# Patient Record
Sex: Female | Born: 1935 | Race: White | Hispanic: No | Smoking: Former smoker
Health system: Southern US, Community
[De-identification: ages and names within clinical notes are randomized; demographics above are authoritative.]

## PROBLEM LIST (undated history)

## (undated) DIAGNOSIS — I10 Essential (primary) hypertension: Secondary | ICD-10-CM

## (undated) DIAGNOSIS — M419 Scoliosis, unspecified: Secondary | ICD-10-CM

## (undated) DIAGNOSIS — S72142A Displaced intertrochanteric fracture of left femur, initial encounter for closed fracture: Secondary | ICD-10-CM

## (undated) DIAGNOSIS — K219 Gastro-esophageal reflux disease without esophagitis: Secondary | ICD-10-CM

## (undated) DIAGNOSIS — M810 Age-related osteoporosis without current pathological fracture: Secondary | ICD-10-CM

## (undated) HISTORY — PX: CATARACT EXTRACTION: SUR2

## (undated) HISTORY — PX: COLONOSCOPY: SHX174

## (undated) HISTORY — PX: UPPER GASTROINTESTINAL ENDOSCOPY: SHX188

## (undated) HISTORY — PX: DILATION AND CURETTAGE OF UTERUS: SHX78

## (undated) HISTORY — PX: EYE SURGERY: SHX253

## (undated) HISTORY — PX: TONSILLECTOMY: SUR1361

---

## 2002-01-08 ENCOUNTER — Encounter: Admission: RE | Admit: 2002-01-08 | Discharge: 2002-01-08 | Payer: Self-pay | Admitting: Orthopedic Surgery

## 2002-01-08 ENCOUNTER — Encounter: Payer: Self-pay | Admitting: Orthopedic Surgery

## 2002-01-18 ENCOUNTER — Encounter: Payer: Self-pay | Admitting: Orthopedic Surgery

## 2002-01-18 ENCOUNTER — Encounter: Admission: RE | Admit: 2002-01-18 | Discharge: 2002-01-18 | Payer: Self-pay | Admitting: Orthopedic Surgery

## 2002-02-05 ENCOUNTER — Encounter: Admission: RE | Admit: 2002-02-05 | Discharge: 2002-02-05 | Payer: Self-pay | Admitting: Orthopedic Surgery

## 2002-02-05 ENCOUNTER — Encounter: Payer: Self-pay | Admitting: Orthopedic Surgery

## 2015-11-30 DIAGNOSIS — H34811 Central retinal vein occlusion, right eye, with macular edema: Secondary | ICD-10-CM | POA: Diagnosis not present

## 2015-12-04 DIAGNOSIS — I10 Essential (primary) hypertension: Secondary | ICD-10-CM | POA: Diagnosis not present

## 2015-12-04 DIAGNOSIS — Z6824 Body mass index (BMI) 24.0-24.9, adult: Secondary | ICD-10-CM | POA: Diagnosis not present

## 2016-01-11 DIAGNOSIS — H34811 Central retinal vein occlusion, right eye, with macular edema: Secondary | ICD-10-CM | POA: Diagnosis not present

## 2016-02-15 DIAGNOSIS — H34811 Central retinal vein occlusion, right eye, with macular edema: Secondary | ICD-10-CM | POA: Diagnosis not present

## 2016-03-05 DIAGNOSIS — K210 Gastro-esophageal reflux disease with esophagitis: Secondary | ICD-10-CM | POA: Diagnosis not present

## 2016-03-05 DIAGNOSIS — Z6823 Body mass index (BMI) 23.0-23.9, adult: Secondary | ICD-10-CM | POA: Diagnosis not present

## 2016-03-05 DIAGNOSIS — I10 Essential (primary) hypertension: Secondary | ICD-10-CM | POA: Diagnosis not present

## 2016-03-05 DIAGNOSIS — M818 Other osteoporosis without current pathological fracture: Secondary | ICD-10-CM | POA: Diagnosis not present

## 2016-03-28 DIAGNOSIS — H34811 Central retinal vein occlusion, right eye, with macular edema: Secondary | ICD-10-CM | POA: Diagnosis not present

## 2016-05-16 DIAGNOSIS — H34811 Central retinal vein occlusion, right eye, with macular edema: Secondary | ICD-10-CM | POA: Diagnosis not present

## 2016-06-02 DIAGNOSIS — W1839XA Other fall on same level, initial encounter: Secondary | ICD-10-CM | POA: Diagnosis not present

## 2016-06-02 DIAGNOSIS — K219 Gastro-esophageal reflux disease without esophagitis: Secondary | ICD-10-CM | POA: Diagnosis not present

## 2016-06-02 DIAGNOSIS — Z79899 Other long term (current) drug therapy: Secondary | ICD-10-CM | POA: Diagnosis not present

## 2016-06-02 DIAGNOSIS — S32591A Other specified fracture of right pubis, initial encounter for closed fracture: Secondary | ICD-10-CM | POA: Diagnosis not present

## 2016-06-02 DIAGNOSIS — Z7982 Long term (current) use of aspirin: Secondary | ICD-10-CM | POA: Diagnosis not present

## 2016-06-02 DIAGNOSIS — M810 Age-related osteoporosis without current pathological fracture: Secondary | ICD-10-CM | POA: Diagnosis not present

## 2016-06-02 DIAGNOSIS — M25511 Pain in right shoulder: Secondary | ICD-10-CM | POA: Diagnosis not present

## 2016-06-02 DIAGNOSIS — F17200 Nicotine dependence, unspecified, uncomplicated: Secondary | ICD-10-CM | POA: Diagnosis not present

## 2016-06-02 DIAGNOSIS — S32512A Fracture of superior rim of left pubis, initial encounter for closed fracture: Secondary | ICD-10-CM | POA: Diagnosis not present

## 2016-06-02 DIAGNOSIS — I10 Essential (primary) hypertension: Secondary | ICD-10-CM | POA: Diagnosis not present

## 2016-06-02 DIAGNOSIS — S32511A Fracture of superior rim of right pubis, initial encounter for closed fracture: Secondary | ICD-10-CM | POA: Diagnosis not present

## 2016-06-06 DIAGNOSIS — S32591A Other specified fracture of right pubis, initial encounter for closed fracture: Secondary | ICD-10-CM | POA: Diagnosis not present

## 2016-06-06 DIAGNOSIS — I10 Essential (primary) hypertension: Secondary | ICD-10-CM | POA: Diagnosis not present

## 2016-06-06 DIAGNOSIS — K210 Gastro-esophageal reflux disease with esophagitis: Secondary | ICD-10-CM | POA: Diagnosis not present

## 2016-06-06 DIAGNOSIS — Z6823 Body mass index (BMI) 23.0-23.9, adult: Secondary | ICD-10-CM | POA: Diagnosis not present

## 2016-07-04 DIAGNOSIS — H34811 Central retinal vein occlusion, right eye, with macular edema: Secondary | ICD-10-CM | POA: Diagnosis not present

## 2016-07-18 DIAGNOSIS — Z23 Encounter for immunization: Secondary | ICD-10-CM | POA: Diagnosis not present

## 2016-08-29 DIAGNOSIS — H35351 Cystoid macular degeneration, right eye: Secondary | ICD-10-CM | POA: Diagnosis not present

## 2016-08-29 DIAGNOSIS — H34811 Central retinal vein occlusion, right eye, with macular edema: Secondary | ICD-10-CM | POA: Diagnosis not present

## 2016-09-05 DIAGNOSIS — Z1389 Encounter for screening for other disorder: Secondary | ICD-10-CM | POA: Diagnosis not present

## 2016-09-05 DIAGNOSIS — K210 Gastro-esophageal reflux disease with esophagitis: Secondary | ICD-10-CM | POA: Diagnosis not present

## 2016-09-05 DIAGNOSIS — Z6823 Body mass index (BMI) 23.0-23.9, adult: Secondary | ICD-10-CM | POA: Diagnosis not present

## 2016-09-05 DIAGNOSIS — Z0000 Encounter for general adult medical examination without abnormal findings: Secondary | ICD-10-CM | POA: Diagnosis not present

## 2016-09-05 DIAGNOSIS — M25511 Pain in right shoulder: Secondary | ICD-10-CM | POA: Diagnosis not present

## 2016-09-05 DIAGNOSIS — I10 Essential (primary) hypertension: Secondary | ICD-10-CM | POA: Diagnosis not present

## 2016-09-24 DIAGNOSIS — Z23 Encounter for immunization: Secondary | ICD-10-CM | POA: Diagnosis not present

## 2016-10-31 DIAGNOSIS — H34811 Central retinal vein occlusion, right eye, with macular edema: Secondary | ICD-10-CM | POA: Diagnosis not present

## 2016-12-05 DIAGNOSIS — Z6823 Body mass index (BMI) 23.0-23.9, adult: Secondary | ICD-10-CM | POA: Diagnosis not present

## 2016-12-05 DIAGNOSIS — I10 Essential (primary) hypertension: Secondary | ICD-10-CM | POA: Diagnosis not present

## 2016-12-05 DIAGNOSIS — K210 Gastro-esophageal reflux disease with esophagitis: Secondary | ICD-10-CM | POA: Diagnosis not present

## 2016-12-05 DIAGNOSIS — M25511 Pain in right shoulder: Secondary | ICD-10-CM | POA: Diagnosis not present

## 2017-01-02 DIAGNOSIS — H34811 Central retinal vein occlusion, right eye, with macular edema: Secondary | ICD-10-CM | POA: Diagnosis not present

## 2017-02-12 DIAGNOSIS — M25511 Pain in right shoulder: Secondary | ICD-10-CM | POA: Diagnosis not present

## 2017-02-27 DIAGNOSIS — H26491 Other secondary cataract, right eye: Secondary | ICD-10-CM | POA: Diagnosis not present

## 2017-02-27 DIAGNOSIS — H353132 Nonexudative age-related macular degeneration, bilateral, intermediate dry stage: Secondary | ICD-10-CM | POA: Diagnosis not present

## 2017-02-27 DIAGNOSIS — H348112 Central retinal vein occlusion, right eye, stable: Secondary | ICD-10-CM | POA: Diagnosis not present

## 2017-02-27 DIAGNOSIS — H35351 Cystoid macular degeneration, right eye: Secondary | ICD-10-CM | POA: Diagnosis not present

## 2017-03-07 DIAGNOSIS — I10 Essential (primary) hypertension: Secondary | ICD-10-CM | POA: Diagnosis not present

## 2017-03-07 DIAGNOSIS — M25511 Pain in right shoulder: Secondary | ICD-10-CM | POA: Diagnosis not present

## 2017-03-07 DIAGNOSIS — Z6823 Body mass index (BMI) 23.0-23.9, adult: Secondary | ICD-10-CM | POA: Diagnosis not present

## 2017-03-07 DIAGNOSIS — K210 Gastro-esophageal reflux disease with esophagitis: Secondary | ICD-10-CM | POA: Diagnosis not present

## 2017-03-24 DIAGNOSIS — M19012 Primary osteoarthritis, left shoulder: Secondary | ICD-10-CM | POA: Diagnosis not present

## 2017-03-24 DIAGNOSIS — M19011 Primary osteoarthritis, right shoulder: Secondary | ICD-10-CM | POA: Diagnosis not present

## 2017-05-29 DIAGNOSIS — H353132 Nonexudative age-related macular degeneration, bilateral, intermediate dry stage: Secondary | ICD-10-CM | POA: Diagnosis not present

## 2017-05-29 DIAGNOSIS — H35351 Cystoid macular degeneration, right eye: Secondary | ICD-10-CM | POA: Diagnosis not present

## 2017-05-29 DIAGNOSIS — H348112 Central retinal vein occlusion, right eye, stable: Secondary | ICD-10-CM | POA: Diagnosis not present

## 2017-05-29 DIAGNOSIS — H26491 Other secondary cataract, right eye: Secondary | ICD-10-CM | POA: Diagnosis not present

## 2017-06-06 DIAGNOSIS — M25512 Pain in left shoulder: Secondary | ICD-10-CM | POA: Diagnosis not present

## 2017-06-06 DIAGNOSIS — K210 Gastro-esophageal reflux disease with esophagitis: Secondary | ICD-10-CM | POA: Diagnosis not present

## 2017-06-06 DIAGNOSIS — Z6824 Body mass index (BMI) 24.0-24.9, adult: Secondary | ICD-10-CM | POA: Diagnosis not present

## 2017-06-06 DIAGNOSIS — I10 Essential (primary) hypertension: Secondary | ICD-10-CM | POA: Diagnosis not present

## 2017-06-19 DIAGNOSIS — H35351 Cystoid macular degeneration, right eye: Secondary | ICD-10-CM | POA: Diagnosis not present

## 2017-06-19 DIAGNOSIS — H353132 Nonexudative age-related macular degeneration, bilateral, intermediate dry stage: Secondary | ICD-10-CM | POA: Diagnosis not present

## 2017-06-19 DIAGNOSIS — H26491 Other secondary cataract, right eye: Secondary | ICD-10-CM | POA: Diagnosis not present

## 2017-06-19 DIAGNOSIS — H34811 Central retinal vein occlusion, right eye, with macular edema: Secondary | ICD-10-CM | POA: Diagnosis not present

## 2017-07-31 DIAGNOSIS — H353132 Nonexudative age-related macular degeneration, bilateral, intermediate dry stage: Secondary | ICD-10-CM | POA: Diagnosis not present

## 2017-07-31 DIAGNOSIS — H35351 Cystoid macular degeneration, right eye: Secondary | ICD-10-CM | POA: Diagnosis not present

## 2017-07-31 DIAGNOSIS — H34811 Central retinal vein occlusion, right eye, with macular edema: Secondary | ICD-10-CM | POA: Diagnosis not present

## 2017-09-04 DIAGNOSIS — K210 Gastro-esophageal reflux disease with esophagitis: Secondary | ICD-10-CM | POA: Diagnosis not present

## 2017-09-04 DIAGNOSIS — I10 Essential (primary) hypertension: Secondary | ICD-10-CM | POA: Diagnosis not present

## 2017-09-04 DIAGNOSIS — M25511 Pain in right shoulder: Secondary | ICD-10-CM | POA: Diagnosis not present

## 2017-09-04 DIAGNOSIS — M25512 Pain in left shoulder: Secondary | ICD-10-CM | POA: Diagnosis not present

## 2017-09-25 DIAGNOSIS — H3561 Retinal hemorrhage, right eye: Secondary | ICD-10-CM | POA: Diagnosis not present

## 2017-09-25 DIAGNOSIS — H353132 Nonexudative age-related macular degeneration, bilateral, intermediate dry stage: Secondary | ICD-10-CM | POA: Diagnosis not present

## 2017-09-25 DIAGNOSIS — H35351 Cystoid macular degeneration, right eye: Secondary | ICD-10-CM | POA: Diagnosis not present

## 2017-09-25 DIAGNOSIS — H34811 Central retinal vein occlusion, right eye, with macular edema: Secondary | ICD-10-CM | POA: Diagnosis not present

## 2017-10-01 DIAGNOSIS — Z23 Encounter for immunization: Secondary | ICD-10-CM | POA: Diagnosis not present

## 2017-11-06 DIAGNOSIS — H3561 Retinal hemorrhage, right eye: Secondary | ICD-10-CM | POA: Diagnosis not present

## 2017-11-06 DIAGNOSIS — H34811 Central retinal vein occlusion, right eye, with macular edema: Secondary | ICD-10-CM | POA: Diagnosis not present

## 2017-11-06 DIAGNOSIS — H35351 Cystoid macular degeneration, right eye: Secondary | ICD-10-CM | POA: Diagnosis not present

## 2017-12-10 DIAGNOSIS — K210 Gastro-esophageal reflux disease with esophagitis: Secondary | ICD-10-CM | POA: Diagnosis not present

## 2017-12-10 DIAGNOSIS — I10 Essential (primary) hypertension: Secondary | ICD-10-CM | POA: Diagnosis not present

## 2017-12-10 DIAGNOSIS — M25512 Pain in left shoulder: Secondary | ICD-10-CM | POA: Diagnosis not present

## 2017-12-10 DIAGNOSIS — M810 Age-related osteoporosis without current pathological fracture: Secondary | ICD-10-CM | POA: Diagnosis not present

## 2017-12-18 DIAGNOSIS — H35351 Cystoid macular degeneration, right eye: Secondary | ICD-10-CM | POA: Diagnosis not present

## 2017-12-18 DIAGNOSIS — H34811 Central retinal vein occlusion, right eye, with macular edema: Secondary | ICD-10-CM | POA: Diagnosis not present

## 2017-12-18 DIAGNOSIS — H3561 Retinal hemorrhage, right eye: Secondary | ICD-10-CM | POA: Diagnosis not present

## 2017-12-18 DIAGNOSIS — H353132 Nonexudative age-related macular degeneration, bilateral, intermediate dry stage: Secondary | ICD-10-CM | POA: Diagnosis not present

## 2018-01-22 DIAGNOSIS — M85851 Other specified disorders of bone density and structure, right thigh: Secondary | ICD-10-CM | POA: Diagnosis not present

## 2018-02-05 DIAGNOSIS — H348112 Central retinal vein occlusion, right eye, stable: Secondary | ICD-10-CM | POA: Diagnosis not present

## 2018-02-05 DIAGNOSIS — H35351 Cystoid macular degeneration, right eye: Secondary | ICD-10-CM | POA: Diagnosis not present

## 2018-02-05 DIAGNOSIS — H353132 Nonexudative age-related macular degeneration, bilateral, intermediate dry stage: Secondary | ICD-10-CM | POA: Diagnosis not present

## 2018-02-05 DIAGNOSIS — Z87891 Personal history of nicotine dependence: Secondary | ICD-10-CM | POA: Diagnosis not present

## 2018-02-12 DIAGNOSIS — Z1231 Encounter for screening mammogram for malignant neoplasm of breast: Secondary | ICD-10-CM | POA: Diagnosis not present

## 2018-03-12 DIAGNOSIS — K210 Gastro-esophageal reflux disease with esophagitis: Secondary | ICD-10-CM | POA: Diagnosis not present

## 2018-03-12 DIAGNOSIS — I10 Essential (primary) hypertension: Secondary | ICD-10-CM | POA: Diagnosis not present

## 2018-03-12 DIAGNOSIS — M25512 Pain in left shoulder: Secondary | ICD-10-CM | POA: Diagnosis not present

## 2018-03-12 DIAGNOSIS — M810 Age-related osteoporosis without current pathological fracture: Secondary | ICD-10-CM | POA: Diagnosis not present

## 2018-03-19 DIAGNOSIS — H35351 Cystoid macular degeneration, right eye: Secondary | ICD-10-CM | POA: Diagnosis not present

## 2018-03-19 DIAGNOSIS — H34811 Central retinal vein occlusion, right eye, with macular edema: Secondary | ICD-10-CM | POA: Diagnosis not present

## 2018-03-19 DIAGNOSIS — H3561 Retinal hemorrhage, right eye: Secondary | ICD-10-CM | POA: Diagnosis not present

## 2018-05-07 DIAGNOSIS — H34811 Central retinal vein occlusion, right eye, with macular edema: Secondary | ICD-10-CM | POA: Diagnosis not present

## 2018-05-07 DIAGNOSIS — H3561 Retinal hemorrhage, right eye: Secondary | ICD-10-CM | POA: Diagnosis not present

## 2018-05-07 DIAGNOSIS — H35351 Cystoid macular degeneration, right eye: Secondary | ICD-10-CM | POA: Diagnosis not present

## 2018-05-07 DIAGNOSIS — H353132 Nonexudative age-related macular degeneration, bilateral, intermediate dry stage: Secondary | ICD-10-CM | POA: Diagnosis not present

## 2018-06-11 DIAGNOSIS — I10 Essential (primary) hypertension: Secondary | ICD-10-CM | POA: Diagnosis not present

## 2018-06-11 DIAGNOSIS — Z6824 Body mass index (BMI) 24.0-24.9, adult: Secondary | ICD-10-CM | POA: Diagnosis not present

## 2018-06-11 DIAGNOSIS — Z0000 Encounter for general adult medical examination without abnormal findings: Secondary | ICD-10-CM | POA: Diagnosis not present

## 2018-06-11 DIAGNOSIS — Z6823 Body mass index (BMI) 23.0-23.9, adult: Secondary | ICD-10-CM | POA: Diagnosis not present

## 2018-06-11 DIAGNOSIS — M810 Age-related osteoporosis without current pathological fracture: Secondary | ICD-10-CM | POA: Diagnosis not present

## 2018-07-02 DIAGNOSIS — H3561 Retinal hemorrhage, right eye: Secondary | ICD-10-CM | POA: Diagnosis not present

## 2018-07-02 DIAGNOSIS — H35351 Cystoid macular degeneration, right eye: Secondary | ICD-10-CM | POA: Diagnosis not present

## 2018-07-02 DIAGNOSIS — H34811 Central retinal vein occlusion, right eye, with macular edema: Secondary | ICD-10-CM | POA: Diagnosis not present

## 2018-07-10 DIAGNOSIS — Z23 Encounter for immunization: Secondary | ICD-10-CM | POA: Diagnosis not present

## 2018-09-03 DIAGNOSIS — H35351 Cystoid macular degeneration, right eye: Secondary | ICD-10-CM | POA: Diagnosis not present

## 2018-09-03 DIAGNOSIS — H34811 Central retinal vein occlusion, right eye, with macular edema: Secondary | ICD-10-CM | POA: Diagnosis not present

## 2018-09-03 DIAGNOSIS — H3561 Retinal hemorrhage, right eye: Secondary | ICD-10-CM | POA: Diagnosis not present

## 2018-09-03 DIAGNOSIS — H353132 Nonexudative age-related macular degeneration, bilateral, intermediate dry stage: Secondary | ICD-10-CM | POA: Diagnosis not present

## 2018-09-10 DIAGNOSIS — Z6823 Body mass index (BMI) 23.0-23.9, adult: Secondary | ICD-10-CM | POA: Diagnosis not present

## 2018-09-10 DIAGNOSIS — S46812A Strain of other muscles, fascia and tendons at shoulder and upper arm level, left arm, initial encounter: Secondary | ICD-10-CM | POA: Diagnosis not present

## 2018-09-10 DIAGNOSIS — Z6824 Body mass index (BMI) 24.0-24.9, adult: Secondary | ICD-10-CM | POA: Diagnosis not present

## 2018-09-10 DIAGNOSIS — I10 Essential (primary) hypertension: Secondary | ICD-10-CM | POA: Diagnosis not present

## 2018-09-10 DIAGNOSIS — R5383 Other fatigue: Secondary | ICD-10-CM | POA: Diagnosis not present

## 2018-09-10 DIAGNOSIS — Z0000 Encounter for general adult medical examination without abnormal findings: Secondary | ICD-10-CM | POA: Diagnosis not present

## 2018-10-25 DIAGNOSIS — S0990XA Unspecified injury of head, initial encounter: Secondary | ICD-10-CM | POA: Diagnosis not present

## 2018-10-25 DIAGNOSIS — M419 Scoliosis, unspecified: Secondary | ICD-10-CM | POA: Diagnosis not present

## 2018-10-25 DIAGNOSIS — W010XXA Fall on same level from slipping, tripping and stumbling without subsequent striking against object, initial encounter: Secondary | ICD-10-CM | POA: Diagnosis not present

## 2018-10-25 DIAGNOSIS — W01198A Fall on same level from slipping, tripping and stumbling with subsequent striking against other object, initial encounter: Secondary | ICD-10-CM | POA: Diagnosis not present

## 2018-10-25 DIAGNOSIS — K219 Gastro-esophageal reflux disease without esophagitis: Secondary | ICD-10-CM | POA: Diagnosis not present

## 2018-10-25 DIAGNOSIS — R0789 Other chest pain: Secondary | ICD-10-CM | POA: Diagnosis not present

## 2018-10-25 DIAGNOSIS — R531 Weakness: Secondary | ICD-10-CM | POA: Diagnosis not present

## 2018-10-25 DIAGNOSIS — R0602 Shortness of breath: Secondary | ICD-10-CM | POA: Diagnosis not present

## 2018-10-25 DIAGNOSIS — K449 Diaphragmatic hernia without obstruction or gangrene: Secondary | ICD-10-CM | POA: Diagnosis not present

## 2018-10-25 DIAGNOSIS — S199XXA Unspecified injury of neck, initial encounter: Secondary | ICD-10-CM | POA: Diagnosis not present

## 2018-10-25 DIAGNOSIS — G8929 Other chronic pain: Secondary | ICD-10-CM | POA: Diagnosis not present

## 2018-10-25 DIAGNOSIS — R51 Headache: Secondary | ICD-10-CM | POA: Diagnosis not present

## 2018-10-25 DIAGNOSIS — Y92481 Parking lot as the place of occurrence of the external cause: Secondary | ICD-10-CM | POA: Diagnosis not present

## 2018-10-25 DIAGNOSIS — M810 Age-related osteoporosis without current pathological fracture: Secondary | ICD-10-CM | POA: Diagnosis not present

## 2018-10-25 DIAGNOSIS — Z780 Asymptomatic menopausal state: Secondary | ICD-10-CM | POA: Diagnosis not present

## 2018-10-25 DIAGNOSIS — M25511 Pain in right shoulder: Secondary | ICD-10-CM | POA: Diagnosis not present

## 2018-10-25 DIAGNOSIS — S40012A Contusion of left shoulder, initial encounter: Secondary | ICD-10-CM | POA: Diagnosis not present

## 2018-10-25 DIAGNOSIS — M545 Low back pain: Secondary | ICD-10-CM | POA: Diagnosis not present

## 2018-10-25 DIAGNOSIS — R079 Chest pain, unspecified: Secondary | ICD-10-CM | POA: Diagnosis not present

## 2018-10-25 DIAGNOSIS — M25512 Pain in left shoulder: Secondary | ICD-10-CM | POA: Diagnosis not present

## 2018-10-25 DIAGNOSIS — I10 Essential (primary) hypertension: Secondary | ICD-10-CM | POA: Diagnosis not present

## 2018-10-25 DIAGNOSIS — S299XXA Unspecified injury of thorax, initial encounter: Secondary | ICD-10-CM | POA: Diagnosis not present

## 2018-10-25 DIAGNOSIS — S2242XA Multiple fractures of ribs, left side, initial encounter for closed fracture: Secondary | ICD-10-CM | POA: Diagnosis not present

## 2018-10-25 DIAGNOSIS — Z79899 Other long term (current) drug therapy: Secondary | ICD-10-CM | POA: Diagnosis not present

## 2018-10-26 DIAGNOSIS — W010XXA Fall on same level from slipping, tripping and stumbling without subsequent striking against object, initial encounter: Secondary | ICD-10-CM | POA: Diagnosis not present

## 2018-10-26 DIAGNOSIS — Y92481 Parking lot as the place of occurrence of the external cause: Secondary | ICD-10-CM | POA: Diagnosis not present

## 2018-10-26 DIAGNOSIS — S2242XA Multiple fractures of ribs, left side, initial encounter for closed fracture: Secondary | ICD-10-CM | POA: Diagnosis not present

## 2018-10-26 DIAGNOSIS — I10 Essential (primary) hypertension: Secondary | ICD-10-CM | POA: Diagnosis not present

## 2018-11-02 DIAGNOSIS — Z6824 Body mass index (BMI) 24.0-24.9, adult: Secondary | ICD-10-CM | POA: Diagnosis not present

## 2018-11-02 DIAGNOSIS — S2243XA Multiple fractures of ribs, bilateral, initial encounter for closed fracture: Secondary | ICD-10-CM | POA: Diagnosis not present

## 2018-11-10 DIAGNOSIS — H3561 Retinal hemorrhage, right eye: Secondary | ICD-10-CM | POA: Diagnosis not present

## 2018-11-10 DIAGNOSIS — H35351 Cystoid macular degeneration, right eye: Secondary | ICD-10-CM | POA: Diagnosis not present

## 2018-11-10 DIAGNOSIS — H34811 Central retinal vein occlusion, right eye, with macular edema: Secondary | ICD-10-CM | POA: Diagnosis not present

## 2018-12-10 DIAGNOSIS — I10 Essential (primary) hypertension: Secondary | ICD-10-CM | POA: Diagnosis not present

## 2018-12-10 DIAGNOSIS — M19019 Primary osteoarthritis, unspecified shoulder: Secondary | ICD-10-CM | POA: Diagnosis not present

## 2018-12-10 DIAGNOSIS — Z6824 Body mass index (BMI) 24.0-24.9, adult: Secondary | ICD-10-CM | POA: Diagnosis not present

## 2019-02-04 DIAGNOSIS — H34811 Central retinal vein occlusion, right eye, with macular edema: Secondary | ICD-10-CM | POA: Diagnosis not present

## 2019-02-04 DIAGNOSIS — H35351 Cystoid macular degeneration, right eye: Secondary | ICD-10-CM | POA: Diagnosis not present

## 2019-03-11 DIAGNOSIS — M19019 Primary osteoarthritis, unspecified shoulder: Secondary | ICD-10-CM | POA: Diagnosis not present

## 2019-03-11 DIAGNOSIS — M818 Other osteoporosis without current pathological fracture: Secondary | ICD-10-CM | POA: Diagnosis not present

## 2019-03-11 DIAGNOSIS — I10 Essential (primary) hypertension: Secondary | ICD-10-CM | POA: Diagnosis not present

## 2019-03-11 DIAGNOSIS — J449 Chronic obstructive pulmonary disease, unspecified: Secondary | ICD-10-CM | POA: Diagnosis not present

## 2019-03-25 DIAGNOSIS — H34811 Central retinal vein occlusion, right eye, with macular edema: Secondary | ICD-10-CM | POA: Diagnosis not present

## 2019-03-25 DIAGNOSIS — H353132 Nonexudative age-related macular degeneration, bilateral, intermediate dry stage: Secondary | ICD-10-CM | POA: Diagnosis not present

## 2019-03-25 DIAGNOSIS — H3561 Retinal hemorrhage, right eye: Secondary | ICD-10-CM | POA: Diagnosis not present

## 2019-03-25 DIAGNOSIS — H35351 Cystoid macular degeneration, right eye: Secondary | ICD-10-CM | POA: Diagnosis not present

## 2019-05-13 DIAGNOSIS — H35351 Cystoid macular degeneration, right eye: Secondary | ICD-10-CM | POA: Diagnosis not present

## 2019-05-13 DIAGNOSIS — H34811 Central retinal vein occlusion, right eye, with macular edema: Secondary | ICD-10-CM | POA: Diagnosis not present

## 2019-05-13 DIAGNOSIS — H3561 Retinal hemorrhage, right eye: Secondary | ICD-10-CM | POA: Diagnosis not present

## 2019-05-13 DIAGNOSIS — H35361 Drusen (degenerative) of macula, right eye: Secondary | ICD-10-CM | POA: Diagnosis not present

## 2019-06-10 DIAGNOSIS — M818 Other osteoporosis without current pathological fracture: Secondary | ICD-10-CM | POA: Diagnosis not present

## 2019-06-10 DIAGNOSIS — N182 Chronic kidney disease, stage 2 (mild): Secondary | ICD-10-CM | POA: Diagnosis not present

## 2019-06-10 DIAGNOSIS — I10 Essential (primary) hypertension: Secondary | ICD-10-CM | POA: Diagnosis not present

## 2019-06-10 DIAGNOSIS — J449 Chronic obstructive pulmonary disease, unspecified: Secondary | ICD-10-CM | POA: Diagnosis not present

## 2019-07-01 DIAGNOSIS — H34811 Central retinal vein occlusion, right eye, with macular edema: Secondary | ICD-10-CM | POA: Diagnosis not present

## 2019-07-01 DIAGNOSIS — H353132 Nonexudative age-related macular degeneration, bilateral, intermediate dry stage: Secondary | ICD-10-CM | POA: Diagnosis not present

## 2019-07-01 DIAGNOSIS — H43391 Other vitreous opacities, right eye: Secondary | ICD-10-CM | POA: Diagnosis not present

## 2019-07-01 DIAGNOSIS — H35351 Cystoid macular degeneration, right eye: Secondary | ICD-10-CM | POA: Diagnosis not present

## 2019-07-03 ENCOUNTER — Emergency Department (HOSPITAL_COMMUNITY): Payer: Medicare Other

## 2019-07-03 ENCOUNTER — Other Ambulatory Visit: Payer: Self-pay

## 2019-07-03 ENCOUNTER — Encounter (HOSPITAL_COMMUNITY): Payer: Self-pay

## 2019-07-03 ENCOUNTER — Inpatient Hospital Stay (HOSPITAL_COMMUNITY)
Admission: EM | Admit: 2019-07-03 | Discharge: 2019-07-08 | DRG: 956 | Disposition: A | Discharge status: Skilled Nursing Facility | Payer: Medicare Other | Source: Ambulatory Visit | Attending: Internal Medicine | Admitting: Internal Medicine

## 2019-07-03 DIAGNOSIS — S72142A Displaced intertrochanteric fracture of left femur, initial encounter for closed fracture: Principal | ICD-10-CM | POA: Diagnosis present

## 2019-07-03 DIAGNOSIS — S72145A Nondisplaced intertrochanteric fracture of left femur, initial encounter for closed fracture: Secondary | ICD-10-CM | POA: Diagnosis not present

## 2019-07-03 DIAGNOSIS — S32512A Fracture of superior rim of left pubis, initial encounter for closed fracture: Secondary | ICD-10-CM

## 2019-07-03 DIAGNOSIS — Z743 Need for continuous supervision: Secondary | ICD-10-CM | POA: Diagnosis not present

## 2019-07-03 DIAGNOSIS — M419 Scoliosis, unspecified: Secondary | ICD-10-CM | POA: Diagnosis not present

## 2019-07-03 DIAGNOSIS — R2689 Other abnormalities of gait and mobility: Secondary | ICD-10-CM | POA: Diagnosis not present

## 2019-07-03 DIAGNOSIS — M25562 Pain in left knee: Secondary | ICD-10-CM | POA: Diagnosis not present

## 2019-07-03 DIAGNOSIS — Z87891 Personal history of nicotine dependence: Secondary | ICD-10-CM | POA: Diagnosis not present

## 2019-07-03 DIAGNOSIS — R5381 Other malaise: Secondary | ICD-10-CM | POA: Diagnosis not present

## 2019-07-03 DIAGNOSIS — M810 Age-related osteoporosis without current pathological fracture: Secondary | ICD-10-CM | POA: Diagnosis present

## 2019-07-03 DIAGNOSIS — N289 Disorder of kidney and ureter, unspecified: Secondary | ICD-10-CM | POA: Diagnosis not present

## 2019-07-03 DIAGNOSIS — I10 Essential (primary) hypertension: Secondary | ICD-10-CM | POA: Diagnosis present

## 2019-07-03 DIAGNOSIS — Y92039 Unspecified place in apartment as the place of occurrence of the external cause: Secondary | ICD-10-CM | POA: Diagnosis not present

## 2019-07-03 DIAGNOSIS — S32592A Other specified fracture of left pubis, initial encounter for closed fracture: Secondary | ICD-10-CM | POA: Diagnosis not present

## 2019-07-03 DIAGNOSIS — E871 Hypo-osmolality and hyponatremia: Secondary | ICD-10-CM | POA: Diagnosis present

## 2019-07-03 DIAGNOSIS — W010XXA Fall on same level from slipping, tripping and stumbling without subsequent striking against object, initial encounter: Secondary | ICD-10-CM | POA: Diagnosis present

## 2019-07-03 DIAGNOSIS — Z03818 Encounter for observation for suspected exposure to other biological agents ruled out: Secondary | ICD-10-CM | POA: Diagnosis not present

## 2019-07-03 DIAGNOSIS — Z20828 Contact with and (suspected) exposure to other viral communicable diseases: Secondary | ICD-10-CM | POA: Diagnosis present

## 2019-07-03 DIAGNOSIS — R41841 Cognitive communication deficit: Secondary | ICD-10-CM | POA: Diagnosis not present

## 2019-07-03 DIAGNOSIS — E861 Hypovolemia: Secondary | ICD-10-CM | POA: Diagnosis not present

## 2019-07-03 DIAGNOSIS — K219 Gastro-esophageal reflux disease without esophagitis: Secondary | ICD-10-CM | POA: Diagnosis not present

## 2019-07-03 DIAGNOSIS — Z79899 Other long term (current) drug therapy: Secondary | ICD-10-CM

## 2019-07-03 DIAGNOSIS — Z419 Encounter for procedure for purposes other than remedying health state, unspecified: Secondary | ICD-10-CM

## 2019-07-03 DIAGNOSIS — S79929A Unspecified injury of unspecified thigh, initial encounter: Secondary | ICD-10-CM | POA: Diagnosis not present

## 2019-07-03 DIAGNOSIS — S72002A Fracture of unspecified part of neck of left femur, initial encounter for closed fracture: Secondary | ICD-10-CM

## 2019-07-03 DIAGNOSIS — D62 Acute posthemorrhagic anemia: Secondary | ICD-10-CM | POA: Diagnosis not present

## 2019-07-03 DIAGNOSIS — Y93E8 Activity, other personal hygiene: Secondary | ICD-10-CM

## 2019-07-03 DIAGNOSIS — R279 Unspecified lack of coordination: Secondary | ICD-10-CM | POA: Diagnosis not present

## 2019-07-03 DIAGNOSIS — Z4789 Encounter for other orthopedic aftercare: Secondary | ICD-10-CM | POA: Diagnosis not present

## 2019-07-03 DIAGNOSIS — Z7983 Long term (current) use of bisphosphonates: Secondary | ICD-10-CM | POA: Diagnosis not present

## 2019-07-03 DIAGNOSIS — Z886 Allergy status to analgesic agent status: Secondary | ICD-10-CM | POA: Diagnosis not present

## 2019-07-03 DIAGNOSIS — M79662 Pain in left lower leg: Secondary | ICD-10-CM | POA: Diagnosis not present

## 2019-07-03 DIAGNOSIS — S2243XD Multiple fractures of ribs, bilateral, subsequent encounter for fracture with routine healing: Secondary | ICD-10-CM | POA: Diagnosis not present

## 2019-07-03 DIAGNOSIS — Z01818 Encounter for other preprocedural examination: Secondary | ICD-10-CM | POA: Diagnosis not present

## 2019-07-03 DIAGNOSIS — W19XXXA Unspecified fall, initial encounter: Secondary | ICD-10-CM | POA: Diagnosis not present

## 2019-07-03 DIAGNOSIS — S72092D Other fracture of head and neck of left femur, subsequent encounter for closed fracture with routine healing: Secondary | ICD-10-CM | POA: Diagnosis not present

## 2019-07-03 DIAGNOSIS — R52 Pain, unspecified: Secondary | ICD-10-CM

## 2019-07-03 DIAGNOSIS — S79912A Unspecified injury of left hip, initial encounter: Secondary | ICD-10-CM | POA: Diagnosis not present

## 2019-07-03 DIAGNOSIS — Z9181 History of falling: Secondary | ICD-10-CM | POA: Diagnosis not present

## 2019-07-03 DIAGNOSIS — S72142D Displaced intertrochanteric fracture of left femur, subsequent encounter for closed fracture with routine healing: Secondary | ICD-10-CM | POA: Diagnosis not present

## 2019-07-03 HISTORY — DX: Age-related osteoporosis without current pathological fracture: M81.0

## 2019-07-03 HISTORY — DX: Essential (primary) hypertension: I10

## 2019-07-03 HISTORY — DX: Scoliosis, unspecified: M41.9

## 2019-07-03 HISTORY — DX: Displaced intertrochanteric fracture of left femur, initial encounter for closed fracture: S72.142A

## 2019-07-03 HISTORY — DX: Gastro-esophageal reflux disease without esophagitis: K21.9

## 2019-07-03 LAB — CBC WITH DIFFERENTIAL/PLATELET
Abs Immature Granulocytes: 0.04 10*3/uL (ref 0.00–0.07)
Basophils Absolute: 0 10*3/uL (ref 0.0–0.1)
Basophils Relative: 0 %
Eosinophils Absolute: 0 10*3/uL (ref 0.0–0.5)
Eosinophils Relative: 0 %
HCT: 37.8 % (ref 36.0–46.0)
Hemoglobin: 12.8 g/dL (ref 12.0–15.0)
Immature Granulocytes: 0 %
Lymphocytes Relative: 6 %
Lymphs Abs: 0.7 10*3/uL (ref 0.7–4.0)
MCH: 32.5 pg (ref 26.0–34.0)
MCHC: 33.9 g/dL (ref 30.0–36.0)
MCV: 95.9 fL (ref 80.0–100.0)
Monocytes Absolute: 0.9 10*3/uL (ref 0.1–1.0)
Monocytes Relative: 8 %
Neutro Abs: 8.7 10*3/uL — ABNORMAL HIGH (ref 1.7–7.7)
Neutrophils Relative %: 86 %
Platelets: 177 10*3/uL (ref 150–400)
RBC: 3.94 MIL/uL (ref 3.87–5.11)
RDW: 14.2 % (ref 11.5–15.5)
WBC: 10.3 10*3/uL (ref 4.0–10.5)
nRBC: 0 % (ref 0.0–0.2)

## 2019-07-03 LAB — COMPREHENSIVE METABOLIC PANEL
ALT: 12 U/L (ref 0–44)
AST: 26 U/L (ref 15–41)
Albumin: 3.6 g/dL (ref 3.5–5.0)
Alkaline Phosphatase: 45 U/L (ref 38–126)
Anion gap: 11 (ref 5–15)
BUN: 11 mg/dL (ref 8–23)
CO2: 24 mmol/L (ref 22–32)
Calcium: 9.3 mg/dL (ref 8.9–10.3)
Chloride: 95 mmol/L — ABNORMAL LOW (ref 98–111)
Creatinine, Ser: 1.09 mg/dL — ABNORMAL HIGH (ref 0.44–1.00)
GFR calc Af Amer: 53 mL/min — ABNORMAL LOW (ref >60)
GFR calc non Af Amer: 46 mL/min — ABNORMAL LOW (ref >60)
Glucose, Bld: 126 mg/dL — ABNORMAL HIGH (ref 70–99)
Potassium: 4.6 mmol/L (ref 3.5–5.1)
Sodium: 130 mmol/L — ABNORMAL LOW (ref 135–145)
Total Bilirubin: 1.9 mg/dL — ABNORMAL HIGH (ref 0.3–1.2)
Total Protein: 6 g/dL — ABNORMAL LOW (ref 6.5–8.1)

## 2019-07-03 LAB — PROTIME-INR
INR: 1 (ref 0.8–1.2)
Prothrombin Time: 12.6 seconds (ref 11.4–15.2)

## 2019-07-03 NOTE — ED Provider Notes (Signed)
[facility] EMERGENCY DEPARTMENT Provider Note   CSN: [id number] Arrival date & time: 07/03/19  2133     History   Chief Complaint Chief Complaint  Patient presents with   Fall   Hip Pain    Left Hip    HPI Brenda Reynolds is a 85 y.o. female.     Patient with history of osteoporosis presents the emergency department from [facility] with left hip fracture.  Patient states that she fell while getting ready to go out with a friend around noon today.  She fell onto her left hip and has pain.  She was treated with Tylenol and morphine prior to arrival.  Pain is controlled.  She does not think she hit her head.  Denies headache, neck pain, confusion, vomiting.  Staff at outside emergency department discussed with Dr. Linna Caprice and patient was transferred for surgical repair.  At time of ED evaluation here, patient is comfortable.  She has no other complaints other than some discomfort in her hip area.  Onset of symptoms acute.  Course is constant.  Pain is worse with movement.     Past Medical History:  Diagnosis Date   GERD (gastroesophageal reflux disease)    Hypertension    Osteoporosis    Scoliosis     There are no active problems to display for this patient.   Past Surgical History:  Procedure Laterality Date   CATARACT EXTRACTION     TONSILLECTOMY       OB History   No obstetric history on file.      Home Medications    Prior to Admission medications   Medication Sig Start Date End Date Taking? Authorizing Provider  acetaminophen (TYLENOL) 325 MG tablet Take 650 mg by mouth 3 (three) times daily.   Yes [provider]  alendronate (FOSAMAX) 70 MG tablet Take 70 mg by mouth every Tuesday. 04/30/19  Yes [provider]  calcium-vitamin D (OSCAL WITH D) 500-200 MG-UNIT tablet Take 1 tablet by mouth 3 (three) times daily.   Yes [provider]  famotidine (PEPCID) 40 MG tablet Take 20 mg by mouth daily.  02/08/19  Yes [provider]  folic acid (FOLVITE) 400 MCG tablet Take 400 mcg by mouth daily.   Yes [provider]  gabapentin (NEURONTIN) 300 MG capsule Take 300 mg by mouth 3 (three) times daily. 04/21/19  Yes [provider]  glucosamine-chondroitin 500-400 MG tablet Take 1 tablet by mouth daily.   Yes [provider]  losartan (COZAAR) 100 MG tablet Take 100 mg by mouth daily. 05/05/19  Yes [provider]  Omega-3 Fatty Acids (FISH OIL) 1000 MG CAPS Take 2,000 mg by mouth daily.   Yes [provider]  vitamin B-12 (CYANOCOBALAMIN) 500 MCG tablet Take 500 mcg by mouth daily.   Yes [provider]  VITAMIN E PO Take 1 capsule by mouth daily.   Yes [provider]  VITAMIN K PO Take 1 tablet by mouth daily.   Yes [provider]    Family History Family History  Problem Relation Age of Onset   Cancer Mother     Social History Social History   Tobacco Use   Smoking status: Former Smoker   Smokeless tobacco: Never Used  Substance Use Topics   Alcohol use: Yes    Alcohol/week: 1.0 standard drinks    Types: 1 Glasses of wine per week   Drug use: Yes    Types: Cocaine  Allergies   Lidocaine   Review of Systems Review of Systems  Constitutional: Negative for fever.  HENT: Negative for rhinorrhea and sore throat.   Eyes: Negative for redness.  Respiratory: Negative for cough.   Cardiovascular: Negative for chest pain.  Gastrointestinal: Negative for abdominal pain, diarrhea, nausea and vomiting.  Genitourinary: Negative for dysuria.  Musculoskeletal: Positive for arthralgias and gait problem. Negative for back pain, myalgias and neck pain.  Skin: Negative for rash.  Neurological: Negative for headaches.     Physical Exam Updated Vital Signs BP (!) 117/99 (BP Location: Left Arm)    Pulse 91    Temp 98 F (36.7 C) (Oral)    Resp 18    Ht 5' (1.524 m)    Wt 58.5 kg    SpO2 96%    BMI  25.19 kg/m   Physical Exam Vitals signs and nursing note reviewed.  Constitutional:      Appearance: She is well-developed.  HENT:     Head: Normocephalic and atraumatic.  Eyes:     General:        Right eye: No discharge.        Left eye: No discharge.     Conjunctiva/sclera: Conjunctivae normal.  Neck:     Musculoskeletal: Normal range of motion and neck supple.  Cardiovascular:     Rate and Rhythm: Normal rate and regular rhythm.     Heart sounds: Normal heart sounds.  Pulmonary:     Effort: Pulmonary effort is normal.     Breath sounds: Normal breath sounds.  Abdominal:     Palpations: Abdomen is soft.     Tenderness: There is no abdominal tenderness.  Musculoskeletal:     Left hip: She exhibits decreased range of motion, decreased strength and tenderness.     Left knee: Normal. No tenderness found. No medial joint line and no lateral joint line tenderness noted.     Left ankle: Normal. No tenderness.     Left lower leg: She exhibits tenderness. She exhibits no bony tenderness and no swelling. No edema.       Feet:  Skin:    General: Skin is warm and dry.  Neurological:     Mental Status: She is alert.      ED Treatments / Results  Labs (all labs ordered are listed, but only abnormal results are displayed) Labs Reviewed  CBC WITH DIFFERENTIAL/PLATELET - Abnormal; Notable for the following components:      Result Value   Neutro Abs 8.7 (*)    All other components within normal limits  COMPREHENSIVE METABOLIC PANEL - Abnormal; Notable for the following components:   Sodium 130 (*)    Chloride 95 (*)    Glucose, Bld 126 (*)    Creatinine, Ser 1.09 (*)    Total Protein 6.0 (*)    Total Bilirubin 1.9 (*)    GFR calc non Af Amer 46 (*)    GFR calc Af Amer 53 (*)    All other components within normal limits  SARS CORONAVIRUS 2 (HOSPITAL ORDER, PERFORMED IN [facility])  PROTIME-INR  URINALYSIS, ROUTINE W REFLEX MICROSCOPIC     EKG None  Radiology Dg Chest Portable 1 View  Result Date: 07/03/2019 CLINICAL DATA:  Broken left hip, preop EXAM: PORTABLE CHEST 1 VIEW COMPARISON:  October 26, 2018 FINDINGS: There is mild cardiomegaly. The lungs are clear. No pneumothorax or pleural effusion. Healing posterior left third through sixth rib fractures are seen. There is  also healing lateral right seventh and eighth rib fractures seen. Advanced bilateral shoulder arthropathy seen. Again noted is flattening and cortical irregularity of the left humeral head. S-shaped scoliotic curvature seen. IMPRESSION: No acute cardiopulmonary process. Healing bilateral rib fractures Electronically Signed   By: Prudencio Pair M.D.   On: 07/03/2019 23:17   Dg Knee Left Port  Result Date: 07/03/2019 CLINICAL DATA:  Hip fracture, pain EXAM: PORTABLE LEFT KNEE - 1-2 VIEW COMPARISON:  None. FINDINGS: There is diffuse osteopenia. No definite fracture is seen. Linear calcifications seen in mediolateral joint space. No large knee joint effusion. 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 IMPRESSION: No acute osseous abnormality. Electronically Signed   By: Prudencio Pair M.D.   On: 07/03/2019 23:21   Dg Tibia/fibula Left Port  Result Date: 07/03/2019 CLINICAL DATA:  Pain, hip fracture EXAM: PORTABLE LEFT TIBIA AND FIBULA - 2 VIEW COMPARISON:  None. FINDINGS: No definite fracture or dislocation. No significant overlying soft tissue swelling. IMPRESSION: No acute osseous injury. Electronically Signed   By: Prudencio Pair M.D.   On: 07/03/2019 23:22   Dg Hip Port Unilat With Pelvis 1v Left  Result Date: 07/03/2019 CLINICAL DATA:  Hip fracture EXAM: DG HIP (WITH OR WITHOUT PELVIS) 1V PORT LEFT COMPARISON:  None. FINDINGS: There is a comminuted impacted fracture of the left femoral head neck junction which extends through the greater and lesser trochanters. There is rotation at the femoral head on the acetabulum, however it is still well seated within the  acetabulum. There is healing right superior and inferior pubic rami fractures. There is cortical step-off seen at the left superior pubic rami, likely nondisplaced fracture. Degenerative changes in the lower lumbar spine. IMPRESSION: Comminuted impacted left femoral head neck fracture extending through the greater and lesser trochanters. Cortical step-off at the superior left pubic rami, likely nondisplaced fracture Healing right superior and inferior pubic rami fractures. Electronically Signed   By: Prudencio Pair M.D.   On: 07/03/2019 23:20    Procedures Procedures (including critical care time)  Medications Ordered in ED Medications - No data to display   Initial Impression / Assessment and Plan / ED Course  I have reviewed the triage vital signs and the nursing notes.  Pertinent labs & imaging results that were available during my care of the patient were reviewed by me and considered in my medical decision making (see chart for details).        Patient seen and examined. Work-up initiated.  Reviewed records sent from [facility].  Vital signs reviewed and are as follows: BP (!) 117/99 (BP Location: Left Arm)    Pulse 91    Temp 98 F (36.7 C) (Oral)    Resp 18    Ht 5' (1.524 m)    Wt 58.5 kg    SpO2 96%    BMI 25.19 kg/m   I spoke with Dr. Lyla Glassing.  Plans for OR first thing in the morning.  He will see patient then.  Patient updated.  Reviewed lab work.  Will admit to hospitalist service.  Spoke with Dr. Maudie Mercury who will see for admission.   Final Clinical Impressions(s) / ED Diagnoses   Final diagnoses:  Pain  Closed fracture of left hip, initial encounter ([place])  Closed fracture of superior ramus of left pubis, initial encounter ([place])   Admit for surgical repair of hip fracture.  ED Discharge Orders    None       Carlisle Cater, PA-C 07/04/19 0015    Tegeler,  Canary Brim, MD 07/04/19 [phone]

## 2019-07-03 NOTE — Progress Notes (Signed)
Patient is being transferred from [facility] with left IT femur fracture. Request hospitalist consult for admission, preop risk stratification. Needs preop labs, CXR, corona test. Plan for surgery tomorrow am. NPO after MN. Hold chemical DVT ppx. Will see patient in preop holding in the am.

## 2019-07-03 NOTE — ED Triage Notes (Signed)
Patient arrives via ems from [place] due to falling earlier at home (lives in [place]). Patient was found to have a left hip fracture and transferred here. Patient received 975mg  of tylenol as well as 2mg  of morphine prior to arrival.   Currently rates her a pain a 4-5/10. States that she is having more discomfort than pain.  Files from [facility] at bedside.

## 2019-07-04 ENCOUNTER — Inpatient Hospital Stay (HOSPITAL_COMMUNITY): Payer: Medicare Other

## 2019-07-04 ENCOUNTER — Encounter (HOSPITAL_COMMUNITY)
Admission: EM | Disposition: A | Discharge status: Skilled Nursing Facility | Payer: Self-pay | Source: Ambulatory Visit | Attending: Internal Medicine

## 2019-07-04 ENCOUNTER — Encounter (HOSPITAL_COMMUNITY): Payer: Self-pay | Admitting: Internal Medicine

## 2019-07-04 ENCOUNTER — Inpatient Hospital Stay (HOSPITAL_COMMUNITY): Payer: Medicare Other | Admitting: Anesthesiology

## 2019-07-04 DIAGNOSIS — W010XXA Fall on same level from slipping, tripping and stumbling without subsequent striking against object, initial encounter: Secondary | ICD-10-CM | POA: Diagnosis present

## 2019-07-04 DIAGNOSIS — D62 Acute posthemorrhagic anemia: Secondary | ICD-10-CM | POA: Diagnosis not present

## 2019-07-04 DIAGNOSIS — Z79899 Other long term (current) drug therapy: Secondary | ICD-10-CM | POA: Diagnosis not present

## 2019-07-04 DIAGNOSIS — E871 Hypo-osmolality and hyponatremia: Secondary | ICD-10-CM | POA: Diagnosis present

## 2019-07-04 DIAGNOSIS — Z20828 Contact with and (suspected) exposure to other viral communicable diseases: Secondary | ICD-10-CM | POA: Diagnosis present

## 2019-07-04 DIAGNOSIS — K219 Gastro-esophageal reflux disease without esophagitis: Secondary | ICD-10-CM | POA: Diagnosis present

## 2019-07-04 DIAGNOSIS — S32592A Other specified fracture of left pubis, initial encounter for closed fracture: Secondary | ICD-10-CM | POA: Diagnosis present

## 2019-07-04 DIAGNOSIS — M810 Age-related osteoporosis without current pathological fracture: Secondary | ICD-10-CM

## 2019-07-04 DIAGNOSIS — I10 Essential (primary) hypertension: Secondary | ICD-10-CM | POA: Diagnosis present

## 2019-07-04 DIAGNOSIS — Z87891 Personal history of nicotine dependence: Secondary | ICD-10-CM | POA: Diagnosis not present

## 2019-07-04 DIAGNOSIS — S72145A Nondisplaced intertrochanteric fracture of left femur, initial encounter for closed fracture: Secondary | ICD-10-CM

## 2019-07-04 DIAGNOSIS — S72142A Displaced intertrochanteric fracture of left femur, initial encounter for closed fracture: Secondary | ICD-10-CM | POA: Diagnosis present

## 2019-07-04 DIAGNOSIS — S72002A Fracture of unspecified part of neck of left femur, initial encounter for closed fracture: Secondary | ICD-10-CM | POA: Diagnosis not present

## 2019-07-04 DIAGNOSIS — Y92039 Unspecified place in apartment as the place of occurrence of the external cause: Secondary | ICD-10-CM | POA: Diagnosis not present

## 2019-07-04 DIAGNOSIS — M419 Scoliosis, unspecified: Secondary | ICD-10-CM | POA: Diagnosis present

## 2019-07-04 DIAGNOSIS — Z7983 Long term (current) use of bisphosphonates: Secondary | ICD-10-CM | POA: Diagnosis not present

## 2019-07-04 DIAGNOSIS — Y93E8 Activity, other personal hygiene: Secondary | ICD-10-CM | POA: Diagnosis not present

## 2019-07-04 DIAGNOSIS — Z886 Allergy status to analgesic agent status: Secondary | ICD-10-CM | POA: Diagnosis not present

## 2019-07-04 DIAGNOSIS — E861 Hypovolemia: Secondary | ICD-10-CM | POA: Diagnosis present

## 2019-07-04 HISTORY — PX: INTRAMEDULLARY (IM) NAIL INTERTROCHANTERIC: SHX5875

## 2019-07-04 LAB — CBC
HCT: 36.5 % (ref 36.0–46.0)
HCT: 29.7 % — ABNORMAL LOW (ref 36.0–46.0)
Hemoglobin: 12 g/dL (ref 12.0–15.0)
Hemoglobin: 10 g/dL — ABNORMAL LOW (ref 12.0–15.0)
MCH: 31.7 pg (ref 26.0–34.0)
MCH: 32.4 pg (ref 26.0–34.0)
MCHC: 32.9 g/dL (ref 30.0–36.0)
MCHC: 33.7 g/dL (ref 30.0–36.0)
MCV: 96.3 fL (ref 80.0–100.0)
MCV: 96.1 fL (ref 80.0–100.0)
Platelets: 163 10*3/uL (ref 150–400)
Platelets: 139 10*3/uL — ABNORMAL LOW (ref 150–400)
RBC: 3.79 MIL/uL — ABNORMAL LOW (ref 3.87–5.11)
RBC: 3.09 MIL/uL — ABNORMAL LOW (ref 3.87–5.11)
RDW: 14.1 % (ref 11.5–15.5)
RDW: 14.1 % (ref 11.5–15.5)
WBC: 9 10*3/uL (ref 4.0–10.5)
WBC: 10 10*3/uL (ref 4.0–10.5)
nRBC: 0 % (ref 0.0–0.2)
nRBC: 0 % (ref 0.0–0.2)

## 2019-07-04 LAB — SARS CORONAVIRUS 2 BY RT PCR (HOSPITAL ORDER, PERFORMED IN ~~LOC~~ HOSPITAL LAB): SARS Coronavirus 2: NEGATIVE

## 2019-07-04 LAB — COMPREHENSIVE METABOLIC PANEL
ALT: 14 U/L (ref 0–44)
AST: 23 U/L (ref 15–41)
Albumin: 3.4 g/dL — ABNORMAL LOW (ref 3.5–5.0)
Alkaline Phosphatase: 46 U/L (ref 38–126)
Anion gap: 11 (ref 5–15)
BUN: 13 mg/dL (ref 8–23)
CO2: 23 mmol/L (ref 22–32)
Calcium: 9.3 mg/dL (ref 8.9–10.3)
Chloride: 99 mmol/L (ref 98–111)
Creatinine, Ser: 1.14 mg/dL — ABNORMAL HIGH (ref 0.44–1.00)
GFR calc Af Amer: 50 mL/min — ABNORMAL LOW (ref >60)
GFR calc non Af Amer: 44 mL/min — ABNORMAL LOW (ref >60)
Glucose, Bld: 158 mg/dL — ABNORMAL HIGH (ref 70–99)
Potassium: 4.2 mmol/L (ref 3.5–5.1)
Sodium: 133 mmol/L — ABNORMAL LOW (ref 135–145)
Total Bilirubin: 1.2 mg/dL (ref 0.3–1.2)
Total Protein: 5.6 g/dL — ABNORMAL LOW (ref 6.5–8.1)

## 2019-07-04 LAB — SURGICAL PCR SCREEN
MRSA, PCR: NEGATIVE
Staphylococcus aureus: NEGATIVE

## 2019-07-04 LAB — VITAMIN D 25 HYDROXY (VIT D DEFICIENCY, FRACTURES): Vit D, 25-Hydroxy: 44.65 ng/mL (ref 30–100)

## 2019-07-04 LAB — CREATININE, SERUM
Creatinine, Ser: 1.22 mg/dL — ABNORMAL HIGH (ref 0.44–1.00)
GFR calc Af Amer: 46 mL/min — ABNORMAL LOW (ref >60)
GFR calc non Af Amer: 40 mL/min — ABNORMAL LOW (ref >60)

## 2019-07-04 SURGERY — FIXATION, FRACTURE, INTERTROCHANTERIC, WITH INTRAMEDULLARY ROD
Anesthesia: General | Site: Leg Upper | Laterality: Left

## 2019-07-04 MED ORDER — ONDANSETRON HCL 4 MG/2ML IJ SOLN
INTRAMUSCULAR | Status: AC
  Filled 2019-07-04: qty 2

## 2019-07-04 MED ORDER — DEXAMETHASONE SODIUM PHOSPHATE 10 MG/ML IJ SOLN
INTRAMUSCULAR | Status: AC
  Filled 2019-07-04: qty 2

## 2019-07-04 MED ORDER — ACETAMINOPHEN 325 MG PO TABS: 650.0000 mg | ORAL_TABLET | Freq: Four times a day (QID) | ORAL | Status: DC | PRN

## 2019-07-04 MED ORDER — POVIDONE-IODINE 10 % EX SWAB
2.0000 "application " | Freq: Once | CUTANEOUS | Status: AC
  Administered 2019-07-04: 2 via TOPICAL

## 2019-07-04 MED ORDER — PROPOFOL 10 MG/ML IV BOLUS
INTRAVENOUS | Status: DC | PRN
  Administered 2019-07-04: 90 mg via INTRAVENOUS

## 2019-07-04 MED ORDER — FOLIC ACID 400 MCG PO TABS: 400.0000 ug | ORAL_TABLET | Freq: Every day | ORAL | Status: DC

## 2019-07-04 MED ORDER — FAMOTIDINE 20 MG PO TABS
20.0000 mg | ORAL_TABLET | Freq: Every day | ORAL | Status: DC
  Administered 2019-07-04 – 2019-07-08 (×5): 20 mg via ORAL
  Filled 2019-07-04 (×5): qty 1

## 2019-07-04 MED ORDER — SODIUM CHLORIDE 0.9 % IV SOLN
INTRAVENOUS | Status: DC | PRN
  Administered 2019-07-04: 35 ug/min via INTRAVENOUS

## 2019-07-04 MED ORDER — ACETAMINOPHEN 325 MG PO TABS
325.0000 mg | ORAL_TABLET | Freq: Four times a day (QID) | ORAL | Status: DC | PRN
  Filled 2019-07-04: qty 1

## 2019-07-04 MED ORDER — 0.9 % SODIUM CHLORIDE (POUR BTL) OPTIME
TOPICAL | Status: DC | PRN
  Administered 2019-07-04: 08:00:00 1000 mL

## 2019-07-04 MED ORDER — OXYCODONE HCL 5 MG/5ML PO SOLN: 5.0000 mg | Freq: Once | ORAL | Status: DC | PRN

## 2019-07-04 MED ORDER — CHLORHEXIDINE GLUCONATE 4 % EX LIQD
CUTANEOUS | Status: AC
  Filled 2019-07-04: qty 15

## 2019-07-04 MED ORDER — SODIUM CHLORIDE 0.9% FLUSH
3.0000 mL | Freq: Two times a day (BID) | INTRAVENOUS | Status: DC
  Administered 2019-07-05 – 2019-07-08 (×6): 3 mL via INTRAVENOUS

## 2019-07-04 MED ORDER — ONDANSETRON HCL 4 MG/2ML IJ SOLN: 4.0000 mg | Freq: Four times a day (QID) | INTRAMUSCULAR | Status: DC | PRN

## 2019-07-04 MED ORDER — HYDROMORPHONE HCL 1 MG/ML IJ SOLN
0.5000 mg | INTRAMUSCULAR | Status: DC | PRN
  Administered 2019-07-04: 0.5 mg via INTRAVENOUS
  Filled 2019-07-04 (×2): qty 1

## 2019-07-04 MED ORDER — SODIUM CHLORIDE 0.9 % IV SOLN
INTRAVENOUS | Status: DC
  Administered 2019-07-04: 01:00:00 via INTRAVENOUS

## 2019-07-04 MED ORDER — ENSURE PRE-SURGERY PO LIQD
296.0000 mL | Freq: Once | ORAL | Status: AC
  Administered 2019-07-04: 296 mL via ORAL
  Filled 2019-07-04: qty 296

## 2019-07-04 MED ORDER — LIDOCAINE 2% (20 MG/ML) 5 ML SYRINGE
INTRAMUSCULAR | Status: AC
  Filled 2019-07-04: qty 5

## 2019-07-04 MED ORDER — GABAPENTIN 300 MG PO CAPS
300.0000 mg | ORAL_CAPSULE | Freq: Two times a day (BID) | ORAL | Status: DC
  Administered 2019-07-04 – 2019-07-08 (×8): 300 mg via ORAL
  Filled 2019-07-04 (×8): qty 1

## 2019-07-04 MED ORDER — ONDANSETRON HCL 4 MG/2ML IJ SOLN: 4.0000 mg | Freq: Once | INTRAMUSCULAR | Status: DC | PRN

## 2019-07-04 MED ORDER — FENTANYL CITRATE (PF) 100 MCG/2ML IJ SOLN: 25.0000 ug | INTRAMUSCULAR | Status: DC | PRN

## 2019-07-04 MED ORDER — HYDROCODONE-ACETAMINOPHEN 5-325 MG PO TABS
1.0000 | ORAL_TABLET | ORAL | Status: DC | PRN
  Administered 2019-07-06 – 2019-07-07 (×2): 2 via ORAL
  Administered 2019-07-08: 1 via ORAL
  Filled 2019-07-04 (×3): qty 2

## 2019-07-04 MED ORDER — ONDANSETRON HCL 4 MG PO TABS: 4.0000 mg | ORAL_TABLET | Freq: Four times a day (QID) | ORAL | Status: DC | PRN

## 2019-07-04 MED ORDER — SODIUM CHLORIDE 0.9% FLUSH: 3.0000 mL | INTRAVENOUS | Status: DC | PRN

## 2019-07-04 MED ORDER — SODIUM CHLORIDE 0.9 % IV SOLN: 250.0000 mL | INTRAVENOUS | Status: DC | PRN

## 2019-07-04 MED ORDER — DOCUSATE SODIUM 100 MG PO CAPS
100.0000 mg | ORAL_CAPSULE | Freq: Two times a day (BID) | ORAL | Status: DC
  Administered 2019-07-04 – 2019-07-07 (×7): 100 mg via ORAL
  Filled 2019-07-04 (×8): qty 1

## 2019-07-04 MED ORDER — PROPOFOL 10 MG/ML IV BOLUS
INTRAVENOUS | Status: AC
  Filled 2019-07-04: qty 20

## 2019-07-04 MED ORDER — GABAPENTIN 300 MG PO CAPS
300.0000 mg | ORAL_CAPSULE | Freq: Three times a day (TID) | ORAL | Status: DC
  Administered 2019-07-04: 300 mg via ORAL
  Filled 2019-07-04: qty 1

## 2019-07-04 MED ORDER — ONDANSETRON HCL 4 MG/2ML IJ SOLN
INTRAMUSCULAR | Status: DC | PRN
  Administered 2019-07-04: 4 mg via INTRAVENOUS

## 2019-07-04 MED ORDER — HYDROCODONE-ACETAMINOPHEN 7.5-325 MG PO TABS
1.0000 | ORAL_TABLET | ORAL | Status: DC | PRN
  Administered 2019-07-06: 2 via ORAL
  Filled 2019-07-04: qty 1
  Filled 2019-07-04: qty 2

## 2019-07-04 MED ORDER — SUCCINYLCHOLINE CHLORIDE 200 MG/10ML IV SOSY
PREFILLED_SYRINGE | INTRAVENOUS | Status: AC
  Filled 2019-07-04: qty 10

## 2019-07-04 MED ORDER — SUGAMMADEX SODIUM 200 MG/2ML IV SOLN
INTRAVENOUS | Status: DC | PRN
  Administered 2019-07-04: 125 mg via INTRAVENOUS

## 2019-07-04 MED ORDER — EPHEDRINE 5 MG/ML INJ
INTRAVENOUS | Status: AC
  Filled 2019-07-04: qty 10

## 2019-07-04 MED ORDER — FENTANYL CITRATE (PF) 250 MCG/5ML IJ SOLN
INTRAMUSCULAR | Status: DC | PRN
  Administered 2019-07-04: 50 ug via INTRAVENOUS
  Administered 2019-07-04: 25 ug via INTRAVENOUS

## 2019-07-04 MED ORDER — CEFAZOLIN SODIUM-DEXTROSE 2-4 GM/100ML-% IV SOLN
2.0000 g | Freq: Four times a day (QID) | INTRAVENOUS | Status: AC
  Administered 2019-07-04 (×2): 2 g via INTRAVENOUS
  Filled 2019-07-04 (×2): qty 100

## 2019-07-04 MED ORDER — HYDROMORPHONE HCL 1 MG/ML IJ SOLN
0.5000 mg | Freq: Once | INTRAMUSCULAR | Status: AC
  Administered 2019-07-04: 0.5 mg via INTRAVENOUS

## 2019-07-04 MED ORDER — LOSARTAN POTASSIUM 50 MG PO TABS
100.0000 mg | ORAL_TABLET | Freq: Every day | ORAL | Status: DC
  Administered 2019-07-04 – 2019-07-06 (×2): 100 mg via ORAL
  Filled 2019-07-04 (×3): qty 2

## 2019-07-04 MED ORDER — LIDOCAINE 2% (20 MG/ML) 5 ML SYRINGE
INTRAMUSCULAR | Status: DC | PRN
  Administered 2019-07-04: 60 mg via INTRAVENOUS

## 2019-07-04 MED ORDER — DEXAMETHASONE SODIUM PHOSPHATE 10 MG/ML IJ SOLN
INTRAMUSCULAR | Status: DC | PRN
  Administered 2019-07-04: 10 mg via INTRAVENOUS

## 2019-07-04 MED ORDER — FENTANYL CITRATE (PF) 250 MCG/5ML IJ SOLN
INTRAMUSCULAR | Status: AC
  Filled 2019-07-04: qty 5

## 2019-07-04 MED ORDER — SENNA 8.6 MG PO TABS
1.0000 | ORAL_TABLET | Freq: Two times a day (BID) | ORAL | Status: DC
  Administered 2019-07-04 – 2019-07-07 (×7): 8.6 mg via ORAL
  Filled 2019-07-04 (×8): qty 1

## 2019-07-04 MED ORDER — PHENYLEPHRINE 40 MCG/ML (10ML) SYRINGE FOR IV PUSH (FOR BLOOD PRESSURE SUPPORT)
PREFILLED_SYRINGE | INTRAVENOUS | Status: AC
  Filled 2019-07-04: qty 10

## 2019-07-04 MED ORDER — VASOPRESSIN 20 UNIT/ML IV SOLN
INTRAVENOUS | Status: DC | PRN
  Administered 2019-07-04 (×2): 1 [IU] via INTRAVENOUS

## 2019-07-04 MED ORDER — ACETAMINOPHEN 325 MG PO TABS: 325.0000 mg | ORAL_TABLET | Freq: Four times a day (QID) | ORAL | Status: DC | PRN

## 2019-07-04 MED ORDER — PHENOL 1.4 % MT LIQD: 1.0000 | OROMUCOSAL | Status: DC | PRN

## 2019-07-04 MED ORDER — LACTATED RINGERS IV SOLN
INTRAVENOUS | Status: DC
  Administered 2019-07-04: 07:00:00 via INTRAVENOUS

## 2019-07-04 MED ORDER — ENOXAPARIN SODIUM 40 MG/0.4ML ~~LOC~~ SOLN: 40.0000 mg | SUBCUTANEOUS | Status: DC

## 2019-07-04 MED ORDER — ENOXAPARIN SODIUM 30 MG/0.3ML ~~LOC~~ SOLN
30.0000 mg | SUBCUTANEOUS | Status: DC
  Administered 2019-07-05 – 2019-07-06 (×2): 30 mg via SUBCUTANEOUS
  Filled 2019-07-04 (×2): qty 0.3

## 2019-07-04 MED ORDER — ROCURONIUM BROMIDE 50 MG/5ML IV SOSY
PREFILLED_SYRINGE | INTRAVENOUS | Status: DC | PRN
  Administered 2019-07-04: 40 mg via INTRAVENOUS

## 2019-07-04 MED ORDER — MENTHOL 3 MG MT LOZG: 1.0000 | LOZENGE | OROMUCOSAL | Status: DC | PRN

## 2019-07-04 MED ORDER — CHLORHEXIDINE GLUCONATE 4 % EX LIQD
60.0000 mL | Freq: Once | CUTANEOUS | Status: AC
  Administered 2019-07-04: 1 via TOPICAL

## 2019-07-04 MED ORDER — ACETAMINOPHEN 650 MG RE SUPP: 650.0000 mg | Freq: Four times a day (QID) | RECTAL | Status: DC | PRN

## 2019-07-04 MED ORDER — VITAMIN B-12 1000 MCG PO TABS
500.0000 ug | ORAL_TABLET | Freq: Every day | ORAL | Status: DC
  Administered 2019-07-04 – 2019-07-08 (×4): 500 ug via ORAL
  Filled 2019-07-04 (×6): qty 1

## 2019-07-04 MED ORDER — METOCLOPRAMIDE HCL 5 MG PO TABS: 5.0000 mg | ORAL_TABLET | Freq: Three times a day (TID) | ORAL | Status: DC | PRN

## 2019-07-04 MED ORDER — ROCURONIUM BROMIDE 10 MG/ML (PF) SYRINGE
PREFILLED_SYRINGE | INTRAVENOUS | Status: AC
  Filled 2019-07-04: qty 10

## 2019-07-04 MED ORDER — VASOPRESSIN 20 UNIT/ML IV SOLN
INTRAVENOUS | Status: AC
  Filled 2019-07-04: qty 1

## 2019-07-04 MED ORDER — FOLIC ACID 1 MG PO TABS
0.5000 mg | ORAL_TABLET | Freq: Every day | ORAL | Status: DC
  Administered 2019-07-04 – 2019-07-08 (×5): 0.5 mg via ORAL
  Filled 2019-07-04 (×5): qty 1

## 2019-07-04 MED ORDER — ACETAMINOPHEN 325 MG PO TABS
325.0000 mg | ORAL_TABLET | Freq: Four times a day (QID) | ORAL | Status: DC | PRN
  Administered 2019-07-04: 325 mg via ORAL
  Administered 2019-07-05 – 2019-07-08 (×3): 650 mg via ORAL
  Filled 2019-07-04: qty 1
  Filled 2019-07-04 (×4): qty 2

## 2019-07-04 MED ORDER — PHENYLEPHRINE 40 MCG/ML (10ML) SYRINGE FOR IV PUSH (FOR BLOOD PRESSURE SUPPORT)
PREFILLED_SYRINGE | INTRAVENOUS | Status: DC | PRN
  Administered 2019-07-04: 160 ug via INTRAVENOUS
  Administered 2019-07-04 (×3): 80 ug via INTRAVENOUS

## 2019-07-04 MED ORDER — METOCLOPRAMIDE HCL 5 MG/ML IJ SOLN: 5.0000 mg | Freq: Three times a day (TID) | INTRAMUSCULAR | Status: DC | PRN

## 2019-07-04 MED ORDER — CEFAZOLIN SODIUM-DEXTROSE 2-4 GM/100ML-% IV SOLN
2.0000 g | INTRAVENOUS | Status: AC
  Administered 2019-07-04: 2 g via INTRAVENOUS
  Filled 2019-07-04: qty 100

## 2019-07-04 MED ORDER — OXYCODONE HCL 5 MG PO TABS: 5.0000 mg | ORAL_TABLET | Freq: Once | ORAL | Status: DC | PRN

## 2019-07-04 MED ORDER — TRANEXAMIC ACID-NACL 1000-0.7 MG/100ML-% IV SOLN
1000.0000 mg | INTRAVENOUS | Status: DC
  Filled 2019-07-04: qty 100

## 2019-07-04 MED ORDER — ALBUMIN HUMAN 5 % IV SOLN
INTRAVENOUS | Status: DC | PRN
  Administered 2019-07-04: 09:00:00 via INTRAVENOUS

## 2019-07-04 SURGICAL SUPPLY — 54 items
ALCOHOL 70% 16 OZ (MISCELLANEOUS) ×2 IMPLANT
BIT DRILL AO GAMMA 4.2X340 (BIT) ×2 IMPLANT
BNDG COHESIVE 4X5 TAN STRL (GAUZE/BANDAGES/DRESSINGS) IMPLANT
CHLORAPREP W/TINT 26 (MISCELLANEOUS) ×2 IMPLANT
COVER PERINEAL POST (MISCELLANEOUS) ×2 IMPLANT
COVER SURGICAL LIGHT HANDLE (MISCELLANEOUS) ×2 IMPLANT
COVER WAND RF STERILE (DRAPES) ×2 IMPLANT
DERMABOND ADVANCED (GAUZE/BANDAGES/DRESSINGS) ×2
DERMABOND ADVANCED .7 DNX12 (GAUZE/BANDAGES/DRESSINGS) ×2 IMPLANT
DRAPE C-ARM 42X72 X-RAY (DRAPES) ×2 IMPLANT
DRAPE C-ARMOR (DRAPES) ×2 IMPLANT
DRAPE HALF SHEET 40X57 (DRAPES) ×2 IMPLANT
DRAPE IMP U-DRAPE 54X76 (DRAPES) ×4 IMPLANT
DRAPE STERI IOBAN 125X83 (DRAPES) ×2 IMPLANT
DRAPE U-SHAPE 47X51 STRL (DRAPES) ×6 IMPLANT
DRSG AQUACEL AG ADV 3.5X 4 (GAUZE/BANDAGES/DRESSINGS) ×4 IMPLANT
DRSG AQUACEL AG ADV 3.5X 6 (GAUZE/BANDAGES/DRESSINGS) ×2 IMPLANT
DRSG MEPILEX BORDER 4X4 (GAUZE/BANDAGES/DRESSINGS) IMPLANT
DRSG PAD ABDOMINAL 8X10 ST (GAUZE/BANDAGES/DRESSINGS) IMPLANT
ELECT REM PT RETURN 9FT ADLT (ELECTROSURGICAL) ×2
ELECTRODE REM PT RTRN 9FT ADLT (ELECTROSURGICAL) ×1 IMPLANT
FACESHIELD WRAPAROUND (MASK) ×2 IMPLANT
GAUZE SPONGE 4X4 12PLY STRL LF (GAUZE/BANDAGES/DRESSINGS) ×2 IMPLANT
GLOVE BIO SURGEON STRL SZ8.5 (GLOVE) ×4 IMPLANT
GLOVE BIOGEL PI IND STRL 8.5 (GLOVE) ×1 IMPLANT
GLOVE BIOGEL PI INDICATOR 8.5 (GLOVE) ×1
GOWN STRL REUS W/ TWL LRG LVL3 (GOWN DISPOSABLE) ×2 IMPLANT
GOWN STRL REUS W/TWL 2XL LVL3 (GOWN DISPOSABLE) ×2 IMPLANT
GOWN STRL REUS W/TWL LRG LVL3 (GOWN DISPOSABLE) ×2
GUIDEROD T2 3X1000 (ROD) ×2 IMPLANT
K-WIRE  3.2X450M STR (WIRE) ×1
K-WIRE 3.2X450M STR (WIRE) ×1
KIT TURNOVER KIT B (KITS) ×2 IMPLANT
KWIRE 3.2X450M STR (WIRE) ×1 IMPLANT
MANIFOLD NEPTUNE II (INSTRUMENTS) ×2 IMPLANT
MARKER SKIN DUAL TIP RULER LAB (MISCELLANEOUS) ×2 IMPLANT
NAIL KIT TROCH 10X170X125 (Nail) ×2 IMPLANT
NS IRRIG 1000ML POUR BTL (IV SOLUTION) ×2 IMPLANT
PACK GENERAL/GYN (CUSTOM PROCEDURE TRAY) ×2 IMPLANT
PACK UNIVERSAL I (CUSTOM PROCEDURE TRAY) ×2 IMPLANT
PAD ARMBOARD 7.5X6 YLW CONV (MISCELLANEOUS) ×4 IMPLANT
PADDING CAST ABS 4INX4YD NS (CAST SUPPLIES)
PADDING CAST ABS COTTON 4X4 ST (CAST SUPPLIES) IMPLANT
REAMER SHAFT BIXCUT (INSTRUMENTS) ×2 IMPLANT
SCREW LAG GAMMA 3 TI 10.5X100M (Screw) ×2 IMPLANT
SCREW LOCKING T2 F/T  5MMX35MM (Screw) ×1 IMPLANT
SCREW LOCKING T2 F/T 5MMX35MM (Screw) ×1 IMPLANT
SUT MNCRL AB 3-0 PS2 27 (SUTURE) IMPLANT
SUT MON AB 2-0 CT1 27 (SUTURE) IMPLANT
SUT MON AB 2-0 CT1 36 (SUTURE) ×2 IMPLANT
SUT VIC AB 1 CT1 27 (SUTURE) ×1
SUT VIC AB 1 CT1 27XBRD ANBCTR (SUTURE) ×1 IMPLANT
TOWEL GREEN STERILE (TOWEL DISPOSABLE) ×2 IMPLANT
TOWEL GREEN STERILE FF (TOWEL DISPOSABLE) ×2 IMPLANT

## 2019-07-04 NOTE — Discharge Instructions (Signed)
 Dr. Elmarie Devlin Adult Hip & Knee Specialist [facility] [address] [place], [place] [address] [phone]   POSTOPERATIVE DIRECTIONS    Hip Rehabilitation, Guidelines Following Surgery   WEIGHT BEARING Weight bearing as tolerated with assist device (walker, cane, etc) as directed, use it as long as suggested by your surgeon or therapist, typically at least 4-6 weeks.   HOME CARE INSTRUCTIONS  Remove items at home which could result in a fall. This includes throw rugs or furniture in walking pathways.  Continue medications as instructed at time of discharge.  You may have some home medications which will be placed on hold until you complete the course of blood thinner medication.  4 days after discharge, you may start showering. No tub baths or soaking your incisions. Do not put on socks or shoes without following the instructions of your caregivers.   Sit on chairs with arms. Use the chair arms to help push yourself up when arising.  Arrange for the use of a toilet seat elevator so you are not sitting low.   Walk with walker as instructed.  You may resume a sexual relationship in one month or when given the OK by your caregiver.  Use walker as long as suggested by your caregivers.  Avoid periods of inactivity such as sitting longer than an hour when not asleep. This helps prevent blood clots.  You may return to work once you are cleared by your surgeon.  Do not drive a car for 6 weeks or until released by your surgeon.  Do not drive while taking narcotics.  Wear elastic stockings for two weeks following surgery during the day but you may remove then at night.  Make sure you keep all of your appointments after your operation with all of your doctors and caregivers. You should call the office at the above phone number and make an appointment for approximately two weeks after the date of your surgery. Please pick up a stool softener and laxative  for home use as long as you are requiring pain medications.  ICE to the affected hip every three hours for 30 minutes at a time and then as needed for pain and swelling. Continue to use ice on the hip for pain and swelling from surgery. You may notice swelling that will progress down to the foot and ankle.  This is normal after surgery.  Elevate the leg when you are not up walking on it.   It is important for you to complete the blood thinner medication as prescribed by your doctor.  Continue to use the breathing machine which will help keep your temperature down.  It is common for your temperature to cycle up and down following surgery, especially at night when you are not up moving around and exerting yourself.  The breathing machine keeps your lungs expanded and your temperature down.  RANGE OF MOTION AND STRENGTHENING EXERCISES  These exercises are designed to help you keep full movement of your hip joint. Follow your caregiver's or physical therapist's instructions. Perform all exercises about fifteen times, three times per day or as directed. Exercise both hips, even if you have had only one joint replacement. These exercises can be done on a training (exercise) mat, on the floor, on a table or on a bed. Use whatever works the best and is most comfortable for you. Use music or television while you are exercising so that the exercises are a pleasant break in your day. This   will make your life better with the exercises acting as a break in routine you can look forward to.  Lying on your back, slowly slide your foot toward your buttocks, raising your knee up off the floor. Then slowly slide your foot back down until your leg is straight again.  Lying on your back spread your legs as far apart as you can without causing discomfort.  Lying on your side, raise your upper leg and foot straight up from the floor as far as is comfortable. Slowly lower the leg and repeat.  Lying on your back, tighten up the  muscle in the front of your thigh (quadriceps muscles). You can do this by keeping your leg straight and trying to raise your heel off the floor. This helps strengthen the largest muscle supporting your knee.  Lying on your back, tighten up the muscles of your buttocks both with the legs straight and with the knee bent at a comfortable angle while keeping your heel on the floor.   SKILLED REHAB INSTRUCTIONS: If the patient is transferred to a skilled rehab facility following release from the hospital, a list of the current medications will be sent to the facility for the patient to continue.  When discharged from the skilled rehab facility, please have the facility set up the patient's Home Health Physical Therapy prior to being released. Also, the skilled facility will be responsible for providing the patient with their medications at time of release from the facility to include their pain medication and their blood thinner medication. If the patient is still at the rehab facility at time of the two week follow up appointment, the skilled rehab facility will also need to assist the patient in arranging follow up appointment in our office and any transportation needs.  MAKE SURE YOU:  Understand these instructions.  Will watch your condition.  Will get help right away if you are not doing well or get worse.  Pick up stool softner and laxative for home use following surgery while on pain medications. Daily dry dressing changes as needed. In 4 days, you may remove your dressings and begin taking showers - no tub baths or soaking the incisions. Continue to use ice for pain and swelling after surgery. Do not use any lotions or creams on the incision until instructed by your surgeon.   

## 2019-07-04 NOTE — Op Note (Signed)
OPERATIVE REPORT  SURGEON: Rod Can, MD   ASSISTANT: Staff.  PREOPERATIVE DIAGNOSIS: Left intertrochanteric femur fracture.   POSTOPERATIVE DIAGNOSIS: Left intertrochanteric femur fracture.   PROCEDURE: Intramedullary fixation, Left femur.   IMPLANTS: Stryker Gamma3 Hip Fracture Nail, 10 by 170 mm, 125 degrees. 10.5 x 100 mm Hip Fracture Nail Lag Screw. 5 x 35 mm distal interlocking screw 1.  ANESTHESIA:  General  ESTIMATED BLOOD LOSS:-100 mL    ANTIBIOTICS: 2 g Ancef.  DRAINS: None.  COMPLICATIONS: None.   CONDITION: PACU - hemodynamically stable.  BRIEF CLINICAL NOTE: Brenda Reynolds is a 85 y.o. female who presented with an intertrochanteric femur fracture. The patient was admitted to the hospitalist service and underwent perioperative risk stratification and medical optimization. The risks, benefits, and alternatives to the procedure were explained, and the patient elected to proceed.  PROCEDURE IN DETAIL: Surgical site was marked by myself. The patient was taken to the operating room and anesthesia was induced on the bed. The patient was then transferred to the [facility] table and the nonoperative lower extremity was scissored underneath the operative side. The fracture was reduced with traction, internal rotation, and adduction. The hip was prepped and draped in the normal sterile surgical fashion. Timeout was called verifying side and site of surgery. Preop antibiotics were given with 60 minutes of beginning the procedure.  Fluoroscopy was used to define the patient's anatomy. A 4 cm incision was made just proximal to the tip of the greater trochanter. The awl was used to obtain the standard starting point for a trochanteric entry nail under fluoroscopic control. The guidepin was placed. The entry reamer was used to open the proximal femur.  On the back table, the nail was assembled onto the jig. The nail was placed into the femur without any difficulty. Through a separate  stab incision, the cannula was placed down to the bone in preparation for the cephalomedullary device. A guidepin was placed into the femoral head using AP and lateral fluoroscopy views. The pin was measured, and then reaming was performed to the appropriate depth. The lag screw was inserted to the appropriate depth. The fracture was compressed through the jig. The setscrew was tightened and then loosened one quarter turn. A separate stab incision was created, and the distal interlocking screw was placed using standard AO technique. The jig was removed. Final AP and lateral fluoroscopy views were obtained to confirm fracture reduction and hardware placement. Tip apex distance was appropriate. There was no chondral penetration.  The wounds were copiously irrigated with saline. The wound was closed in layers with #1 Vicryl for the fascia, 2-0 Monocryl for the deep dermal layer, and 3-0 Monocryl subcuticular stitch. Glue was applied to the skin. Once the glue was fully hardened, sterile dressing was applied. The patient was then awakened from anesthesia and taken to the PACU in stable condition. Sponge needle and instrument counts were correct at the end of the case 2. There were no known complications.  We will readmit the patient to the hospitalist. Weightbearing status will be weightbearing as tolerated with a walker. We will begin Lovenox for DVT prophylaxis. The patient will work with physical therapy and undergo disposition planning.

## 2019-07-04 NOTE — Progress Notes (Signed)
Pulse 119.  MEWS 2.   Notified [facility] per protocol.

## 2019-07-04 NOTE — ED Notes (Signed)
Informed primary RN Tiffany, micro request recollection of COVID swab.

## 2019-07-04 NOTE — Anesthesia Preprocedure Evaluation (Addendum)
Anesthesia Evaluation  Patient identified by MRN, date of birth, ID band Patient awake    Reviewed: Allergy & Precautions, NPO status , Patient's Chart, lab work & pertinent test results  History of Anesthesia Complications Negative for: history of anesthetic complications  Airway Mallampati: II  TM Distance: >3 FB Neck ROM: Full    Dental  (+) Dental Advisory Given, Edentulous Upper, Partial Lower   Pulmonary former smoker,    breath sounds clear to auscultation       Cardiovascular hypertension, Pt. on medications  Rhythm:Regular Rate:Normal     Neuro/Psych negative neurological ROS  negative psych ROS   GI/Hepatic Neg liver ROS, GERD  Medicated and Controlled,  Endo/Other   Hyponatremia   Renal/GU Renal InsufficiencyRenal disease     Musculoskeletal  Scoliosis    Abdominal   Peds  Hematology negative hematology ROS (+)   Anesthesia Other Findings   Reproductive/Obstetrics                            Anesthesia Physical Anesthesia Plan  ASA: II  Anesthesia Plan: General   Post-op Pain Management:    Induction: Intravenous  PONV Risk Score and Plan: 4 or greater and Treatment may vary due to age or medical condition, Ondansetron and Propofol infusion  Airway Management Planned: Oral ETT  Additional Equipment: None  Intra-op Plan:   Post-operative Plan: Extubation in OR  Informed Consent: I have reviewed the patients History and Physical, chart, labs and discussed the procedure including the risks, benefits and alternatives for the proposed anesthesia with the patient or authorized representative who has indicated his/her understanding and acceptance.     Dental advisory given  Plan Discussed with: CRNA and Anesthesiologist  Anesthesia Plan Comments:        Anesthesia Quick Evaluation

## 2019-07-04 NOTE — Transfer of Care (Signed)
Immediate Anesthesia Transfer of Care Note  Patient: Brenda Reynolds  Procedure(s) Performed: INTRAMEDULLARY (IM) NAIL INTERTROCHANTRIC (Left Leg Upper)  Patient Location: PACU  Anesthesia Type:General  Level of Consciousness: awake and alert   Airway & Oxygen Therapy: Patient Spontanous Breathing and Patient connected to nasal cannula oxygen  Post-op Assessment: Report given to RN and Post -op Vital signs reviewed and stable  Post vital signs: Reviewed and stable  Last Vitals:  Vitals Value Taken Time  BP 109/62 07/04/19 1006  Temp    Pulse 98 07/04/19 1006  Resp 24 07/04/19 1006  SpO2 98 % 07/04/19 1006  Vitals shown include unvalidated device data.  Last Pain:  Vitals:   07/04/19 0708  TempSrc:   PainSc: 0-No pain      Patients Stated Pain Goal: 4 (50/53/97 6734)  Complications: No apparent anesthesia complications

## 2019-07-04 NOTE — Progress Notes (Addendum)
PROGRESS NOTE    Brenda Reynolds  NAT:[id number] DOB: [birth year] DOA: 07/03/2019 PCP: Toma Deiters, MD    Brief Narrative:  85 year old female who presented with left hip pain after mechanical fall.  She does have significant past medical history for hypertension, GERD and osteoporosis.  She suffered a mechanical fall while changing clothing, no loss of consciousness or head trauma.  On her initial physical examination pulse rate 89, respiratory rate 16, blood pressure 125/78, oxygen saturation 98% on room air.  She was awake and alert, lungs clear to auscultation bilaterally, heart S1-S2 present rhythm, abdomen soft, no lower extremity edema. Left hip films with a comminuted impacted left femoral head neck fracture extending through the greater and lesser trochanters.  Cortical step-off of the superior left pubic rami, likley nondisplaced fracture. SARS COVID 19 negative.   Patient was admitted to the hospital with working diagnosis of left femoral head neck fracture with left superior pubic rami fracture   Assessment & Plan:   Principal Problem:   Intertrochanteric fracture of left femur (HCC) Active Problems:   Hyponatremia   Hypertension   Osteoporosis   GERD (gastroesophageal reflux disease)   1. Acute left femoral neck fracture sp intramedullary fixation. Post op with no significant pain, no nausea and no dyspnea. Will continue pain control, physical therapy and DVT prophylaxis. Follow with orthopedic surgery recommendations. Transfer to medical ward, discontinue telemetry.  2. HTN. Continue blood pressure control with losartan.   3. GERD. Continue proton pump inhibition, advance diet post op as tolerated.   4. Osteoporosis. Patient on fosamax at home.   5. Hyponatremia. Na this am 133, with preserved renal function serum cr 1,14 with K at 4,2 and serum bicarbonate at 23. Will follow with renal panel in am. Hold on IV fluids for now.    DVT prophylaxis: enoxaparin   Code  Status: full Family Communication: no family at the bedside  Disposition Plan/ discharge barriers: pending physical therapy evaluation.   Body mass index is 24.96 kg/m. Malnutrition Type:      Malnutrition Characteristics:      Nutrition Interventions:     RN Pressure Injury Documentation:     Consultants:   Orthopedics  Procedures:   Intramedullary fixation left femur   Antimicrobials:       Subjective: Patient is feeling better, left hip pain is controlled, no nausea or vomiting, no dyspnea or chest pain. Lives at home alone.   Objective: Vitals:   07/04/19 0304 07/04/19 0626 07/04/19 1005 07/04/19 1020  BP: 117/77 (!) 103/59 109/62 108/67  Pulse: 86 (!) 101 (!) 112 98  Resp: 18 18 (!) 27 17  Temp: (!) 97.5 F (36.4 C) (!) 97.3 F (36.3 C) (!) 97.5 F (36.4 C)   TempSrc: Oral Oral    SpO2: 100% 100% 97% 94%  Weight: 58 kg     Height: 5' (1.524 m)       Intake/Output Summary (Last 24 hours) at 07/04/2019 1159 Last data filed at 07/04/2019 1000 Gross per 24 hour  Intake 1050 ml  Output 450 ml  Net 600 ml   Filed Weights   07/03/19 2155 07/04/19 0304  Weight: 58.5 kg 58 kg    Examination:   General: Not in pain or dyspnea, deconditioned  Neurology: Awake and alert, non focal  E ENT:  mild pallor, no icterus, oral mucosa moist Cardiovascular: No JVD. S1-S2 present, rhythmic, no gallops, rubs, or murmurs. No lower extremity edema. Pulmonary: positive breath sounds bilaterally, adequate  air movement, no wheezing, rhonchi or rales. Gastrointestinal. Abdomen with no organomegaly, non tender, no rebound or guarding Skin. No rashes Musculoskeletal: no joint deformities     Data Reviewed: I have personally reviewed following labs and imaging studies  CBC: Recent Labs  Lab 07/03/19 2304 07/04/19 0525  WBC 10.3 9.0  NEUTROABS 8.7*  --   HGB 12.8 12.0  HCT 37.8 36.5  MCV 95.9 96.3  PLT 177 622   Basic Metabolic Panel: Recent Labs   Lab 07/03/19 2304 07/04/19 0525  NA 130* 133*  K 4.6 4.2  CL 95* 99  CO2 24 23  GLUCOSE 126* 158*  BUN 11 13  CREATININE 1.09* 1.14*  CALCIUM 9.3 9.3   GFR: Estimated Creatinine Clearance: 28.2 mL/min (A) (by C-G formula based on SCr of 1.14 mg/dL (H)). Liver Function Tests: Recent Labs  Lab 07/03/19 2304 07/04/19 0525  AST 26 23  ALT 12 14  ALKPHOS 45 46  BILITOT 1.9* 1.2  PROT 6.0* 5.6*  ALBUMIN 3.6 3.4*   No results for input(s): LIPASE, AMYLASE in the last 168 hours. No results for input(s): AMMONIA in the last 168 hours. Coagulation Profile: Recent Labs  Lab 07/03/19 2304  INR 1.0   Cardiac Enzymes: No results for input(s): CKTOTAL, CKMB, CKMBINDEX, TROPONINI in the last 168 hours. BNP (last 3 results) No results for input(s): PROBNP in the last 8760 hours. HbA1C: No results for input(s): HGBA1C in the last 72 hours. CBG: No results for input(s): GLUCAP in the last 168 hours. Lipid Profile: No results for input(s): CHOL, HDL, LDLCALC, TRIG, CHOLHDL, LDLDIRECT in the last 72 hours. Thyroid Function Tests: No results for input(s): TSH, T4TOTAL, FREET4, T3FREE, THYROIDAB in the last 72 hours. Anemia Panel: No results for input(s): VITAMINB12, FOLATE, FERRITIN, TIBC, IRON, RETICCTPCT in the last 72 hours.    Radiology Studies: I have reviewed all of the imaging during this hospital visit personally     Scheduled Meds: . famotidine  20 mg Oral Daily  . folic acid  0.5 mg Oral Daily  . gabapentin  300 mg Oral TID  . losartan  100 mg Oral Daily  . vitamin B-12  500 mcg Oral Daily   Continuous Infusions: . sodium chloride       LOS: 0 days        Shlomie Romig Gerome Apley, MD

## 2019-07-04 NOTE — Consult Note (Signed)
ORTHOPAEDIC CONSULTATION  REQUESTING PHYSICIAN: Arrien, York RamMauricio Daniel,*  PCP:  Toma DeitersHasanaj, Xaje A, MD  Chief Complaint: Left hip injury  HPI: Brenda Reynolds is a 85 y.o. female with a history of hypertension, GERD, and osteoporosis who lives alone.  Ambulates with a walker occasionally, mostly independently.  She tripped and fell at home yesterday.  She had left hip pain and inability to weight-bear.  [facility] was [facility], where x-rays revealed a comminuted, displaced intertrochanteric left femur fracture.  [facility].  She was admitted by the hospitalist for perioperative or stratification and medical optimization.  Orthopedic consultation was placed for management of the left hip fracture.  She denies other injuries.  Past Medical History:  Diagnosis Date  . GERD (gastroesophageal reflux disease)   . Hypertension   . Intertrochanteric fracture of left femur (HCC)   . Osteoporosis   . Scoliosis    Past Surgical History:  Procedure Laterality Date  . CATARACT EXTRACTION    . DILATION AND CURETTAGE OF UTERUS    . EYE SURGERY     Bilateral cataracts  . TONSILLECTOMY     Social History   Socioeconomic History  . Marital status: Widowed    Spouse name: Not on file  . Number of children: Not on file  . Years of education: Not on file  . Highest education level: Not on file  Occupational History  . Not on file  Social Needs  . Financial resource strain: Not on file  . Food insecurity    Worry: Not on file    Inability: Not on file  . Transportation needs    Medical: Not on file    Non-medical: Not on file  Tobacco Use  . Smoking status: Former Games developermoker  . Smokeless tobacco: Never Used  Substance and Sexual Activity  . Alcohol use: Yes    Alcohol/week: 1.0 standard drinks    Types: 1 Glasses of wine per week  . Drug use: Yes    Types: Cocaine  . Sexual activity:  Not on file  Lifestyle  . Physical activity    Days per week: Not on file    Minutes per session: Not on file  . Stress: Not on file  Relationships  . Social Musicianconnections    Talks on phone: Not on file    Gets together: Not on file    Attends religious service: Not on file    Active member of club or organization: Not on file    Attends meetings of clubs or organizations: Not on file    Relationship status: Not on file  Other Topics Concern  . Not on file  Social History Narrative  . Not on file   Family History  Problem Relation Age of Onset  . Cancer Mother    Allergies  Allergen Reactions  . Lidocaine Rash    Patch   Prior to Admission medications   Medication Sig Start Date End Date Taking? Authorizing Provider  acetaminophen (TYLENOL) 325 MG tablet Take 650 mg by mouth 3 (three) times daily.   Yes [provider]  alendronate (FOSAMAX) 70 MG tablet Take 70 mg by mouth every Tuesday. 04/30/19  Yes [provider]  calcium-vitamin D (OSCAL WITH D) 500-200 MG-UNIT tablet Take 1 tablet by mouth 3 (three) times daily.   Yes [provider]  famotidine (PEPCID) 40 MG tablet Take 20 mg by  mouth daily. 02/08/19  Yes [provider]  folic acid (FOLVITE) 400 MCG tablet Take 400 mcg by mouth daily.   Yes [provider]  gabapentin (NEURONTIN) 300 MG capsule Take 300 mg by mouth 3 (three) times daily. 04/21/19  Yes [provider]  glucosamine-chondroitin 500-400 MG tablet Take 1 tablet by mouth daily.   Yes [provider]  losartan (COZAAR) 100 MG tablet Take 100 mg by mouth daily. 05/05/19  Yes [provider]  Omega-3 Fatty Acids (FISH OIL) 1000 MG CAPS Take 2,000 mg by mouth daily.   Yes [provider]  vitamin B-12 (CYANOCOBALAMIN) 500 MCG tablet Take 500 mcg by mouth daily.   Yes [provider]  VITAMIN E PO Take 1 capsule by mouth daily.   Yes [provider]  VITAMIN K PO Take 1  tablet by mouth daily.   Yes [provider]   Dg Chest Portable 1 View  Result Date: 07/03/2019 CLINICAL DATA:  Broken left hip, preop EXAM: PORTABLE CHEST 1 VIEW COMPARISON:  October 26, 2018 FINDINGS: There is mild cardiomegaly. The lungs are clear. No pneumothorax or pleural effusion. Healing posterior left third through sixth rib fractures are seen. There is also healing lateral right seventh and eighth rib fractures seen. Advanced bilateral shoulder arthropathy seen. Again noted is flattening and cortical irregularity of the left humeral head. S-shaped scoliotic curvature seen. IMPRESSION: No acute cardiopulmonary process. Healing bilateral rib fractures Electronically Signed   By: Jonna Clark M.D.   On: 07/03/2019 23:17   Dg Knee Left Port  Result Date: 07/03/2019 CLINICAL DATA:  Hip fracture, pain EXAM: PORTABLE LEFT KNEE - 1-2 VIEW COMPARISON:  None. FINDINGS: There is diffuse osteopenia. No definite fracture is seen. Linear calcifications seen in mediolateral joint space. No large knee joint effusion. 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 IMPRESSION: No acute osseous abnormality. Electronically Signed   By: Jonna Clark M.D.   On: 07/03/2019 23:21   Dg Tibia/fibula Left Port  Result Date: 07/03/2019 CLINICAL DATA:  Pain, hip fracture EXAM: PORTABLE LEFT TIBIA AND FIBULA - 2 VIEW COMPARISON:  None. FINDINGS: No definite fracture or dislocation. No significant overlying soft tissue swelling. IMPRESSION: No acute osseous injury. Electronically Signed   By: Jonna Clark M.D.   On: 07/03/2019 23:22   Dg Hip Port Unilat With Pelvis 1v Left  Result Date: 07/03/2019 CLINICAL DATA:  Hip fracture EXAM: DG HIP (WITH OR WITHOUT PELVIS) 1V PORT LEFT COMPARISON:  None. FINDINGS: There is a comminuted impacted fracture of the left femoral head neck junction which extends through the greater and lesser trochanters. There is rotation at the femoral head on the acetabulum, however it is still  well seated within the acetabulum. There is healing right superior and inferior pubic rami fractures. There is cortical step-off seen at the left superior pubic rami, likely nondisplaced fracture. Degenerative changes in the lower lumbar spine. IMPRESSION: Comminuted impacted left femoral head neck fracture extending through the greater and lesser trochanters. Cortical step-off at the superior left pubic rami, likely nondisplaced fracture Healing right superior and inferior pubic rami fractures. Electronically Signed   By: Jonna Clark M.D.   On: 07/03/2019 23:20    Positive ROS: All other systems have been reviewed and were otherwise negative with the exception of those mentioned in the HPI and as above.  Physical Exam: General: Alert, no acute distress Cardiovascular: No pedal edema Respiratory: No cyanosis, no use of accessory musculature GI: No organomegaly, abdomen is soft  and non-tender Skin: No lesions in the area of chief complaint Neurologic: Sensation intact distally Psychiatric: Patient is competent for consent with normal mood and affect Lymphatic: No axillary or cervical lymphadenopathy  MUSCULOSKELETAL: Examination of the left hip reveals no skin wounds or lesions.  She is shortened and rotated.  She has pain with logrolling of the hip.  She is neurovascularly intact distally.  Assessment: Closed, comminuted left intertrochanteric femur fracture  Plan: I discussed the findings with the patient.  She has a displaced left intertrochanteric femur fracture that will require surgical stabilization.  We discussed the risk, benefits, and alternatives to intramedullary fixation.  Please see statement of risk.  Plan for surgery today.  Continue n.p.o.  All questions solicited and answered.  The risks, benefits, and alternatives were discussed with the patient. There are risks associated with the surgery including, but not limited to, problems with anesthesia (death), infection,  differences in leg length/angulation/rotation, fracture of bones, loosening or failure of implants, malunion, nonunion, hematoma (blood accumulation) which may require surgical drainage, blood clots, pulmonary embolism, nerve injury (foot drop), and blood vessel injury. The patient understands these risks and elects to proceed.     Bertram Savin, MD Cell [phone]    07/04/2019 8:00 AM

## 2019-07-04 NOTE — Anesthesia Procedure Notes (Signed)
Procedure Name: Intubation Date/Time: 07/04/2019 8:46 AM Performed by: Shirlyn Goltz, CRNA Pre-anesthesia Checklist: Patient identified, Suction available, Emergency Drugs available and Patient being monitored Patient Re-evaluated:Patient Re-evaluated prior to induction Oxygen Delivery Method: Circle system utilized Preoxygenation: Pre-oxygenation with 100% oxygen Induction Type: IV induction Ventilation: Mask ventilation without difficulty Laryngoscope Size: Mac and 3 Grade View: Grade I Tube type: Oral Tube size: 7.0 mm Number of attempts: 1 Airway Equipment and Method: Stylet Placement Confirmation: ETT inserted through vocal cords under direct vision,  positive ETCO2 and breath sounds checked- equal and bilateral Secured at: 20 cm Tube secured with: Tape Dental Injury: Teeth and Oropharynx as per pre-operative assessment

## 2019-07-04 NOTE — Anesthesia Postprocedure Evaluation (Signed)
Anesthesia Post Note  Patient: Felipa B Marszalek  Procedure(s) Performed: INTRAMEDULLARY (IM) NAIL INTERTROCHANTRIC (Left Leg Upper)     Patient location during evaluation: PACU Anesthesia Type: General Level of consciousness: awake and alert Pain management: pain level controlled Vital Signs Assessment: post-procedure vital signs reviewed and stable Respiratory status: spontaneous breathing, nonlabored ventilation, respiratory function stable and patient connected to nasal cannula oxygen Cardiovascular status: blood pressure returned to baseline and stable Postop Assessment: no apparent nausea or vomiting Anesthetic complications: no    Last Vitals:  Vitals:   07/04/19 1020 07/04/19 1206  BP: 108/67 121/69  Pulse: 98 93  Resp: 17 20  Temp:  (!) 36.3 C  SpO2: 94% 97%    Last Pain:  Vitals:   07/04/19 1206  TempSrc: Oral  PainSc:                  Audry Pili

## 2019-07-04 NOTE — H&P (Addendum)
TRH H&P    Patient Demographics:    Brenda Reynolds, is a 85 y.o. female  MRN: [id number]  DOB - 1930-03-23  Admit Date - 07/03/2019  Referring MD/NP/PA: Renne Crigler  Outpatient Primary MD for the patient is Toma Deiters, MD  Patient coming from:  home  Chief complaint- fall, left hip pain   HPI:    Brenda Reynolds  is a 85 y.o. female, w hypertension, gerd, osteoporosis, presents with mechanical fall at home while going to get some clothes to change into.  Pt denies syncope, cp, palp, sob, n/v, abd pain, diarrhea, brbpr, black stool. Pt was brought to [facility] due to left hip pain.   In ED,  P 89 R 16, Bp 125/78 pox 98% on RA INR  1.1, Wbc 15.9, hgb 12.5, Plt 173 Na 134, K 4.2,  Bun 13, Creatinine 1.02  L hip xray-> comminuted left intertrochanteric fracture with varus angulation.   Apparently ED spoke with Dr. Linna Caprice and requested transfer to [facility] for evaluation of L intertrochanteric fracture.   ED to ED transfer performed.   CXR IMPRESSION: No acute cardiopulmonary process. Healing bilateral rib fractures  Xray hip IMPRESSION: Comminuted impacted left femoral head neck fracture extending through the greater and lesser trochanters.  Cortical step-off at the superior left pubic rami, likely nondisplaced fracture  Healing right superior and inferior pubic rami fractures.  L knee Impression  No acute osseous abnormality  L tib/ fib IMPRESSION: No acute osseous injury.  Na 130, K 4.6,  Bun 11, Creatinine 1.09 INR 1.0 Wbc 10.3, hgb 12.8, Plt 177   Pt will be admitted for comminuted left intertrochanteric fracture.     Review of systems:    In addition to the HPI above,  No Fever-chills, No Headache, No changes with Vision or hearing, No problems swallowing food or Liquids, No Chest pain, Cough or Shortness of Breath, No Abdominal pain, No Nausea or Vomiting,  bowel movements are regular, No Blood in stool or Urine, No dysuria, No new skin rashes or bruises,  No new weakness, tingling, numbness in any extremity, No recent weight gain or loss, No polyuria, polydypsia or polyphagia, No significant Mental Stressors.  All other systems reviewed and are negative.    Past History of the following :    Past Medical History:  Diagnosis Date   GERD (gastroesophageal reflux disease)    Hypertension    Osteoporosis    Scoliosis       Past Surgical History:  Procedure Laterality Date   CATARACT EXTRACTION     TONSILLECTOMY        Social History:      Social History   Tobacco Use   Smoking status: Former Smoker   Smokeless tobacco: Never Used  Substance Use Topics   Alcohol use: Yes    Alcohol/week: 1.0 standard drinks    Types: 1 Glasses of wine per week       Family History :     Family History  Problem Relation Age of Onset  Cancer Mother        Home Medications:   Prior to Admission medications   Medication Sig Start Date End Date Taking? Authorizing Provider  acetaminophen (TYLENOL) 325 MG tablet Take 650 mg by mouth 3 (three) times daily.   Yes [provider]  alendronate (FOSAMAX) 70 MG tablet Take 70 mg by mouth every Tuesday. 04/30/19  Yes [provider]  calcium-vitamin D (OSCAL WITH D) 500-200 MG-UNIT tablet Take 1 tablet by mouth 3 (three) times daily.   Yes [provider]  famotidine (PEPCID) 40 MG tablet Take 20 mg by mouth daily. 02/08/19  Yes [provider]  folic acid (FOLVITE) 400 MCG tablet Take 400 mcg by mouth daily.   Yes [provider]  gabapentin (NEURONTIN) 300 MG capsule Take 300 mg by mouth 3 (three) times daily. 04/21/19  Yes [provider]  glucosamine-chondroitin 500-400 MG tablet Take 1 tablet by mouth daily.   Yes [provider]  losartan (COZAAR) 100 MG tablet Take 100 mg by mouth daily. 05/05/19  Yes [provider]  Omega-3 Fatty Acids (FISH OIL) 1000 MG CAPS Take 2,000 mg by mouth daily.   Yes [provider]  vitamin B-12 (CYANOCOBALAMIN) 500 MCG tablet Take 500 mcg by mouth daily.   Yes [provider]  VITAMIN E PO Take 1 capsule by mouth daily.   Yes [provider]  VITAMIN K PO Take 1 tablet by mouth daily.   Yes [provider]     Allergies:     Allergies  Allergen Reactions   Lidocaine Rash     Physical Exam:   Vitals  Blood pressure (!) 117/99, pulse 91, temperature 98 F (36.7 C), temperature source Oral, resp. rate 18, height 5' (1.524 m), weight 58.5 kg, SpO2 96 %.  1.  General: axoxo3  2. Psychiatric: euthymic  3. Neurologic: nonfocal  4. HEENMT:  Anicteric, pupils 1.91mm symmetric, direct, consensual intact Neck: no jvd  5. Respiratory : CTAB  6. Cardiovascular : rrr s1, s2, 1/6 sem rusb  7. Gastrointestinal:  Abd: soft, nt, nd, +bs  8. Skin:  Ext: no c/c/e,  No rash  9.Musculoskeletal:  Good ROM    Data Review:    CBC Recent Labs  Lab 07/03/19 2304  WBC 10.3  HGB 12.8  HCT 37.8  PLT 177  MCV 95.9  MCH 32.5  MCHC 33.9  RDW 14.2  LYMPHSABS 0.7  MONOABS 0.9  EOSABS 0.0  BASOSABS 0.0   ------------------------------------------------------------------------------------------------------------------  Results for orders placed or performed during the hospital encounter of 07/03/19 (from the past 48 hour(s))  CBC WITH DIFFERENTIAL     Status: Abnormal   Collection Time: 07/03/19 11:04 PM  Result Value Ref Range   WBC 10.3 4.0 - 10.5 K/uL   RBC 3.94 3.87 - 5.11 MIL/uL   Hemoglobin 12.8 12.0 - 15.0 g/dL   HCT 65.7 84.6 - 96.2 %   MCV 95.9 80.0 - 100.0 fL   MCH 32.5 26.0 - 34.0 pg   MCHC 33.9 30.0 - 36.0 g/dL   RDW 95.2 84.1 - 32.4 %   Platelets 177 150 - 400 K/uL   nRBC 0.0 0.0 - 0.2 %   Neutrophils Relative % 86 %   Neutro Abs 8.7 (H) 1.7 - 7.7 K/uL   Lymphocytes Relative 6 %    Lymphs Abs 0.7 0.7 - 4.0 K/uL   Monocytes Relative 8 %   Monocytes Absolute 0.9 0.1 - 1.0 K/uL  Eosinophils Relative 0 %   Eosinophils Absolute 0.0 0.0 - 0.5 K/uL   Basophils Relative 0 %   Basophils Absolute 0.0 0.0 - 0.1 K/uL   Immature Granulocytes 0 %   Abs Immature Granulocytes 0.04 0.00 - 0.07 K/uL    Comment: Performed at [facility], [address] [place], [place] [address]  Comprehensive metabolic panel     Status: Abnormal   Collection Time: 07/03/19 11:04 PM  Result Value Ref Range   Sodium 130 (L) 135 - 145 mmol/L   Potassium 4.6 3.5 - 5.1 mmol/L   Chloride 95 (L) 98 - 111 mmol/L   CO2 24 22 - 32 mmol/L   Glucose, Bld 126 (H) 70 - 99 mg/dL   BUN 11 8 - 23 mg/dL   Creatinine, Ser 6.041.09 (H) 0.44 - 1.00 mg/dL   Calcium 9.3 8.9 - 54.010.3 mg/dL   Total Protein 6.0 (L) 6.5 - 8.1 g/dL   Albumin 3.6 3.5 - 5.0 g/dL   AST 26 15 - 41 U/L   ALT 12 0 - 44 U/L   Alkaline Phosphatase 45 38 - 126 U/L   Total Bilirubin 1.9 (H) 0.3 - 1.2 mg/dL   GFR calc non Af Amer 46 (L) >60 mL/min   GFR calc Af Amer 53 (L) >60 mL/min   Anion gap 11 5 - 15    Comment: Performed at [facility], [address] [place], [place] [address]  Protime-INR     Status: None   Collection Time: 07/03/19 11:04 PM  Result Value Ref Range   Prothrombin Time 12.6 11.4 - 15.2 seconds   INR 1.0 0.8 - 1.2    Comment: (NOTE) INR goal varies based on device and disease states. Performed at [facility], [address] [place], [place] [address]     Chemistries  Recent Labs  Lab 07/03/19 2304  NA 130*  K 4.6  CL 95*  CO2 24  GLUCOSE 126*  BUN 11  CREATININE 1.09*  CALCIUM 9.3  AST 26  ALT 12  ALKPHOS 45  BILITOT 1.9*   ------------------------------------------------------------------------------------------------------------------  ------------------------------------------------------------------------------------------------------------------ GFR: Estimated Creatinine  Clearance: 29.7 mL/min (A) (by C-G formula based on SCr of 1.09 mg/dL (H)). Liver Function Tests: Recent Labs  Lab 07/03/19 2304  AST 26  ALT 12  ALKPHOS 45  BILITOT 1.9*  PROT 6.0*  ALBUMIN 3.6   No results for input(s): LIPASE, AMYLASE in the last 168 hours. No results for input(s): AMMONIA in the last 168 hours. Coagulation Profile: Recent Labs  Lab 07/03/19 2304  INR 1.0   Cardiac Enzymes: No results for input(s): CKTOTAL, CKMB, CKMBINDEX, TROPONINI in the last 168 hours. BNP (last 3 results) No results for input(s): PROBNP in the last 8760 hours. HbA1C: No results for input(s): HGBA1C in the last 72 hours. CBG: No results for input(s): GLUCAP in the last 168 hours. Lipid Profile: No results for input(s): CHOL, HDL, LDLCALC, TRIG, CHOLHDL, LDLDIRECT in the last 72 hours. Thyroid Function Tests: No results for input(s): TSH, T4TOTAL, FREET4, T3FREE, THYROIDAB in the last 72 hours. Anemia Panel: No results for input(s): VITAMINB12, FOLATE, FERRITIN, TIBC, IRON, RETICCTPCT in the last 72 hours.  --------------------------------------------------------------------------------------------------------------- Urine analysis: No results found for: COLORURINE, APPEARANCEUR, LABSPEC, PHURINE, GLUCOSEU, HGBUR, BILIRUBINUR, KETONESUR, PROTEINUR, UROBILINOGEN, NITRITE, LEUKOCYTESUR    Imaging Results:    Dg Chest Portable 1 View  Result Date: 07/03/2019 CLINICAL DATA:  Broken left hip, preop EXAM: PORTABLE CHEST 1 VIEW  COMPARISON:  October 26, 2018 FINDINGS: There is mild cardiomegaly. The lungs are clear. No pneumothorax or pleural effusion. Healing posterior left third through sixth rib fractures are seen. There is also healing lateral right seventh and eighth rib fractures seen. Advanced bilateral shoulder arthropathy seen. Again noted is flattening and cortical irregularity of the left humeral head. S-shaped scoliotic curvature seen. IMPRESSION: No acute cardiopulmonary  process. Healing bilateral rib fractures Electronically Signed   By: Prudencio Pair M.D.   On: 07/03/2019 23:17   Dg Knee Left Port  Result Date: 07/03/2019 CLINICAL DATA:  Hip fracture, pain EXAM: PORTABLE LEFT KNEE - 1-2 VIEW COMPARISON:  None. FINDINGS: There is diffuse osteopenia. No definite fracture is seen. Linear calcifications seen in mediolateral joint space. No large knee joint effusion. 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 IMPRESSION: No acute osseous abnormality. Electronically Signed   By: Prudencio Pair M.D.   On: 07/03/2019 23:21   Dg Tibia/fibula Left Port  Result Date: 07/03/2019 CLINICAL DATA:  Pain, hip fracture EXAM: PORTABLE LEFT TIBIA AND FIBULA - 2 VIEW COMPARISON:  None. FINDINGS: No definite fracture or dislocation. No significant overlying soft tissue swelling. IMPRESSION: No acute osseous injury. Electronically Signed   By: Prudencio Pair M.D.   On: 07/03/2019 23:22   Dg Hip Port Unilat With Pelvis 1v Left  Result Date: 07/03/2019 CLINICAL DATA:  Hip fracture EXAM: DG HIP (WITH OR WITHOUT PELVIS) 1V PORT LEFT COMPARISON:  None. FINDINGS: There is a comminuted impacted fracture of the left femoral head neck junction which extends through the greater and lesser trochanters. There is rotation at the femoral head on the acetabulum, however it is still well seated within the acetabulum. There is healing right superior and inferior pubic rami fractures. There is cortical step-off seen at the left superior pubic rami, likely nondisplaced fracture. Degenerative changes in the lower lumbar spine. IMPRESSION: Comminuted impacted left femoral head neck fracture extending through the greater and lesser trochanters. Cortical step-off at the superior left pubic rami, likely nondisplaced fracture Healing right superior and inferior pubic rami fractures. Electronically Signed   By: Prudencio Pair M.D.   On: 07/03/2019 23:20   ekg pending   Assessment & Plan:    Principal Problem:    Intertrochanteric fracture of left femur (HCC) Active Problems:   Hyponatremia   Hypertension   Osteoporosis   GERD (gastroesophageal reflux disease)  L intertrochanteric, fracture NPO after MN Dilaudid 0.5mg  iv q4h prn  Zofran 4mg  iv q6h prn  Orthopedics ([place]) consulted by ED, (per [facility]), will be by in AM , Appreciate input   Hypertension Cont Losartan 100mg  po qday  Gerd Cont Pepcid 20mg  po qday  Osteoporosis Hold Fosamax,  Pt will need different antiresorptive therapy Check vitamin D  DVT Prophylaxis-    - SCDs for now since to OR in am,  Defer to orthopedics regarding choice of DVT prophylaxis  AM Labs Ordered, also please review Full Orders  Family Communication: Admission, patients condition and plan of care including tests being ordered have been discussed with the patient  who indicate understanding and agree with the plan and Code Status.  Code Status:  FULL CODE per patient, pt states that she doesn't have any direct family, attempted to notify  Rudi Rummage regarding admission to [facility] , no one picked up  Admission status: /Inpatient: Based on patients clinical presentation and evaluation of above clinical data, I have made determination that patient meets Inpatient criteria at this time. Pt has L intertrochanteric hip  fracture,  Pt will require surgery, pt needs >2 nite stay.   Time spent in minutes : 55 minutes   Pearson Grippe M.D on 07/04/2019 at 12:29 AM

## 2019-07-05 ENCOUNTER — Encounter (HOSPITAL_COMMUNITY): Payer: Self-pay | Admitting: Orthopedic Surgery

## 2019-07-05 DIAGNOSIS — E871 Hypo-osmolality and hyponatremia: Secondary | ICD-10-CM

## 2019-07-05 LAB — BASIC METABOLIC PANEL
Anion gap: 9 (ref 5–15)
BUN: 12 mg/dL (ref 8–23)
CO2: 23 mmol/L (ref 22–32)
Calcium: 8.8 mg/dL — ABNORMAL LOW (ref 8.9–10.3)
Chloride: 102 mmol/L (ref 98–111)
Creatinine, Ser: 1.06 mg/dL — ABNORMAL HIGH (ref 0.44–1.00)
GFR calc Af Amer: 55 mL/min — ABNORMAL LOW (ref >60)
GFR calc non Af Amer: 48 mL/min — ABNORMAL LOW (ref >60)
Glucose, Bld: 132 mg/dL — ABNORMAL HIGH (ref 70–99)
Potassium: 4.2 mmol/L (ref 3.5–5.1)
Sodium: 134 mmol/L — ABNORMAL LOW (ref 135–145)

## 2019-07-05 LAB — CBC WITH DIFFERENTIAL/PLATELET
Abs Immature Granulocytes: 0.06 10*3/uL (ref 0.00–0.07)
Basophils Absolute: 0 10*3/uL (ref 0.0–0.1)
Basophils Relative: 0 %
Eosinophils Absolute: 0 10*3/uL (ref 0.0–0.5)
Eosinophils Relative: 0 %
HCT: 24.3 % — ABNORMAL LOW (ref 36.0–46.0)
Hemoglobin: 8.3 g/dL — ABNORMAL LOW (ref 12.0–15.0)
Immature Granulocytes: 1 %
Lymphocytes Relative: 4 %
Lymphs Abs: 0.4 10*3/uL — ABNORMAL LOW (ref 0.7–4.0)
MCH: 32.4 pg (ref 26.0–34.0)
MCHC: 34.2 g/dL (ref 30.0–36.0)
MCV: 94.9 fL (ref 80.0–100.0)
Monocytes Absolute: 0.9 10*3/uL (ref 0.1–1.0)
Monocytes Relative: 8 %
Neutro Abs: 9.3 10*3/uL — ABNORMAL HIGH (ref 1.7–7.7)
Neutrophils Relative %: 87 %
Platelets: 119 10*3/uL — ABNORMAL LOW (ref 150–400)
RBC: 2.56 MIL/uL — ABNORMAL LOW (ref 3.87–5.11)
RDW: 14.2 % (ref 11.5–15.5)
WBC: 10.7 10*3/uL — ABNORMAL HIGH (ref 4.0–10.5)
nRBC: 0 % (ref 0.0–0.2)

## 2019-07-05 MED ORDER — SODIUM CHLORIDE 0.9 % IV BOLUS
1000.0000 mL | Freq: Once | INTRAVENOUS | Status: AC
  Administered 2019-07-05: 1000 mL via INTRAVENOUS

## 2019-07-05 MED ORDER — SODIUM CHLORIDE 0.9 % IV BOLUS
500.0000 mL | Freq: Once | INTRAVENOUS | Status: AC
  Administered 2019-07-05: 500 mL via INTRAVENOUS

## 2019-07-05 NOTE — Progress Notes (Signed)
PROGRESS NOTE    Brenda Reynolds  BJY:[id number] DOB: November 03, 1929 DOA: 07/03/2019 PCP: Toma Deiters, MD   Brief Narrative:  85 year old female who presented with left hip pain after mechanical fall.  She does have significant past medical history for hypertension, GERD and osteoporosis.  She suffered a mechanical fall while changing clothing, no loss of consciousness or head trauma.  On her initial physical examination pulse rate 89, respiratory rate 16, blood pressure 125/78, oxygen saturation 98% on room air.  She was awake and alert, lungs clear to auscultation bilaterally, heart S1-S2 present rhythm, abdomen soft, no lower extremity edema. Left hip films with a comminuted impacted left femoral head neck fracture extending through the greater and lesser trochanters.  Cortical step-off of the superior left pubic rami, likley nondisplaced fracture. SARS COVID 19 negative.   Patient was admitted to the hospital with working diagnosis of left femoral head neck fracture with left superior pubic rami fracture    Assessment & Plan:   Principal Problem:   Intertrochanteric fracture of left femur (HCC) Active Problems:   Hyponatremia   Hypertension   Osteoporosis   GERD (gastroesophageal reflux disease)   1. Acute left femoral neck fracture sp intramedullary fixation. Post op day 1  Was seen with PT this morning pain with ambulation as expected postoperatively Denies any nausea or shortness of breath Continue with postoperative hip protocol with physical therapy and DVT prophylaxis Further postoperative recommendations per orthopedics  2. HTN. Continue blood pressure control with losartan.  Mildly low this morning although variable  3. GERD. Continue proton pump inhibition,   4. Osteoporosis. Patient on fosamax at home.   5. Hyponatremia. Na this am 134, with preserved renal function serum cr 1,06 with K at 4,2 and serum bicarbonate at 23.  Monitor BMPs .   DVT prophylaxis:  Lovenox SQ  Code Status: full    Code Status Orders  (From admission, onward)         Start     Ordered   07/04/19 0022  Full code  Continuous     07/04/19 0027        Code Status History    This patient has a current code status but no historical code status.   Advance Care Planning Activity     Family Communication: Discussed in detail with patient Disposition Plan:   Remain inpatient for continued postoperative care, anticipated transition to skilled nursing facility for postoperative intensive physical therapy for gait mobility balance and transfer.  It is noted patient lives on the third floor of an apartment with no elevator on the stairs Consults called: None Admission status: Inpatient   Consultants:   Orthopedics  Procedures:  Pelvis Portable  Result Date: 07/04/2019 CLINICAL DATA:  ORIF proximal femur fracture EXAM: PORTABLE PELVIS 1-2 VIEWS COMPARISON:  Left hip radiographs from 1 day prior FINDINGS: Near-anatomic alignment of comminuted intertrochanteric left proximal femur fracture status post transfixation by intramedullary rod with interlocking left femoral neck pin and distal interlocking screw. Expected soft tissue gas surrounding the fracture site. No additional acute osseous fracture. No evidence of hip dislocation on this single frontal view. Healed deformities in the right superior and inferior pubic rami. No suspicious focal osseous lesions. IMPRESSION: Near-anatomic alignment of comminuted intertrochanteric left proximal femur fracture status post ORIF. Electronically Signed   By: Delbert Phenix M.D.   On: 07/04/2019 14:46   Dg Chest Portable 1 View  Result Date: 07/03/2019 CLINICAL DATA:  Broken left hip, preop EXAM:  PORTABLE CHEST 1 VIEW COMPARISON:  October 26, 2018 FINDINGS: There is mild cardiomegaly. The lungs are clear. No pneumothorax or pleural effusion. Healing posterior left third through sixth rib fractures are seen. There is also healing lateral  right seventh and eighth rib fractures seen. Advanced bilateral shoulder arthropathy seen. Again noted is flattening and cortical irregularity of the left humeral head. S-shaped scoliotic curvature seen. IMPRESSION: No acute cardiopulmonary process. Healing bilateral rib fractures Electronically Signed   By: Prudencio Pair M.D.   On: 07/03/2019 23:17   Dg Knee Left Port  Result Date: 07/03/2019 CLINICAL DATA:  Hip fracture, pain EXAM: PORTABLE LEFT KNEE - 1-2 VIEW COMPARISON:  None. FINDINGS: There is diffuse osteopenia. No definite fracture is seen. Linear calcifications seen in mediolateral joint space. No large knee joint effusion. 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 IMPRESSION: No acute osseous abnormality. Electronically Signed   By: Prudencio Pair M.D.   On: 07/03/2019 23:21   Dg Tibia/fibula Left Port  Result Date: 07/03/2019 CLINICAL DATA:  Pain, hip fracture EXAM: PORTABLE LEFT TIBIA AND FIBULA - 2 VIEW COMPARISON:  None. FINDINGS: No definite fracture or dislocation. No significant overlying soft tissue swelling. IMPRESSION: No acute osseous injury. Electronically Signed   By: Prudencio Pair M.D.   On: 07/03/2019 23:22   Dg C-arm 1-60 Min  Result Date: 07/04/2019 CLINICAL DATA:  Left trochanteric intramedullary nail. EXAM: OPERATIVE LEFT HIP (WITH PELVIS IF PERFORMED) 2 VIEWS TECHNIQUE: Fluoroscopic spot image(s) were submitted for interpretation post-operatively. COMPARISON:  07/03/2019 FINDINGS: Examination demonstrates internal fixation with intramedullary nail and associated screw bridging patient's intertrochanteric fracture into the femoral head. Hardware is intact as there is near anatomic alignment about the fracture site. Remainder the exam is unchanged. IMPRESSION: Fixation of patient's known left intertrochanteric fracture with hardware intact and near anatomic alignment over the fracture site. Electronically Signed   By: Marin Olp M.D.   On: 07/04/2019 11:10   Dg Hip Port  Unilat With Pelvis 1v Left  Result Date: 07/03/2019 CLINICAL DATA:  Hip fracture EXAM: DG HIP (WITH OR WITHOUT PELVIS) 1V PORT LEFT COMPARISON:  None. FINDINGS: There is a comminuted impacted fracture of the left femoral head neck junction which extends through the greater and lesser trochanters. There is rotation at the femoral head on the acetabulum, however it is still well seated within the acetabulum. There is healing right superior and inferior pubic rami fractures. There is cortical step-off seen at the left superior pubic rami, likely nondisplaced fracture. Degenerative changes in the lower lumbar spine. IMPRESSION: Comminuted impacted left femoral head neck fracture extending through the greater and lesser trochanters. Cortical step-off at the superior left pubic rami, likely nondisplaced fracture Healing right superior and inferior pubic rami fractures. Electronically Signed   By: Prudencio Pair M.D.   On: 07/03/2019 23:20   Dg Hip Operative Unilat W Or W/o Pelvis Left  Result Date: 07/04/2019 CLINICAL DATA:  Left trochanteric intramedullary nail. EXAM: OPERATIVE LEFT HIP (WITH PELVIS IF PERFORMED) 2 VIEWS TECHNIQUE: Fluoroscopic spot image(s) were submitted for interpretation post-operatively. COMPARISON:  07/03/2019 FINDINGS: Examination demonstrates internal fixation with intramedullary nail and associated screw bridging patient's intertrochanteric fracture into the femoral head. Hardware is intact as there is near anatomic alignment about the fracture site. Remainder the exam is unchanged. IMPRESSION: Fixation of patient's known left intertrochanteric fracture with hardware intact and near anatomic alignment over the fracture site. Electronically Signed   By: Marin Olp M.D.   On: 07/04/2019 11:10  Antimicrobials:   Perioperative per orthopedics   Subjective: Patient seen this morning with physical therapy as she got out of bed Ambulated as expected postoperatively Controlled  pain  Objective: Vitals:   07/05/19 0224 07/05/19 0342 07/05/19 0353 07/05/19 0830  BP: 121/70 113/64  (!) 96/57  Pulse: 96 96 91 99  Resp:  16  18  Temp:  98.6 F (37 C)  98.2 F (36.8 C)  TempSrc:      SpO2:  (!) 89% 95% 93%  Weight:      Height:        Intake/Output Summary (Last 24 hours) at 07/05/2019 0857 Last data filed at 07/04/2019 2343 Gross per 24 hour  Intake 1530 ml  Output 1100 ml  Net 430 ml   Filed Weights   07/03/19 2155 07/04/19 0304  Weight: 58.5 kg 58 kg    Examination:  General exam: Appears calm and comfortable  Respiratory system: Clear to auscultation. Respiratory effort normal. Cardiovascular system: S1 & S2 heard, RRR. No JVD, murmurs, rubs, gallops or clicks. No pedal edema. Gastrointestinal system: Abdomen is nondistended, soft and nontender. No organomegaly or masses felt. Normal bowel sounds heard. Central nervous system: Alert and oriented. No focal neurological deficits. Extremities: Midly limited range of motion secondary to postoperative pain, warm well perfused, neurovascularly intact Skin: No rashes, lesions or ulcers Psychiatry: Judgement and insight appear normal. Mood & affect appropriate.     Data Reviewed: I have personally reviewed following labs and imaging studies  CBC: Recent Labs  Lab 07/03/19 2304 07/04/19 0525 07/04/19 1642 07/05/19 0456  WBC 10.3 9.0 10.0 10.7*  NEUTROABS 8.7*  --   --  9.3*  HGB 12.8 12.0 10.0* 8.3*  HCT 37.8 36.5 29.7* 24.3*  MCV 95.9 96.3 96.1 94.9  PLT 177 163 139* 119*   Basic Metabolic Panel: Recent Labs  Lab 07/03/19 2304 07/04/19 0525 07/04/19 1642 07/05/19 0456  NA 130* 133*  --  134*  K 4.6 4.2  --  4.2  CL 95* 99  --  102  CO2 24 23  --  23  GLUCOSE 126* 158*  --  132*  BUN 11 13  --  12  CREATININE 1.09* 1.14* 1.22* 1.06*  CALCIUM 9.3 9.3  --  8.8*   GFR: Estimated Creatinine Clearance: 30.4 mL/min (A) (by C-G formula based on SCr of 1.06 mg/dL (H)). Liver Function  Tests: Recent Labs  Lab 07/03/19 2304 07/04/19 0525  AST 26 23  ALT 12 14  ALKPHOS 45 46  BILITOT 1.9* 1.2  PROT 6.0* 5.6*  ALBUMIN 3.6 3.4*   No results for input(s): LIPASE, AMYLASE in the last 168 hours. No results for input(s): AMMONIA in the last 168 hours. Coagulation Profile: Recent Labs  Lab 07/03/19 2304  INR 1.0   Cardiac Enzymes: No results for input(s): CKTOTAL, CKMB, CKMBINDEX, TROPONINI in the last 168 hours. BNP (last 3 results) No results for input(s): PROBNP in the last 8760 hours. HbA1C: No results for input(s): HGBA1C in the last 72 hours. CBG: No results for input(s): GLUCAP in the last 168 hours. Lipid Profile: No results for input(s): CHOL, HDL, LDLCALC, TRIG, CHOLHDL, LDLDIRECT in the last 72 hours. Thyroid Function Tests: No results for input(s): TSH, T4TOTAL, FREET4, T3FREE, THYROIDAB in the last 72 hours. Anemia Panel: No results for input(s): VITAMINB12, FOLATE, FERRITIN, TIBC, IRON, RETICCTPCT in the last 72 hours. Sepsis Labs: No results for input(s): PROCALCITON, LATICACIDVEN in the last 168 hours.  Recent Results (from  the past 240 hour(s))  SARS Coronavirus 2 (Hospital order, Performed in [facility] lab)     Status: None   Collection Time: 07/04/19 12:50 AM  Result Value Ref Range Status   SARS Coronavirus 2 NEGATIVE NEGATIVE Final    [facility]: (NOTE) If result is NEGATIVE SARS-CoV-2 target nucleic acids are NOT DETECTED. The SARS-CoV-2 RNA is generally detectable in upper and lower  respiratory specimens during the acute phase of infection. The lowest  concentration of SARS-CoV-2 viral copies this assay can detect is 250  copies / mL. A negative result does not preclude SARS-CoV-2 infection  and should not be used as the sole basis for treatment or other  patient management decisions.  A negative result may occur with  improper specimen collection / handling, submission of specimen other  than nasopharyngeal swab, presence  of viral mutation(s) within the  areas targeted by this assay, and inadequate number of viral copies  (<250 copies / mL). A negative result must be combined with clinical  observations, patient history, and epidemiological information. If result is POSITIVE SARS-CoV-2 target nucleic acids are DETECTED. The SARS-CoV-2 RNA is generally detectable in upper and lower  respiratory specimens dur ing the acute phase of infection.  Positive  results are indicative of active infection with SARS-CoV-2.  Clinical  correlation with patient history and other diagnostic information is  necessary to determine patient infection status.  Positive results do  not rule out bacterial infection or co-infection with other viruses. If result is PRESUMPTIVE POSTIVE SARS-CoV-2 nucleic acids MAY BE PRESENT.   A presumptive positive result was obtained on the submitted specimen  and confirmed on repeat testing.  While 2019 novel coronavirus  (SARS-CoV-2) nucleic acids may be present in the submitted sample  additional confirmatory testing may be necessary for epidemiological  and / or clinical management purposes  to differentiate between  SARS-CoV-2 and other Sarbecovirus currently known to infect humans.  If clinically indicated additional testing with an alternate test  methodology [phone] is advised. The SARS-CoV-2 RNA is generally  detectable in upper and lower respiratory sp ecimens during the acute  phase of infection. The expected result is Negative. Fact Sheet for Patients:  BoilerBrush.com.cyhttps://www.fda.gov/media/136312/download Fact Sheet for Healthcare Providers: https://pope.com/https://www.fda.gov/media/136313/download This test is not yet approved or cleared by the [place] FDA and has been authorized for detection and/or diagnosis of SARS-CoV-2 by FDA under an Emergency Use Authorization (EUA).  This EUA will remain in effect (meaning this test can be used) for the duration of the COVID-19 declaration under Section  564(b)(1) of the Act, 21 U.S.C. section 360bbb-3(b)(1), unless the authorization is terminated or revoked sooner. Performed at [facility], [address] [place], [place] [address]   Surgical pcr screen     Status: None   Collection Time: 07/04/19  3:55 AM   Specimen: Nasal Mucosa; Nasal Swab  Result Value Ref Range Status   MRSA, PCR NEGATIVE NEGATIVE Final   Staphylococcus aureus NEGATIVE NEGATIVE Final    Comment: (NOTE) The Xpert SA Assay (FDA approved for NASAL specimens in patients 83 years of age and older), is one component of a comprehensive surveillance program. It is not intended to diagnose infection nor to guide or monitor treatment. Performed at [facility], [address] [place], [place] [address]          Radiology Studies: Pelvis Portable  Result Date: 07/04/2019 CLINICAL DATA:  ORIF proximal femur fracture EXAM: PORTABLE PELVIS 1-2 VIEWS COMPARISON:  Left hip radiographs from 1 day prior FINDINGS: Near-anatomic alignment of comminuted intertrochanteric left proximal femur fracture status post transfixation by intramedullary rod with interlocking left femoral neck pin and distal interlocking screw. Expected soft tissue gas surrounding the fracture site. No additional acute osseous fracture. No evidence of hip dislocation on this single frontal view. Healed deformities in the right superior and inferior pubic rami. No suspicious focal osseous lesions. IMPRESSION: Near-anatomic alignment of comminuted intertrochanteric left proximal femur fracture status post ORIF. Electronically Signed   By: Delbert Phenix M.D.   On: 07/04/2019 14:46   Dg Chest Portable 1 View  Result Date: 07/03/2019 CLINICAL DATA:  Broken left hip, preop EXAM: PORTABLE CHEST 1 VIEW COMPARISON:  October 26, 2018 FINDINGS: There is mild cardiomegaly. The lungs are clear. No pneumothorax or pleural effusion. Healing posterior left third through sixth rib fractures are seen. There is  also healing lateral right seventh and eighth rib fractures seen. Advanced bilateral shoulder arthropathy seen. Again noted is flattening and cortical irregularity of the left humeral head. S-shaped scoliotic curvature seen. IMPRESSION: No acute cardiopulmonary process. Healing bilateral rib fractures Electronically Signed   By: Jonna Clark M.D.   On: 07/03/2019 23:17   Dg Knee Left Port  Result Date: 07/03/2019 CLINICAL DATA:  Hip fracture, pain EXAM: PORTABLE LEFT KNEE - 1-2 VIEW COMPARISON:  None. FINDINGS: There is diffuse osteopenia. No definite fracture is seen. Linear calcifications seen in mediolateral joint space. No large knee joint effusion. 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 IMPRESSION: No acute osseous abnormality. Electronically Signed   By: Jonna Clark M.D.   On: 07/03/2019 23:21   Dg Tibia/fibula Left Port  Result Date: 07/03/2019 CLINICAL DATA:  Pain, hip fracture EXAM: PORTABLE LEFT TIBIA AND FIBULA - 2 VIEW COMPARISON:  None. FINDINGS: No definite fracture or dislocation. No significant overlying soft tissue swelling. IMPRESSION: No acute osseous injury. Electronically Signed   By: Jonna Clark M.D.   On: 07/03/2019 23:22   Dg C-arm 1-60 Min  Result Date: 07/04/2019 CLINICAL DATA:  Left trochanteric intramedullary nail. EXAM: OPERATIVE LEFT HIP (WITH PELVIS IF PERFORMED) 2 VIEWS TECHNIQUE: Fluoroscopic spot image(s) were submitted for interpretation post-operatively. COMPARISON:  07/03/2019 FINDINGS: Examination demonstrates internal fixation with intramedullary nail and associated screw bridging patient's intertrochanteric fracture into the femoral head. Hardware is intact as there is near anatomic alignment about the fracture site. Remainder the exam is unchanged. IMPRESSION: Fixation of patient's known left intertrochanteric fracture with hardware intact and near anatomic alignment over the fracture site. Electronically Signed   By: Elberta Fortis M.D.   On: 07/04/2019 11:10     Dg Hip Port Unilat With Pelvis 1v Left  Result Date: 07/03/2019 CLINICAL DATA:  Hip fracture EXAM: DG HIP (WITH OR WITHOUT PELVIS) 1V PORT LEFT COMPARISON:  None. FINDINGS: There is a comminuted impacted fracture of the left femoral head neck junction which extends through the greater and lesser trochanters. There is rotation at the femoral head on the acetabulum, however it is still well seated within the acetabulum. There is healing right superior and inferior pubic rami fractures. There is cortical step-off seen at the left superior pubic rami, likely nondisplaced fracture. Degenerative changes in the lower lumbar spine. IMPRESSION: Comminuted impacted left femoral head neck fracture extending through the greater and lesser trochanters. Cortical step-off at the superior left pubic rami, likely nondisplaced fracture Healing right superior and inferior pubic rami fractures. Electronically Signed   By: Jonna Clark M.D.   On: 07/03/2019 23:20  Dg Hip Operative Unilat W Or W/o Pelvis Left  Result Date: 07/04/2019 CLINICAL DATA:  Left trochanteric intramedullary nail. EXAM: OPERATIVE LEFT HIP (WITH PELVIS IF PERFORMED) 2 VIEWS TECHNIQUE: Fluoroscopic spot image(s) were submitted for interpretation post-operatively. COMPARISON:  07/03/2019 FINDINGS: Examination demonstrates internal fixation with intramedullary nail and associated screw bridging patient's intertrochanteric fracture into the femoral head. Hardware is intact as there is near anatomic alignment about the fracture site. Remainder the exam is unchanged. IMPRESSION: Fixation of patient's known left intertrochanteric fracture with hardware intact and near anatomic alignment over the fracture site. Electronically Signed   By: Elberta Fortis M.D.   On: 07/04/2019 11:10        Scheduled Meds:  docusate sodium  100 mg Oral BID   enoxaparin (LOVENOX) injection  30 mg Subcutaneous Q24H   famotidine  20 mg Oral Daily   folic acid  0.5 mg  Oral Daily   gabapentin  300 mg Oral BID   losartan  100 mg Oral Daily   senna  1 tablet Oral BID   sodium chloride flush  3 mL Intravenous Q12H   vitamin B-12  500 mcg Oral Daily   Continuous Infusions:   LOS: 1 day    Time spent: 35 min     Burke Keels, MD [facility]  If 7PM-7AM, please contact night-coverage  07/05/2019, 8:57 AM

## 2019-07-05 NOTE — Progress Notes (Signed)
Patient BP 87/58 pulse 101. Dr. Silas [place] notified, ordered NS bolus (1,057mL)

## 2019-07-05 NOTE — Progress Notes (Signed)
    Subjective:  Patient reports pain as mild to moderate.  Denies N/V/CP/SOB.   Objective:   VITALS:   Vitals:   07/05/19 0342 07/05/19 0353 07/05/19 0830 07/05/19 1034  BP: 113/64  (!) 96/57 108/62  Pulse: 96 91 99 87  Resp: 16  18   Temp: 98.6 F (37 C)  98.2 F (36.8 C)   TempSrc:      SpO2: (!) 89% 95% 93% 98%  Weight:      Height:        NAD ABD soft Sensation intact distally Intact pulses distally Dorsiflexion/Plantar flexion intact Incision: dressing C/D/I Compartment soft   Lab Results  Component Value Date   WBC 10.7 (H) 07/05/2019   HGB 8.3 (L) 07/05/2019   HCT 24.3 (L) 07/05/2019   MCV 94.9 07/05/2019   PLT 119 (L) 07/05/2019   BMET    Component Value Date/Time   NA 134 (L) 07/05/2019 0456   K 4.2 07/05/2019 0456   CL 102 07/05/2019 0456   CO2 23 07/05/2019 0456   GLUCOSE 132 (H) 07/05/2019 0456   BUN 12 07/05/2019 0456   CREATININE 1.06 (H) 07/05/2019 0456   CALCIUM 8.8 (L) 07/05/2019 0456   GFRNONAA 48 (L) 07/05/2019 0456   GFRAA 55 (L) 07/05/2019 0456     Assessment/Plan: 1 Day Post-Op   Principal Problem:   Intertrochanteric fracture of left femur (HCC) Active Problems:   Hyponatremia   Hypertension   Osteoporosis   GERD (gastroesophageal reflux disease)   WBAT with walker DVT ppx: Lovenox, SCDs, TEDS PO pain control PT/OT Dispo: D/C planning   Brenda Reynolds 07/05/2019, 12:10 PM   Rod Can, MD Cell: [phone] [place] [facility]  [facility] [address], [place], [place] [address] Phone: [phone] [email or web address] Facebook  [employer]

## 2019-07-05 NOTE — Progress Notes (Signed)
NS bolus given at 0116, VS rechecked BP 121/70 pulse 96. Will continue to monitor.

## 2019-07-05 NOTE — Evaluation (Signed)
Physical Therapy Evaluation Patient Details Name: Brenda Reynolds MRN: [id number] DOB: 1929-10-06 Today's Date: 07/05/2019   History of Present Illness  85 yo admitted after fall at home with left femur fx s/p IM nail. PMhx: HTN, GERD, osteoporosis  Clinical Impression  Pt pleasant and reports only mild soreness. Pt reports living alone in 3rd story apartment without elevator who does not drive. Pt currently requires min assist for all activity and only limited to 15' of gait this session. Pt with decreased strength, balance, gait and functional mobility who will benefit from acute therapy to maximize mobility, function and safety to decrease burden of care and fall risk.     Follow Up Recommendations SNF;Supervision/Assistance - 24 hour    Equipment Recommendations  Rolling walker with 5" wheels;3in1 (PT)    Recommendations for Other Services OT consult     Precautions / Restrictions Precautions Precautions: Fall Restrictions Weight Bearing Restrictions: Yes LLE Weight Bearing: Weight bearing as tolerated      Mobility  Bed Mobility Overal bed mobility: Needs Assistance Bed Mobility: Supine to Sit     Supine to sit: Min assist     General bed mobility comments: assist to pivot to EOB and elevate trunk with increased time and cues for sequence  Transfers Overall transfer level: Needs assistance   Transfers: Sit to/from Stand Sit to Stand: Min assist;Mod assist         General transfer comment: mod assist to rise initially from bed with repeated trial from [facility] with min assist. Cues for hand placement, sequence and safety  Ambulation/Gait Ambulation/Gait assistance: Min assist Gait Distance (Feet): 15 Feet Assistive device: Rolling walker (2 wheeled) Gait Pattern/deviations: Step-through pattern;Decreased stride length;Trunk flexed   Gait velocity interpretation: 1.31 - 2.62 ft/sec, indicative of limited community ambulator General Gait Details: cues for posture,  sequence and safety. reliance on RW with pt walking 15' x 2 trials  Stairs            Wheelchair Mobility    Modified Rankin (Stroke Patients Only)       Balance Overall balance assessment: Needs assistance Sitting-balance support: No upper extremity supported;Feet supported Sitting balance-Leahy Scale: Fair Sitting balance - Comments: EOB with supervision     Standing balance-Leahy Scale: Poor Standing balance comment: min assist at sink for static standing with tendency for posterior lean, RW for gt                             Pertinent Vitals/Pain Pain Assessment: 0-10 Pain Score: 2  Pain Location: left hip Pain Descriptors / Indicators: Sore Pain Intervention(s): Limited activity within patient's tolerance;Monitored during session;Repositioned    Home Living Family/patient expects to be discharged to:: Private residence Living Arrangements: Alone Available Help at Discharge: Friend(s);Available PRN/intermittently Type of Home: Apartment Home Access: Stairs to enter   Entrance Stairs-Number of Steps: 2 flights Home Layout: One level Home Equipment: Walker - 2 wheels;Cane - single point      Prior Function Level of Independence: Independent with assistive device(s)         Comments: uses DME on occasion, has assist for grocery and doctors, doesn't drive     Hand Dominance        Extremity/Trunk Assessment        Lower Extremity Assessment LLE Deficits / Details: decreased ROM and strength post op    Cervical / Trunk Assessment Cervical / Trunk Assessment: Kyphotic  Communication   Communication:  HOH  Cognition Arousal/Alertness: Awake/alert Behavior During Therapy: WFL for tasks assessed/performed Overall Cognitive Status: Impaired/Different from baseline Area of Impairment: Safety/judgement;Memory                     Memory: Decreased short-term memory   Safety/Judgement: Decreased awareness of deficits;Decreased  awareness of safety     General Comments: pt slightly tangential and unaware of why she cannot return home right away alone      General Comments      Exercises     Assessment/Plan    PT Assessment Patient needs continued PT services  PT Problem List Decreased strength;Decreased mobility;Decreased safety awareness;Decreased range of motion;Decreased activity tolerance;Decreased balance;Decreased knowledge of use of DME;Decreased cognition       PT Treatment Interventions DME instruction;Therapeutic activities;Gait training;Therapeutic exercise;Patient/family education;Stair training;Balance training;Functional mobility training    PT Goals (Current goals can be found in the Care Plan section)  Acute Rehab PT Goals Patient Stated Goal: return home PT Goal Formulation: With patient Time For Goal Achievement: 07/19/19 Potential to Achieve Goals: Fair    Frequency Min 4X/week   Barriers to discharge Decreased caregiver support      Co-evaluation               AM-PAC PT "6 Clicks" Mobility  Outcome Measure Help needed turning from your back to your side while in a flat bed without using bedrails?: A Little Help needed moving from lying on your back to sitting on the side of a flat bed without using bedrails?: A Little Help needed moving to and from a bed to a chair (including a wheelchair)?: A Little Help needed standing up from a chair using your arms (e.g., wheelchair or bedside chair)?: A Little Help needed to walk in hospital room?: A Little Help needed climbing 3-5 steps with a railing? : A Lot 6 Click Score: 17    End of Session Equipment Utilized During Treatment: Gait belt Activity Tolerance: Patient tolerated treatment well Patient left: in chair;with call bell/phone within reach;with chair alarm set Nurse Communication: Mobility status;Weight bearing status PT Visit Diagnosis: Other abnormalities of gait and mobility (R26.89);Muscle weakness (generalized)  (M62.81);History of falling (Z91.81);Unsteadiness on feet (R26.81)    Time: 3220-2542 PT Time Calculation (min) (ACUTE ONLY): 30 min   Charges:   PT Evaluation $PT Eval Moderate Complexity: 1 Mod PT Treatments $Gait Training: 8-22 mins        Rocco Kerkhoff Pam Drown, PT Acute Rehabilitation Services Pager: [phone] Office: [place] 07/05/2019, 12:39 PM

## 2019-07-05 NOTE — Plan of Care (Signed)

## 2019-07-06 LAB — CBC
HCT: 24.9 % — ABNORMAL LOW (ref 36.0–46.0)
Hemoglobin: 8.1 g/dL — ABNORMAL LOW (ref 12.0–15.0)
MCH: 31.6 pg (ref 26.0–34.0)
MCHC: 32.5 g/dL (ref 30.0–36.0)
MCV: 97.3 fL (ref 80.0–100.0)
Platelets: 123 10*3/uL — ABNORMAL LOW (ref 150–400)
RBC: 2.56 MIL/uL — ABNORMAL LOW (ref 3.87–5.11)
RDW: 14.6 % (ref 11.5–15.5)
WBC: 11 10*3/uL — ABNORMAL HIGH (ref 4.0–10.5)
nRBC: 0 % (ref 0.0–0.2)

## 2019-07-06 LAB — SARS CORONAVIRUS 2 (TAT 6-24 HRS): SARS Coronavirus 2: NEGATIVE

## 2019-07-06 LAB — BASIC METABOLIC PANEL
Anion gap: 9 (ref 5–15)
BUN: 12 mg/dL (ref 8–23)
CO2: 23 mmol/L (ref 22–32)
Calcium: 8.6 mg/dL — ABNORMAL LOW (ref 8.9–10.3)
Chloride: 104 mmol/L (ref 98–111)
Creatinine, Ser: 0.88 mg/dL (ref 0.44–1.00)
GFR calc Af Amer: >60 mL/min (ref >60)
GFR calc non Af Amer: 59 mL/min — ABNORMAL LOW (ref >60)
Glucose, Bld: 93 mg/dL (ref 70–99)
Potassium: 4.2 mmol/L (ref 3.5–5.1)
Sodium: 136 mmol/L (ref 135–145)

## 2019-07-06 MED ORDER — ENOXAPARIN SODIUM 40 MG/0.4ML ~~LOC~~ SOLN
40.0000 mg | SUBCUTANEOUS | Status: DC
  Administered 2019-07-07 – 2019-07-08 (×2): 40 mg via SUBCUTANEOUS
  Filled 2019-07-06 (×2): qty 0.4

## 2019-07-06 MED ORDER — LOSARTAN POTASSIUM 50 MG PO TABS
25.0000 mg | ORAL_TABLET | Freq: Every day | ORAL | Status: DC
  Administered 2019-07-07 – 2019-07-08 (×2): 25 mg via ORAL
  Filled 2019-07-06 (×2): qty 1

## 2019-07-06 NOTE — Clinical Social Work Note (Signed)
CSW talked with Deliah Boston., admissions director at [facility] (12:53 pm) regarding patient's need for ST rehab and preference for her facility. Mardene Celeste also advised Mardene Celeste regarding incorrect information in H&P regarding cocaine use.  1:22 pm - CSW received call from [place] at [facility] and she will have business office check her insurance and initiate insurance authorization.  CSW will continue to follow, provided SW intervention services as needed and facilitate discharge to SNF once insurance auth rec'd and COVID results in.  Kaeden Mester Givens, MSW, LCSW Licensed Clinical Social Worker [place] [phone]

## 2019-07-06 NOTE — TOC Initial Note (Signed)
Transition of Care [facility]) - Initial/Assessment Note    Patient Details  Name: Brenda Reynolds MRN: [id number] Date of Birth: June 12, 1930  Transition of Care [facility]) CM/SW Contact:    Cristobal Goldmann, LCSW Phone Number: 07/06/2019, 1:40 PM  Clinical Narrative:  CSW completed assessment with patient on 10/5 at the bedside. Patient was sitting up in bed and was awake, alert, pleasant and engaged easily in conversation with CSW regarding her discharge plan. Patient was oriented X4, however at times during the conversation, patient's comments indicated a bit of confusion.  Ms. Kellen reported that she lives alone in an apartment on the 3rd floor and her manager wants to move her to an apartment on the first floor. When asked, patient responded that she has no children and is the only person left in her family. She did report that her step-son brought her to the hospital. She indicated that Ms. Teodoro Kil and [place] in [place], [place] [phone]) are her friends. When asked, CSW was given permission to contact either of her friends.     CSW talked with patient regarding the recommendation for ST rehab and she is in agreement. Mrs. Komatsu was provided with the SNF list for [facility] and informed CSW that she wants [facility] as she has been to that hospital before.   07/06/19: Visited with patient to talk with her about the indication in the medical chart (H&P) that she uses cocaine. Patient denied cocaine or any drug use and again indicated that she drinks a glass of wine occasionally and stopped smoking years ago. CSW explained that this information will be shared with the skilled facility to alleviate any concerns they may have.          Expected Discharge Plan: Skilled Nursing Facility Barriers to Discharge: Insurance Authorization   Patient Goals and CMS Choice Patient states their goals for this hospitalization and ongoing recovery are:: Patient reported that  she lives alone and acknowledged that she needs some rehab before going home, expecially due to her history of falls CMS Medicare.gov Compare Post Acute Care list provided to:: Patient Choice offered to / list presented to : Patient  Expected Discharge Plan and Services Expected Discharge Plan: Skilled Nursing Facility       Living arrangements for the past 2 months: Apartment                                     Prior Living Arrangements/Services Living arrangements for the past 2 months: Apartment Lives with:: Self Patient language and need for interpreter reviewed:: No Do you feel safe going back to the place where you live?: No   Patient reported that she lives alone  Need for Family Participation in Patient Care: No (Comment) Care giver support system in place?: No (comment)   Criminal Activity/Legal Involvement Pertinent to Current Situation/Hospitalization: No - Comment as needed  Activities of Daily Living Home Assistive Devices/Equipment: Cane (specify quad or straight) ADL Screening (condition at time of admission) Patient's cognitive ability adequate to safely complete daily activities?: Yes Is the patient deaf or have difficulty hearing?: No Does the patient have difficulty seeing, even when wearing glasses/contacts?: No Does the patient have difficulty concentrating, remembering, or making decisions?: No Patient able to express need for assistance with ADLs?: Yes Does the patient have difficulty dressing or bathing?: No Independently performs ADLs?: Yes (appropriate for developmental age) Does the patient  have difficulty walking or climbing stairs?: Yes Weakness of Legs: Left Weakness of Arms/Hands: None  Permission Sought/Granted Permission sought to share information with : Other (comment) Permission granted to share information with : Yes, Verbal Permission Granted  Share Information with NAME: Eugenie Filler     Permission granted to share info w  Relationship: Friend  Permission granted to share info w Contact Information: [phone]  Emotional Assessment Appearance:: Appears stated age Attitude/Demeanor/Rapport: Engaged Affect (typically observed): Appropriate, Pleasant Orientation: : Oriented to Self, Oriented to Place, Oriented to  Time, Oriented to Situation Alcohol / Substance Use: Tobacco Use, Alcohol Use, Illicit Drugs(Patient reported that she stopped smoking many years ago (did not remember date or year). She reported an occassional glass of wine. CSW talked with her regarding the report of cocaine use on the H&P and patient denies any previous or current drug use) Psych Involvement: No (comment)  Admission diagnosis:  Pain [R52] Closed fracture of left hip, initial encounter ([place]) [S72.002A] Closed comminuted intertrochanteric fracture of left femur ([facility]) [S72.142A] Closed fracture of superior ramus of left pubis, initial encounter [facility]) [S32.512A] Patient Active Problem List   Diagnosis Date Noted  . Intertrochanteric fracture of left femur ([place]) 07/04/2019  . Hyponatremia 07/04/2019  . Hypertension 07/04/2019  . Osteoporosis 07/04/2019  . GERD (gastroesophageal reflux disease) 07/04/2019   PCP:  Neale Burly, MD Pharmacy:   [facility], [place] [address] Phone: [phone] Fax: [phone]     Social Determinants of Health (SDOH) Interventions  No SDOH interventions needed at this time.  Readmission Risk Interventions No flowsheet data found.

## 2019-07-06 NOTE — Progress Notes (Signed)
PROGRESS NOTE        PATIENT DETAILS Name: Brenda Reynolds Age: 85 y.o. Sex: female Date of Birth: 1930/03/13 Admit Date: 07/03/2019 Admitting Physician Pearson Grippe, MD ZOX:[id number], Myra Gianotti, MD  Brief Narrative: Patient is a 85 y.o. female with history of HTN, GERD, osteoporosis-who presented with left hip pain following a mechanical fall-found to have left hip fracture.  Subsequently admitted for further eval and treatment.  Subjective: Pain at the operative site-but no other issues.  Assessment/Plan: Intertrochanteric fracture of the left femur: Following a mechanical fall-s/p intramedullary fixation on 10/4.  Recommendations from orthopedics are to continue with Lovenox for VT prophylaxis, weightbearing as tolerated with walker.  Social worker following for SNF placement.  Ortho following.  Anemia: Secondary to perioperative blood loss-follow hemoglobin-transfuse if less than 7.  HTN: Blood pressure soft-decrease losartan to 25 mg.  Follow  GERD: Continue Pepcid  History of osteoporosis: Resume Fosamax on discharge.  Diet: Diet Order            Diet regular Room service appropriate? Yes; Fluid consistency: Thin  Diet effective now               DVT Prophylaxis: Prophylactic Lovenox   Code Status: Full code   Family Communication: None at bedside  Disposition Plan: Remain inpatient-SNF on discharge-over the next few days.  Antimicrobial agents: Anti-infectives (From admission, onward)   Start     Dose/Rate Route Frequency Ordered Stop   07/04/19 1630  ceFAZolin (ANCEF) IVPB 2g/100 mL premix     2 g 200 mL/hr over 30 Minutes Intravenous Every 6 hours 07/04/19 1626 07/04/19 2249   07/04/19 0600  ceFAZolin (ANCEF) IVPB 2g/100 mL premix     2 g 200 mL/hr over 30 Minutes Intravenous On call to O.R. 07/04/19 0316 07/04/19 0850      Procedures: See above  CONSULTS:  orthopedic surgery  Time spent: 25- minutes-Greater than 50% of  this time was spent in counseling, explanation of diagnosis, planning of further management, and coordination of care.  MEDICATIONS: Scheduled Meds:  docusate sodium  100 mg Oral BID   enoxaparin (LOVENOX) injection  30 mg Subcutaneous Q24H   famotidine  20 mg Oral Daily   folic acid  0.5 mg Oral Daily   gabapentin  300 mg Oral BID   losartan  100 mg Oral Daily   senna  1 tablet Oral BID   sodium chloride flush  3 mL Intravenous Q12H   vitamin B-12  500 mcg Oral Daily   Continuous Infusions: PRN Meds:.acetaminophen, HYDROcodone-acetaminophen, HYDROcodone-acetaminophen, HYDROmorphone (DILAUDID) injection, menthol-cetylpyridinium **OR** phenol, metoCLOPramide **OR** metoCLOPramide (REGLAN) injection, ondansetron **OR** ondansetron (ZOFRAN) IV   PHYSICAL EXAM: Vital signs: Vitals:   07/05/19 1949 07/06/19 0056 07/06/19 0343 07/06/19 0754  BP: (!) 85/47 135/73 112/70 124/74  Pulse: (!) 103 (!) 103 92 86  Resp: Temp: 98.3 F (36.8 C)  98.2 F (36.8 C) 97.7 F (36.5 C)  TempSrc: Oral  Oral Oral  SpO2: 95%  (!) 89% 95%  Weight:      Height:       Filed Weights   07/03/19 2155 07/04/19 0304  Weight: 58.5 kg 58 kg   Body mass index is 24.96 kg/m.   Gen Exam:Alert awake-not in any distress HEENT:atraumatic, normocephalic Chest: B/L clear to auscultation anteriorly CVS:S1S2 regular Abdomen:soft  non tender, non distended Extremities:no edema Neurology: Non focal Skin: no rash  I have personally reviewed following labs and imaging studies  LABORATORY DATA: CBC: Recent Labs  Lab 07/03/19 2304 07/04/19 0525 07/04/19 1642 07/05/19 0456 07/06/19 0429  WBC 10.3 9.0 10.0 10.7* 11.0*  NEUTROABS 8.7*  --   --  9.3*  --   HGB 12.8 12.0 10.0* 8.3* 8.1*  HCT 37.8 36.5 29.7* 24.3* 24.9*  MCV 95.9 96.3 96.1 94.9 97.3  PLT 177 163 139* 119* 123*    Basic Metabolic Panel: Recent Labs  Lab 07/03/19 2304 07/04/19 0525 07/04/19 1642 07/05/19 0456  07/06/19 0429  NA 130* 133*  --  134* 136  K 4.6 4.2  --  4.2 4.2  CL 95* 99  --  102 104  CO2 24 23  --  23 23  GLUCOSE 126* 158*  --  132* 93  BUN 11 13  --  12 12  CREATININE 1.09* 1.14* 1.22* 1.06* 0.88  CALCIUM 9.3 9.3  --  8.8* 8.6*    GFR: Estimated Creatinine Clearance: 36.6 mL/min (by C-G formula based on SCr of 0.88 mg/dL).  Liver Function Tests: Recent Labs  Lab 07/03/19 2304 07/04/19 0525  AST 26 23  ALT 12 14  ALKPHOS 45 46  BILITOT 1.9* 1.2  PROT 6.0* 5.6*  ALBUMIN 3.6 3.4*   No results for input(s): LIPASE, AMYLASE in the last 168 hours. No results for input(s): AMMONIA in the last 168 hours.  Coagulation Profile: Recent Labs  Lab 07/03/19 2304  INR 1.0    Cardiac Enzymes: No results for input(s): CKTOTAL, CKMB, CKMBINDEX, TROPONINI in the last 168 hours.  BNP (last 3 results) No results for input(s): PROBNP in the last 8760 hours.  HbA1C: No results for input(s): HGBA1C in the last 72 hours.  CBG: No results for input(s): GLUCAP in the last 168 hours.  Lipid Profile: No results for input(s): CHOL, HDL, LDLCALC, TRIG, CHOLHDL, LDLDIRECT in the last 72 hours.  Thyroid Function Tests: No results for input(s): TSH, T4TOTAL, FREET4, T3FREE, THYROIDAB in the last 72 hours.  Anemia Panel: No results for input(s): VITAMINB12, FOLATE, FERRITIN, TIBC, IRON, RETICCTPCT in the last 72 hours.  Urine analysis: No results found for: COLORURINE, APPEARANCEUR, LABSPEC, PHURINE, GLUCOSEU, HGBUR, BILIRUBINUR, KETONESUR, PROTEINUR, UROBILINOGEN, NITRITE, LEUKOCYTESUR  Sepsis Labs: Lactic Acid, Venous No results found for: LATICACIDVEN  MICROBIOLOGY: Recent Results (from the past 240 hour(s))  SARS Coronavirus 2 [facility] order, Performed in [place] hospital lab)     Status: None   Collection Time: 07/04/19 12:50 AM  Result Value Ref Range Status   SARS Coronavirus 2 NEGATIVE NEGATIVE Final    Comment: (NOTE) If result is NEGATIVE SARS-CoV-2  target nucleic acids are NOT DETECTED. The SARS-CoV-2 RNA is generally detectable in upper and lower  respiratory specimens during the acute phase of infection. The lowest  concentration of SARS-CoV-2 viral copies this assay can detect is 250  copies / mL. A negative result does not preclude SARS-CoV-2 infection  and should not be used as the sole basis for treatment or other  patient management decisions.  A negative result may occur with  improper specimen collection / handling, submission of specimen other  than nasopharyngeal swab, presence of viral mutation(s) within the  areas targeted by this assay, and inadequate number of viral copies  (<250 copies / mL). A negative result must be combined with clinical  observations, patient history, and epidemiological information. If result is POSITIVE  SARS-CoV-2 target nucleic acids are DETECTED. The SARS-CoV-2 RNA is generally detectable in upper and lower  respiratory specimens dur ing the acute phase of infection.  Positive  results are indicative of active infection with SARS-CoV-2.  Clinical  correlation with patient history and other diagnostic information is  necessary to determine patient infection status.  Positive results do  not rule out bacterial infection or co-infection with other viruses. If result is PRESUMPTIVE POSTIVE SARS-CoV-2 nucleic acids MAY BE PRESENT.   A presumptive positive result was obtained on the submitted specimen  and confirmed on repeat testing.  While 2019 novel coronavirus  (SARS-CoV-2) nucleic acids may be present in the submitted sample  additional confirmatory testing may be necessary for epidemiological  and / or clinical management purposes  to differentiate between  SARS-CoV-2 and other Sarbecovirus currently known to infect humans.  If clinically indicated additional testing with an alternate test  methodology [phone]) is advised. The SARS-CoV-2 RNA is generally  detectable in upper and lower  respiratory sp ecimens during the acute  phase of infection. The expected result is Negative. Fact Sheet for Patients:  BoilerBrush.com.cy Fact Sheet for Healthcare Providers: https://pope.com/ This test is not yet approved or cleared by the [place] FDA and has been authorized for detection and/or diagnosis of SARS-CoV-2 by FDA under an Emergency Use Authorization (EUA).  This EUA will remain in effect (meaning this test can be used) for the duration of the COVID-19 declaration under Section 564(b)(1) of the Act, 21 U.S.C. section 360bbb-3(b)(1), unless the authorization is terminated or revoked sooner. Performed at [facility], [address], [place], [place] [address]   Surgical pcr screen     Status: None   Collection Time: 07/04/19  3:55 AM   Specimen: Nasal Mucosa; Nasal Swab  Result Value Ref Range Status   MRSA, PCR NEGATIVE NEGATIVE Final   Staphylococcus aureus NEGATIVE NEGATIVE Final    Comment: (NOTE) The Xpert SA Assay (FDA approved for NASAL specimens in patients 59 years of age and older), is one component of a comprehensive surveillance program. It is not intended to diagnose infection nor to guide or monitor treatment. Performed at [facility], [address], [place], [place] [address]     RADIOLOGY STUDIES/RESULTS: Pelvis Portable  Result Date: 07/04/2019 CLINICAL DATA:  ORIF proximal femur fracture EXAM: PORTABLE PELVIS 1-2 VIEWS COMPARISON:  Left hip radiographs from 1 day prior FINDINGS: Near-anatomic alignment of comminuted intertrochanteric left proximal femur fracture status post transfixation by intramedullary rod with interlocking left femoral neck pin and distal interlocking screw. Expected soft tissue gas surrounding the fracture site. No additional acute osseous fracture. No evidence of hip dislocation on this single frontal view. Healed deformities in the right superior and inferior  pubic rami. No suspicious focal osseous lesions. IMPRESSION: Near-anatomic alignment of comminuted intertrochanteric left proximal femur fracture status post ORIF. Electronically Signed   By: Delbert Phenix M.D.   On: 07/04/2019 14:46   Dg Chest Portable 1 View  Result Date: 07/03/2019 CLINICAL DATA:  Broken left hip, preop EXAM: PORTABLE CHEST 1 VIEW COMPARISON:  October 26, 2018 FINDINGS: There is mild cardiomegaly. The lungs are clear. No pneumothorax or pleural effusion. Healing posterior left third through sixth rib fractures are seen. There is also healing lateral right seventh and eighth rib fractures seen. Advanced bilateral shoulder arthropathy seen. Again noted is flattening and cortical irregularity of the left humeral head. S-shaped scoliotic curvature seen. IMPRESSION: No acute cardiopulmonary process. Healing  bilateral rib fractures Electronically Signed   By: Jonna ClarkBindu  Avutu M.D.   On: 07/03/2019 23:17   Dg Knee Left Port  Result Date: 07/03/2019 CLINICAL DATA:  Hip fracture, pain EXAM: PORTABLE LEFT KNEE - 1-2 VIEW COMPARISON:  None. FINDINGS: There is diffuse osteopenia. No definite fracture is seen. Linear calcifications seen in mediolateral joint space. No large knee joint effusion. 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 IMPRESSION: No acute osseous abnormality. Electronically Signed   By: Jonna ClarkBindu  Avutu M.D.   On: 07/03/2019 23:21   Dg Tibia/fibula Left Port  Result Date: 07/03/2019 CLINICAL DATA:  Pain, hip fracture EXAM: PORTABLE LEFT TIBIA AND FIBULA - 2 VIEW COMPARISON:  None. FINDINGS: No definite fracture or dislocation. No significant overlying soft tissue swelling. IMPRESSION: No acute osseous injury. Electronically Signed   By: Jonna ClarkBindu  Avutu M.D.   On: 07/03/2019 23:22   Dg C-arm 1-60 Min  Result Date: 07/04/2019 CLINICAL DATA:  Left trochanteric intramedullary nail. EXAM: OPERATIVE LEFT HIP (WITH PELVIS IF PERFORMED) 2 VIEWS TECHNIQUE: Fluoroscopic spot image(s) were submitted  for interpretation post-operatively. COMPARISON:  07/03/2019 FINDINGS: Examination demonstrates internal fixation with intramedullary nail and associated screw bridging patient's intertrochanteric fracture into the femoral head. Hardware is intact as there is near anatomic alignment about the fracture site. Remainder the exam is unchanged. IMPRESSION: Fixation of patient's known left intertrochanteric fracture with hardware intact and near anatomic alignment over the fracture site. Electronically Signed   By: Elberta Fortisaniel  Boyle M.D.   On: 07/04/2019 11:10   Dg Hip Port Unilat With Pelvis 1v Left  Result Date: 07/03/2019 CLINICAL DATA:  Hip fracture EXAM: DG HIP (WITH OR WITHOUT PELVIS) 1V PORT LEFT COMPARISON:  None. FINDINGS: There is a comminuted impacted fracture of the left femoral head neck junction which extends through the greater and lesser trochanters. There is rotation at the femoral head on the acetabulum, however it is still well seated within the acetabulum. There is healing right superior and inferior pubic rami fractures. There is cortical step-off seen at the left superior pubic rami, likely nondisplaced fracture. Degenerative changes in the lower lumbar spine. IMPRESSION: Comminuted impacted left femoral head neck fracture extending through the greater and lesser trochanters. Cortical step-off at the superior left pubic rami, likely nondisplaced fracture Healing right superior and inferior pubic rami fractures. Electronically Signed   By: Jonna ClarkBindu  Avutu M.D.   On: 07/03/2019 23:20   Dg Hip Operative Unilat W Or W/o Pelvis Left  Result Date: 07/04/2019 CLINICAL DATA:  Left trochanteric intramedullary nail. EXAM: OPERATIVE LEFT HIP (WITH PELVIS IF PERFORMED) 2 VIEWS TECHNIQUE: Fluoroscopic spot image(s) were submitted for interpretation post-operatively. COMPARISON:  07/03/2019 FINDINGS: Examination demonstrates internal fixation with intramedullary nail and associated screw bridging patient's  intertrochanteric fracture into the femoral head. Hardware is intact as there is near anatomic alignment about the fracture site. Remainder the exam is unchanged. IMPRESSION: Fixation of patient's known left intertrochanteric fracture with hardware intact and near anatomic alignment over the fracture site. Electronically Signed   By: Elberta Fortisaniel  Boyle M.D.   On: 07/04/2019 11:10     LOS: 2 days   Jeoffrey MassedShanker Gaylen Pereira, MD  [facility]  If 7PM-7AM, please contact night-coverage  Please page via [email or web address]  Go to [email or web address] and use [place]'s universal password to access. If you do not have the password, please contact the hospital operator.  Locate the [facility] provider you are looking for under [facility] and page to a number that you can be directly reached. If you still  have difficulty reaching the provider, please page the [facility] (Director on Call) for the Hospitalists listed on amion for assistance.  07/06/2019, 1:07 PM

## 2019-07-06 NOTE — NC FL2 (Signed)
[place] LEVEL OF CARE SCREENING TOOL     IDENTIFICATION  Patient Name: Brenda Reynolds Birthdate: 1930/09/20 Sex: female Admission Date (Current Location): 07/03/2019  [facility] and [place] Number:  [employer] and Address:  [facility], [place] [address], [place], [place] [address]      Provider Number: [id number]  Attending Physician Name and Address:  Jonetta Osgood, MD  Relative Name and Phone Number:  Eugenie Filler - Friend - [phone]    Current Level of Care: Hospital Recommended Level of Care: [place] Prior Approval Number:    Date Approved/Denied:   PASRR Number: [id number](Eff. 07/06/19)  Discharge Plan: SNF    Current Diagnoses: Patient Active Problem List   Diagnosis Date Noted  . Intertrochanteric fracture of left femur ([place]) 07/04/2019  . Hyponatremia 07/04/2019  . Hypertension 07/04/2019  . Osteoporosis 07/04/2019  . GERD (gastroesophageal reflux disease) 07/04/2019    Orientation RESPIRATION BLADDER Height & Weight     Self, Time, Situation, Place  Normal Continent Weight: 127 lb 12.8 oz (58 kg) Height:  5' (152.4 cm)  BEHAVIORAL SYMPTOMS/MOOD NEUROLOGICAL BOWEL NUTRITION STATUS      Continent Diet(Regular diet)  AMBULATORY STATUS COMMUNICATION OF NEEDS Skin   Total Care(Patient has a left femur fracture) Verbally Surgical wounds(Surgery 10/4 and closed incision left leg with dressing and aquacel due to left femur fracture.)                       Personal Care Assistance Level of Assistance  Bathing, Feeding, Dressing Bathing Assistance: Limited assistance Feeding assistance: Independent(Assistance with set-up) Dressing Assistance: Limited assistance     Functional Limitations Info  Sight, Hearing, Speech Sight Info: Adequate Hearing Info: Adequate Speech Info: Adequate    SPECIAL CARE FACTORS FREQUENCY  PT (By licensed PT), OT (By licensed OT)     PT  Frequency: PT evaluated at hospital 10/5. PT at [facility] Eval and Treat a minimum of 5 days per week OT Frequency: OT eval pending as of 10/6. OT at SNF Eval and Treat a minimum of 5 days per week            Contractures Contractures Info: Not present    Additional Factors Info  Code Status, Allergies Code Status Info: Full Allergies Info: Lidocaine           Current Medications (07/06/2019):  This is the current hospital active medication list Current Facility-Administered Medications  Medication Dose Route Frequency Provider Last Rate Last Dose  . acetaminophen (TYLENOL) tablet 325-650 mg  325-650 mg Oral Q6H PRN Arby Barrette A, NP   650 mg at 07/06/19 1143  . docusate sodium (COLACE) capsule 100 mg  100 mg Oral BID Rod Can, MD   100 mg at 07/06/19 0931  . enoxaparin (LOVENOX) injection 30 mg  30 mg Subcutaneous Q24H Arrien, Jimmy Picket, MD   30 mg at 07/06/19 0931  . famotidine (PEPCID) tablet 20 mg  20 mg Oral Daily Rod Can, MD   20 mg at 07/06/19 0931  . folic acid (FOLVITE) tablet 0.5 mg  0.5 mg Oral Daily Swinteck, Aaron Edelman, MD   0.5 mg at 07/06/19 0931  . gabapentin (NEURONTIN) capsule 300 mg  300 mg Oral BID Tawni Millers, MD   300 mg at 07/06/19 0931  . HYDROcodone-acetaminophen (NORCO) 7.5-325 MG per tablet 1-2 tablet  1-2 tablet Oral Q4H PRN Rod Can, MD   2 tablet at  07/06/19 0124  . HYDROcodone-acetaminophen (NORCO/VICODIN) 5-325 MG per tablet 1-2 tablet  1-2 tablet Oral Q4H PRN Swinteck, Arlys John, MD      . HYDROmorphone (DILAUDID) injection 0.5 mg  0.5 mg Intravenous Q4H PRN Samson Frederic, MD   0.5 mg at 07/04/19 0040  . losartan (COZAAR) tablet 100 mg  100 mg Oral Daily Samson Frederic, MD   100 mg at 07/06/19 0932  . menthol-cetylpyridinium (CEPACOL) lozenge 3 mg  1 lozenge Oral PRN Swinteck, Arlys John, MD       Or  . phenol (CHLORASEPTIC) mouth spray 1 spray  1 spray Mouth/Throat PRN Swinteck, Arlys John, MD      . metoCLOPramide  (REGLAN) tablet 5-10 mg  5-10 mg Oral Q8H PRN Swinteck, Arlys John, MD       Or  . metoCLOPramide (REGLAN) injection 5-10 mg  5-10 mg Intravenous Q8H PRN Swinteck, Arlys John, MD      . ondansetron (ZOFRAN) tablet 4 mg  4 mg Oral Q6H PRN Swinteck, Arlys John, MD       Or  . ondansetron (ZOFRAN) injection 4 mg  4 mg Intravenous Q6H PRN Swinteck, Arlys John, MD      . senna (SENOKOT) tablet 8.6 mg  1 tablet Oral BID Samson Frederic, MD   8.6 mg at 07/06/19 0931  . sodium chloride flush (NS) 0.9 % injection 3 mL  3 mL Intravenous Q12H Pearson Grippe, MD   3 mL at 07/06/19 0932  . vitamin B-12 (CYANOCOBALAMIN) tablet 500 mcg  500 mcg Oral Daily Samson Frederic, MD   500 mcg at 07/05/19 1351     Discharge Medications: Please see discharge summary for a list of discharge medications.  Relevant Imaging Results:  Relevant Lab Results:   Additional Information ss#[id number].  Cristobal Goldmann, LCSW

## 2019-07-06 NOTE — Progress Notes (Signed)
Physical Therapy Treatment Patient Details Name: Brenda Reynolds MRN: [id number] DOB: June 06, 1930 Today's Date: 07/06/2019    History of Present Illness 85 yo admitted after fall at home with left femur fx s/p IM nail. PMhx: HTN, GERD, osteoporosis    PT Comments    Pt pleasant, talkative and reports groin pain overnight but only mild soreness this am. Pt with improved gait tolerance but limited by back pain and fatigue as well as decreased balance and safety awareness. Pt educated for HEP and progression and will continue to follow acutely.     Follow Up Recommendations  SNF;Supervision/Assistance - 24 hour     Equipment Recommendations  Rolling walker with 5" wheels;3in1 (PT)    Recommendations for Other Services       Precautions / Restrictions Precautions Precautions: Fall Restrictions Weight Bearing Restrictions: Yes LLE Weight Bearing: Weight bearing as tolerated    Mobility  Bed Mobility Overal bed mobility: Needs Assistance Bed Mobility: Supine to Sit     Supine to sit: Mod assist     General bed mobility comments: mod assist to elevate trunk and scoot to EOB today with increased time and cues  Transfers Overall transfer level: Needs assistance   Transfers: Sit to/from Stand Sit to Stand: Min assist         General transfer comment: min assist to stand from bed and BSC with use of RW to pivot to [facility], cues for safety and hand placement  Ambulation/Gait Ambulation/Gait assistance: Min assist Gait Distance (Feet): 80 Feet Assistive device: Rolling walker (2 wheeled) Gait Pattern/deviations: Step-through pattern;Decreased stride length;Trunk flexed   Gait velocity interpretation: 1.31 - 2.62 ft/sec, indicative of limited community ambulator General Gait Details: pt with antalgic gt but reporting back pain with gait and pt propping on right forearm on RW several times during gait. Pt requires cues to attend to fatigue level   Stairs              Wheelchair Mobility    Modified Rankin (Stroke Patients Only)       Balance Overall balance assessment: Needs assistance;History of Falls Sitting-balance support: No upper extremity supported;Feet supported Sitting balance-Leahy Scale: Fair Sitting balance - Comments: EOB with supervision     Standing balance-Leahy Scale: Poor Standing balance comment: min assist at sink for static standing with tendency for posterior lean, RW for gt                            Cognition Arousal/Alertness: Awake/alert Behavior During Therapy: WFL for tasks assessed/performed Overall Cognitive Status: Impaired/Different from baseline Area of Impairment: Safety/judgement;Memory;Attention                   Current Attention Level: Sustained Memory: Decreased short-term memory   Safety/Judgement: Decreased awareness of deficits;Decreased awareness of safety     General Comments: pt very talkative and easily distracted from task then discussing some random images popping up on her phone      Exercises General Exercises - Lower Extremity Long Arc Quad: AROM;AAROM;Right;Left;Seated;15 reps(AArOM on LEft) Hip Flexion/Marching: AROM;AAROM;Right;Left;Seated;15 reps(AAROM on LLE)    General Comments        Pertinent Vitals/Pain Pain Score: 2  Pain Location: groin Pain Descriptors / Indicators: Aching;Sore Pain Intervention(s): Limited activity within patient's tolerance;Repositioned;Monitored during session    Home Living                      Prior Function  PT Goals (current goals can now be found in the care plan section) Progress towards PT goals: Progressing toward goals    Frequency           PT Plan Current plan remains appropriate    Co-evaluation              AM-PAC PT "6 Clicks" Mobility   Outcome Measure  Help needed turning from your back to your side while in a flat bed without using bedrails?: A Little Help  needed moving from lying on your back to sitting on the side of a flat bed without using bedrails?: A Lot Help needed moving to and from a bed to a chair (including a wheelchair)?: A Little Help needed standing up from a chair using your arms (e.g., wheelchair or bedside chair)?: A Little Help needed to walk in hospital room?: A Little Help needed climbing 3-5 steps with a railing? : A Lot 6 Click Score: 16    End of Session Equipment Utilized During Treatment: Gait belt Activity Tolerance: Patient tolerated treatment well Patient left: in chair;with call bell/phone within reach;with chair alarm set Nurse Communication: Mobility status;Weight bearing status PT Visit Diagnosis: Other abnormalities of gait and mobility (R26.89);Muscle weakness (generalized) (M62.81);History of falling (Z91.81);Unsteadiness on feet (R26.81)     Time: 8921-1941 PT Time Calculation (min) (ACUTE ONLY): 28 min  Charges:  $Gait Training: 8-22 mins $Therapeutic Exercise: 8-22 mins                     Quanika Solem Abner Greenspan, PT Acute Rehabilitation Services Pager: [phone] Office: [phone]    Enedina Finner Rhonin Trott 07/06/2019, 11:21 AM

## 2019-07-06 NOTE — Evaluation (Signed)
Occupational Therapy Evaluation Patient Details Name: Brenda Reynolds MRN: [id number] DOB: 1930/05/01 Today's Date: 07/06/2019    History of Present Illness 85 yo admitted after fall at home with left femur fx s/p IM nail. PMhx: HTN, GERD, osteoporosis   Clinical Impression   Pt was functioning modified independently prior to admission. Presents with decreased standing balance, impaired safety awareness and arthritic limitations in her hands and L shoulder. Pt requires min to mod assist for mobility and set up to max assist for ADL. She lives alone and will need post acute rehab in SNF prior to going back home.    Follow Up Recommendations  SNF;Supervision/Assistance - 24 hour    Equipment Recommendations  Other (comment)(defer to next venue)    Recommendations for Other Services       Precautions / Restrictions Precautions Precautions: Fall Restrictions Weight Bearing Restrictions: Yes LLE Weight Bearing: Weight bearing as tolerated      Mobility Bed Mobility Overal bed mobility: Needs Assistance Bed Mobility: Sit to Supine       Sit to supine: Mod assist   General bed mobility comments: assist for LEs back into bed  Transfers Overall transfer level: Needs assistance Equipment used: Rolling walker (2 wheeled) Transfers: Sit to/from [employer] Sit to Stand: Mod assist Stand pivot transfers: Min assist       General transfer comment: mod assist to rise and steady, cues for hand placement , decreased control of descent    Balance Overall balance assessment: Needs assistance;History of Falls Sitting-balance support: No upper extremity supported;Feet supported Sitting balance-Leahy Scale: Fair       Standing balance-Leahy Scale: Poor Standing balance comment: requires at least one hand support while performing pericare                           ADL either performed or assessed with clinical judgement   ADL Overall ADL's : Needs  assistance/impaired Eating/Feeding: Independent;Sitting   Grooming: Set up;Wash/dry hands;Sitting   Upper Body Bathing: Set up;Sitting   Lower Body Bathing: Maximal assistance;Sit to/from stand   Upper Body Dressing : Set up;Sitting   Lower Body Dressing: Maximal assistance;Sit to/from stand   Toilet Transfer: Minimal assistance;Stand-pivot;RW;BSC   Toileting- Architect and Hygiene: Min guard;Sit to/from stand       Functional mobility during ADLs: Minimal assistance;Rolling walker       Vision Baseline Vision/History: Wears glasses Wears Glasses: At all times Patient Visual Report: No change from baseline       Perception     Praxis      Pertinent Vitals/Pain Pain Assessment: Faces Faces Pain Scale: Hurts little more Pain Location: L LE Pain Descriptors / Indicators: Sore Pain Intervention(s): Monitored during session;Repositioned     Hand Dominance Right   Extremity/Trunk Assessment Upper Extremity Assessment Upper Extremity Assessment: RUE deficits/detail;LUE deficits/detail RUE Deficits / Details: arthritic changes in hand RUE Coordination: decreased fine motor LUE Deficits / Details: arthritic changes in hand, longstanding shoulder ROM limitations from prior fx LUE Coordination: decreased fine motor;decreased gross motor   Lower Extremity Assessment Lower Extremity Assessment: Defer to PT evaluation   Cervical / Trunk Assessment Cervical / Trunk Assessment: Kyphotic   Communication Communication Communication: HOH   Cognition Arousal/Alertness: Awake/alert Behavior During Therapy: WFL for tasks assessed/performed Overall Cognitive Status: Impaired/Different from baseline Area of Impairment: Safety/judgement;Memory;Attention                   Current Attention  Level: Sustained Memory: Decreased short-term memory   Safety/Judgement: Decreased awareness of deficits;Decreased awareness of safety     General Comments: pt  talkative and distractable   General Comments       Exercises     Shoulder Instructions      Home Living Family/patient expects to be discharged to:: Private residence Living Arrangements: Alone Available Help at Discharge: Friend(s);Available PRN/intermittently Type of Home: Apartment Home Access: Stairs to enter Entrance Stairs-Number of Steps: 2 flights   Home Layout: One level     Bathroom Shower/Tub: Teacher, early years/pre: Standard     Home Equipment: Environmental consultant - 2 wheels;Cane - single point          Prior Functioning/Environment Level of Independence: Independent with assistive device(s)        Comments: uses DME on occasion, has assist for grocery and doctors, doesn't drive        OT Problem List: Decreased activity tolerance;Impaired balance (sitting and/or standing);Decreased knowledge of use of DME or AE;Pain;Decreased cognition;Decreased safety awareness;Decreased strength      OT Treatment/Interventions: Self-care/ADL training;DME and/or AE instruction;Patient/family education;Balance training;Therapeutic activities    OT Goals(Current goals can be found in the care plan section) Acute Rehab OT Goals Patient Stated Goal: return home OT Goal Formulation: With patient Time For Goal Achievement: 07/20/19 Potential to Achieve Goals: Good ADL Goals Pt Will Perform Grooming: with supervision;standing Pt Will Perform Lower Body Bathing: with supervision;with adaptive equipment;sit to/from stand Pt Will Perform Lower Body Dressing: with supervision;with adaptive equipment;sit to/from stand Pt Will Transfer to Toilet: with supervision;ambulating;bedside commode Pt Will Perform Toileting - Clothing Manipulation and hygiene: with supervision;sit to/from stand  OT Frequency: Min 2X/week   Barriers to D/C: Decreased caregiver support          Co-evaluation              AM-PAC OT "6 Clicks" Daily Activity     Outcome Measure Help from  another person eating meals?: None Help from another person taking care of personal grooming?: A Little Help from another person toileting, which includes using toliet, bedpan, or urinal?: A Lot Help from another person bathing (including washing, rinsing, drying)?: A Lot Help from another person to put on and taking off regular upper body clothing?: None Help from another person to put on and taking off regular lower body clothing?: A Lot 6 Click Score: 17   End of Session Equipment Utilized During Treatment: Gait belt;Rolling walker  Activity Tolerance: Patient tolerated treatment well Patient left: in bed;with call bell/phone within reach;with bed alarm set  OT Visit Diagnosis: Unsteadiness on feet (R26.81);Other abnormalities of gait and mobility (R26.89);Pain;Muscle weakness (generalized) (M62.81);Other symptoms and signs involving cognitive function                Time: 0623-7628 OT Time Calculation (min): 34 min Charges:  OT General Charges $OT Visit: 1 Visit OT Evaluation $OT Eval Moderate Complexity: 1 Mod OT Treatments $Self Care/Home Management : 8-22 mins  Nestor Lewandowsky, OTR/L Acute Rehabilitation Services Pager: [phone] Office: [phone]  Malka So 07/06/2019, 3:58 PM

## 2019-07-07 DIAGNOSIS — S72002A Fracture of unspecified part of neck of left femur, initial encounter for closed fracture: Secondary | ICD-10-CM

## 2019-07-07 LAB — CBC
HCT: 29.8 % — ABNORMAL LOW (ref 36.0–46.0)
Hemoglobin: 9.6 g/dL — ABNORMAL LOW (ref 12.0–15.0)
MCH: 31.9 pg (ref 26.0–34.0)
MCHC: 32.2 g/dL (ref 30.0–36.0)
MCV: 99 fL (ref 80.0–100.0)
Platelets: 168 10*3/uL (ref 150–400)
RBC: 3.01 MIL/uL — ABNORMAL LOW (ref 3.87–5.11)
RDW: 14.6 % (ref 11.5–15.5)
WBC: 10.2 10*3/uL (ref 4.0–10.5)
nRBC: 0.2 % (ref 0.0–0.2)

## 2019-07-07 NOTE — Progress Notes (Signed)
[facility] PROGRESS NOTE    Progress Note  Brenda Reynolds  ZHY:[id number] DOB: 1930-04-20 DOA: 07/03/2019 PCP: Toma DeitersHasanaj, Xaje A, MD     Brief Narrative:   Brenda Reynolds is an 85 y.o. female past medical history of essential hypertension GERD osteoporosis presents with left hip pain following a mechanical fall.  Assessment/Plan:   Intertrochanteric fracture of left femur (HCC) Status post intramedullary fixation on 07/04/2019. Anticoagulation per orthopedic surgery.  Weightbearing as tolerated. Physical therapy evaluated the patient the recommended skilled nursing facility awaiting social worker placement.  Normocytic anemia: Perooperative blood loss continue check hemoglobin intermittently.    Essential hypertension: Continue losartan blood pressure fairly stable.  Hypovolemic hyponatremia Resolved with IV fluid hydration.   DVT prophylaxis: lovenox Family Communication:none Disposition Plan/Barrier to D/C: SNF in am Code Status:     Code Status Orders  (From admission, onward)         Start     Ordered   07/04/19 0022  Full code  Continuous     07/04/19 0027        Code Status History    This patient has a current code status but no historical code status.   Advance Care Planning Activity        IV Access:    Peripheral IV   Procedures and diagnostic studies:   No results found.   Medical Consultants:    None.  Anti-Infectives:   None  Subjective:    Hanna B Sabino relates she feels fairly.  Objective:    Vitals:   07/06/19 1422 07/06/19 1931 07/07/19 0314 07/07/19 0813  BP: 110/61 (!) 106/58 (!) 148/83 (!) 155/90  Pulse: 96 (!) 106 (!) 105 (!) 104  Resp: 16 16 15 16   Temp: 98 F (36.7 C) 98.9 F (37.2 C) 98.4 F (36.9 C) 98.4 F (36.9 C)  TempSrc: Oral Oral Oral Oral  SpO2: 92%  91% (!) 75%  Weight:      Height:       SpO2: (!) 75 %   Intake/Output Summary (Last 24 hours) at 07/07/2019 1244 Last data filed  at 07/07/2019 0900 Gross per 24 hour  Intake 480 ml  Output -  Net 480 ml   Filed Weights   07/03/19 2155 07/04/19 0304  Weight: 58.5 kg 58 kg    Exam: General exam: In no acute distress. Respiratory system: Good air movement and clear to auscultation. Cardiovascular system: S1 & S2 heard, RRR. No JVD. Gastrointestinal system: Abdomen is nondistended, soft and nontender.  Central nervous system: Alert and oriented. No focal neurological deficits. Extremities: No pedal edema. Skin: No rashes, lesions or ulcers Psychiatry: Judgement and insight appear normal. Mood & affect appropriate.    Data Reviewed:    Labs: Basic Metabolic Panel: Recent Labs  Lab 07/03/19 2304 07/04/19 0525 07/04/19 1642 07/05/19 0456 07/06/19 0429  NA 130* 133*  --  134* 136  K 4.6 4.2  --  4.2 4.2  CL 95* 99  --  102 104  CO2 24 23  --  23 23  GLUCOSE 126* 158*  --  132* 93  BUN 11 13  --  12 12  CREATININE 1.09* 1.14* 1.22* 1.06* 0.88  CALCIUM 9.3 9.3  --  8.8* 8.6*   GFR Estimated Creatinine Clearance: 36.6 mL/min (by C-G formula based on SCr of 0.88 mg/dL). Liver Function Tests: Recent Labs  Lab 07/03/19 2304 07/04/19 0525  AST 26 23  ALT 12 14  ALKPHOS 45  46  BILITOT 1.9* 1.2  PROT 6.0* 5.6*  ALBUMIN 3.6 3.4*   No results for input(s): LIPASE, AMYLASE in the last 168 hours. No results for input(s): AMMONIA in the last 168 hours. Coagulation profile Recent Labs  Lab 07/03/19 2304  INR 1.0   COVID-19 Labs  No results for input(s): DDIMER, FERRITIN, LDH, CRP in the last 72 hours.  Lab Results  Component Value Date   [place] NEGATIVE 07/06/2019   [place] NEGATIVE 07/04/2019    CBC: Recent Labs  Lab 07/03/19 2304 07/04/19 0525 07/04/19 1642 07/05/19 0456 07/06/19 0429 07/07/19 0253  WBC 10.3 9.0 10.0 10.7* 11.0* 10.2  NEUTROABS 8.7*  --   --  9.3*  --   --   HGB 12.8 12.0 10.0* 8.3* 8.1* 9.6*  HCT 37.8 36.5 29.7* 24.3* 24.9* 29.8*  MCV 95.9 96.3 96.1  94.9 97.3 99.0  PLT 177 163 139* 119* 123* 168   Cardiac Enzymes: No results for input(s): CKTOTAL, CKMB, CKMBINDEX, TROPONINI in the last 168 hours. BNP (last 3 results) No results for input(s): PROBNP in the last 8760 hours. CBG: No results for input(s): GLUCAP in the last 168 hours. D-Dimer: No results for input(s): DDIMER in the last 72 hours. Hgb A1c: No results for input(s): HGBA1C in the last 72 hours. Lipid Profile: No results for input(s): CHOL, HDL, LDLCALC, TRIG, CHOLHDL, LDLDIRECT in the last 72 hours. Thyroid function studies: No results for input(s): TSH, T4TOTAL, T3FREE, THYROIDAB in the last 72 hours.  Invalid input(s): FREET3 Anemia work up: No results for input(s): VITAMINB12, FOLATE, FERRITIN, TIBC, IRON, RETICCTPCT in the last 72 hours. Sepsis Labs: Recent Labs  Lab 07/04/19 1642 07/05/19 0456 07/06/19 0429 07/07/19 0253  WBC 10.0 10.7* 11.0* 10.2   Microbiology Recent Results (from the past 240 hour(s))  SARS Coronavirus 2 [facility] order, Performed in [place] hospital lab)     Status: None   Collection Time: 07/04/19 12:50 AM  Result Value Ref Range Status   SARS Coronavirus 2 NEGATIVE NEGATIVE Final    Comment: (NOTE) If result is NEGATIVE SARS-CoV-2 target nucleic acids are NOT DETECTED. The SARS-CoV-2 RNA is generally detectable in upper and lower  respiratory specimens during the acute phase of infection. The lowest  concentration of SARS-CoV-2 viral copies this assay can detect is 250  copies / mL. A negative result does not preclude SARS-CoV-2 infection  and should not be used as the sole basis for treatment or other  patient management decisions.  A negative result may occur with  improper specimen collection / handling, submission of specimen other  than nasopharyngeal swab, presence of viral mutation(s) within the  areas targeted by this assay, and inadequate number of viral copies  (<250 copies / mL). A negative result must be  combined with clinical  observations, patient history, and epidemiological information. If result is POSITIVE SARS-CoV-2 target nucleic acids are DETECTED. The SARS-CoV-2 RNA is generally detectable in upper and lower  respiratory specimens dur ing the acute phase of infection.  Positive  results are indicative of active infection with SARS-CoV-2.  Clinical  correlation with patient history and other diagnostic information is  necessary to determine patient infection status.  Positive results do  not rule out bacterial infection or co-infection with other viruses. If result is PRESUMPTIVE POSTIVE SARS-CoV-2 nucleic acids MAY BE PRESENT.   A presumptive positive result was obtained on the submitted specimen  and confirmed on repeat testing.  While 2019 novel coronavirus  (SARS-CoV-2) nucleic acids  may be present in the submitted sample  additional confirmatory testing may be necessary for epidemiological  and / or clinical management purposes  to differentiate between  SARS-CoV-2 and other Sarbecovirus currently known to infect humans.  If clinically indicated additional testing with an alternate test  methodology [phone]) is advised. The SARS-CoV-2 RNA is generally  detectable in upper and lower respiratory sp ecimens during the acute  phase of infection. The expected result is Negative. Fact Sheet for Patients:  BoilerBrush.com.cy Fact Sheet for Healthcare Providers: https://pope.com/ This test is not yet approved or cleared by the [place] FDA and has been authorized for detection and/or diagnosis of SARS-CoV-2 by FDA under an Emergency Use Authorization (EUA).  This EUA will remain in effect (meaning this test can be used) for the duration of the COVID-19 declaration under Section 564(b)(1) of the Act, 21 U.S.C. section 360bbb-3(b)(1), unless the authorization is terminated or revoked sooner. Performed at [facility]  Lab, [address], [place], [place] [address]   Surgical pcr screen     Status: None   Collection Time: 07/04/19  3:55 AM   Specimen: Nasal Mucosa; Nasal Swab  Result Value Ref Range Status   MRSA, PCR NEGATIVE NEGATIVE Final   Staphylococcus aureus NEGATIVE NEGATIVE Final    Comment: (NOTE) The Xpert SA Assay (FDA approved for NASAL specimens in patients 109 years of age and older), is one component of a comprehensive surveillance program. It is not intended to diagnose infection nor to guide or monitor treatment. Performed at [facility] Lab, [address], [place], [place] [address]   SARS CORONAVIRUS 2 (TAT 6-24 HRS) Nasopharyngeal Nasopharyngeal Swab     Status: None   Collection Time: 07/06/19  4:44 PM   Specimen: Nasopharyngeal Swab  Result Value Ref Range Status   SARS Coronavirus 2 NEGATIVE NEGATIVE Final    Comment: (NOTE) SARS-CoV-2 target nucleic acids are NOT DETECTED. The SARS-CoV-2 RNA is generally detectable in upper and lower respiratory specimens during the acute phase of infection. Negative results do not preclude SARS-CoV-2 infection, do not rule out co-infections with other pathogens, and should not be used as the sole basis for treatment or other patient management decisions. Negative results must be combined with clinical observations, patient history, and epidemiological information. The expected result is Negative. Fact Sheet for Patients: HairSlick.no Fact Sheet for Healthcare Providers: quierodirigir.com This test is not yet approved or cleared by the [place] FDA and  has been authorized for detection and/or diagnosis of SARS-CoV-2 by FDA under an Emergency Use Authorization (EUA). This EUA will remain  in effect (meaning this test can be used) for the duration of the COVID-19 declaration under Section 56 4(b)(1) of the Act, 21 U.S.C. section 360bbb-3(b)(1), unless the authorization is  terminated or revoked sooner. Performed at [facility], [address], [place], [place] [address]      Medications:   . docusate sodium  100 mg Oral BID  . enoxaparin (LOVENOX) injection  40 mg Subcutaneous Q24H  . famotidine  20 mg Oral Daily  . folic acid  0.5 mg Oral Daily  . gabapentin  300 mg Oral BID  . losartan  25 mg Oral Daily  . senna  1 tablet Oral BID  . sodium chloride flush  3 mL Intravenous Q12H  . vitamin B-12  500 mcg Oral Daily   Continuous Infusions:    LOS: 3 days   Marinda Elk  [facility]  07/07/2019,  12:44 PM

## 2019-07-07 NOTE — Care Management Important Message (Signed)
Important Message  Patient Details  Name: Brenda Reynolds MRN: [id number] Date of Birth: [birth year]   Medicare Important Message Given:  Yes     Memory Argue 07/07/2019, 1:10 PM    IM HAS BEEN SIGNED BY PATIENT

## 2019-07-07 NOTE — Plan of Care (Signed)
  Problem: Safety: Goal: Ability to remain free from injury will improve Outcome: Progressing   

## 2019-07-08 DIAGNOSIS — Z743 Need for continuous supervision: Secondary | ICD-10-CM | POA: Diagnosis not present

## 2019-07-08 DIAGNOSIS — S72142D Displaced intertrochanteric fracture of left femur, subsequent encounter for closed fracture with routine healing: Secondary | ICD-10-CM | POA: Diagnosis not present

## 2019-07-08 DIAGNOSIS — M84452A Pathological fracture, left femur, initial encounter for fracture: Secondary | ICD-10-CM | POA: Diagnosis not present

## 2019-07-08 DIAGNOSIS — I10 Essential (primary) hypertension: Secondary | ICD-10-CM | POA: Diagnosis not present

## 2019-07-08 DIAGNOSIS — S72145A Nondisplaced intertrochanteric fracture of left femur, initial encounter for closed fracture: Secondary | ICD-10-CM | POA: Diagnosis not present

## 2019-07-08 DIAGNOSIS — R2689 Other abnormalities of gait and mobility: Secondary | ICD-10-CM | POA: Diagnosis not present

## 2019-07-08 DIAGNOSIS — S79929A Unspecified injury of unspecified thigh, initial encounter: Secondary | ICD-10-CM | POA: Diagnosis not present

## 2019-07-08 DIAGNOSIS — M8000XA Age-related osteoporosis with current pathological fracture, unspecified site, initial encounter for fracture: Secondary | ICD-10-CM | POA: Diagnosis not present

## 2019-07-08 DIAGNOSIS — R41841 Cognitive communication deficit: Secondary | ICD-10-CM | POA: Diagnosis not present

## 2019-07-08 DIAGNOSIS — R279 Unspecified lack of coordination: Secondary | ICD-10-CM | POA: Diagnosis not present

## 2019-07-08 DIAGNOSIS — S72002A Fracture of unspecified part of neck of left femur, initial encounter for closed fracture: Secondary | ICD-10-CM | POA: Diagnosis not present

## 2019-07-08 DIAGNOSIS — M810 Age-related osteoporosis without current pathological fracture: Secondary | ICD-10-CM | POA: Diagnosis not present

## 2019-07-08 DIAGNOSIS — Z9181 History of falling: Secondary | ICD-10-CM | POA: Diagnosis not present

## 2019-07-08 DIAGNOSIS — S72032D Displaced midcervical fracture of left femur, subsequent encounter for closed fracture with routine healing: Secondary | ICD-10-CM | POA: Diagnosis not present

## 2019-07-08 DIAGNOSIS — E871 Hypo-osmolality and hyponatremia: Secondary | ICD-10-CM | POA: Diagnosis not present

## 2019-07-08 MED ORDER — HYDROCODONE-ACETAMINOPHEN 5-325 MG PO TABS: 1.0000 | ORAL_TABLET | ORAL | 0 refills | Status: DC | PRN

## 2019-07-08 MED ORDER — ENOXAPARIN SODIUM 40 MG/0.4ML ~~LOC~~ SOLN: 40.0000 mg | SUBCUTANEOUS | 0 refills | Status: DC

## 2019-07-08 NOTE — TOC Transition Note (Addendum)
Transition of Care (TOC) - CM/SW Discharge Note *Discharged to [facility] - 2 PM transport scheduled  *Tia Alert - Number for Report [phone].   Patient Details  Name: Brenda Reynolds MRN: [id number] Date of Birth: Jul 01, 1930  Transition of Care [facility]) CM/SW Contact:  Sable Feil, LCSW Phone Number: 07/08/2019, 11:39 AM   Clinical Narrative: CSW advised by admissions director at SNF that [facility] rec'd. Patient was advised during room visit that she would be discharging today to SNF, transported by [facility]. Patient was on the phone with her friend Beatrice's step-son and informed him of her discharge. Nurse provided with information to call report and [facility] contacted and transport scheduled for 2 pm.    Final next level of care: Skilled Nursing Facility([facility]) Barriers to Discharge: Barriers Resolved(Insurance auth rec'd 10/7 and patient ready for d/c 10/8)   Patient Goals and CMS Choice Patient states their goals for this hospitalization and ongoing recovery are:: Patient wants rehab to get stronger and then return home CMS Medicare.gov Compare Post Acute Care list provided to:: Patient Choice offered to / list presented to : Patient  Discharge Placement - [place] number recieved: 07/06/19([phone])              Patient to be transferred to facility by: Ambulance(2 pm pick-up scheduled) Name of family member notified: Attempted to reach friend-[name] by phone-no answer and no VM. Patient was on the phone during CSW's visit with Ms. [name] step-son. Patient informed him of d/c and he will contact Ms. Duard Larsen. Patient and family notified of of transfer: 07/08/19  Discharge Plan and [place] facility placement for ST rehab and nursing services                                 Social Determinants of Health (SDOH) Interventions  No  SDOH interventions needed at this time   Readmission Risk Interventions No flowsheet data found.

## 2019-07-08 NOTE — Progress Notes (Signed)
Report called to SNF, no questions at this time

## 2019-07-08 NOTE — Progress Notes (Signed)
Physical Therapy Treatment Patient Details Name: Brenda Reynolds MRN: [id number] DOB: 09-21-1930 Today's Date: 07/08/2019    History of Present Illness 85 yo admitted after fall at home with left femur fx s/p IM nail. PMhx: HTN, GERD, osteoporosis    PT Comments    Pt  Pleasant reporting having slept well. Pt with continued assist required for all transfers and gait with pt with flexed trunk and decreased tolerance limited by back and leg pain. Pt with continued decreased safety awareness and needs supervision and assist for all mobility. Educated for HEP and need for [facility] use during the day. Will continue to follow.    Follow Up Recommendations  SNF;Supervision/Assistance - 24 hour     Equipment Recommendations  Rolling walker with 5" wheels;3in1 (PT)    Recommendations for Other Services       Precautions / Restrictions Precautions Precautions: Fall Restrictions Weight Bearing Restrictions: Yes LLE Weight Bearing: Weight bearing as tolerated    Mobility  Bed Mobility Overal bed mobility: Needs Assistance Bed Mobility: Supine to Sit     Supine to sit: Mod assist     General bed mobility comments: assist to bring legs off of bed and to scoot to EOB with increased time and effort  Transfers Overall transfer level: Needs assistance   Transfers: Sit to/from Stand;Stand Pivot Transfers Sit to Stand: Min assist;Mod assist Stand pivot transfers: Min assist       General transfer comment: mod assist to rise and steady from bed with use of RW to pivot to [facility]. Min assist to stand from [facility], cues for hand placement , decreased control of descent  Ambulation/Gait Ambulation/Gait assistance: Min assist Gait Distance (Feet): 75 Feet Assistive device: Rolling walker (2 wheeled) Gait Pattern/deviations: Step-through pattern;Decreased stride length;Trunk flexed   Gait velocity interpretation: 1.31 - 2.62 ft/sec, indicative of limited community ambulator General Gait Details:  pt with antalgic gt but reporting back pain with gait and pt propping on right forearm on RW several times during gait. Pt requires cues to attend to fatigue level   Stairs             Wheelchair Mobility    Modified Rankin (Stroke Patients Only)       Balance Overall balance assessment: Needs assistance;History of Falls Sitting-balance support: No upper extremity supported;Feet supported Sitting balance-Leahy Scale: Fair       Standing balance-Leahy Scale: Poor Standing balance comment: requires at least one hand support while performing pericare                            Cognition Arousal/Alertness: Awake/alert Behavior During Therapy: WFL for tasks assessed/performed Overall Cognitive Status: Impaired/Different from baseline Area of Impairment: Safety/judgement;Memory;Attention                   Current Attention Level: Sustained Memory: Decreased short-term memory   Safety/Judgement: Decreased awareness of deficits;Decreased awareness of safety     General Comments: pt talkative and distractable      Exercises General Exercises - Lower Extremity Long Arc Quad: AROM;AAROM;Right;Left;Seated;15 reps(AAROM on LLE) Hip Flexion/Marching: AROM;AAROM;Right;Left;Seated;15 reps    General Comments        Pertinent Vitals/Pain Pain Score: 3  Pain Location: L LE Pain Descriptors / Indicators: Sore Pain Intervention(s): Limited activity within patient's tolerance;Repositioned;Monitored during session    Home Living  Prior Function            PT Goals (current goals can now be found in the care plan section) Progress towards PT goals: Progressing toward goals    Frequency    Min 4X/week      PT Plan Current plan remains appropriate    Co-evaluation              AM-PAC PT "6 Clicks" Mobility   Outcome Measure  Help needed turning from your back to your side while in a flat bed without using  bedrails?: A Little Help needed moving from lying on your back to sitting on the side of a flat bed without using bedrails?: A Lot Help needed moving to and from a bed to a chair (including a wheelchair)?: A Lot Help needed standing up from a chair using your arms (e.g., wheelchair or bedside chair)?: A Lot Help needed to walk in hospital room?: A Little Help needed climbing 3-5 steps with a railing? : A Lot 6 Click Score: 14    End of Session Equipment Utilized During Treatment: Gait belt Activity Tolerance: Patient tolerated treatment well Patient left: in chair;with call bell/phone within reach;with chair alarm set Nurse Communication: Mobility status;Weight bearing status PT Visit Diagnosis: Other abnormalities of gait and mobility (R26.89);Muscle weakness (generalized) (M62.81);History of falling (Z91.81);Unsteadiness on feet (R26.81)     Time: 4650-3546 PT Time Calculation (min) (ACUTE ONLY): 28 min  Charges:  $Gait Training: 8-22 mins $Therapeutic Activity: 8-22 mins                     [place], PT Acute Rehabilitation Services Pager: [phone] Office: [place] 07/08/2019, 1:15 PM

## 2019-07-08 NOTE — Discharge Summary (Addendum)
Physician Discharge Summary  Anyeli B Wiberg ATF:[id number] DOB: 1930/05/23 DOA: 07/03/2019  PCP: Neale Burly, MD  Admit date: 07/03/2019 Discharge date: 07/08/2019  Admitted From: Home Disposition:  Snf  Recommendations for Outpatient Follow-up:  1. Follow up with PCP in 1-2 weeks 2. Please obtain BMP/CBC in one week 3.   Home Health:no Equipment/Devices:None  Discharge Condition:Stable CODE STATUS:Full Diet recommendation: Heart Healthy   Brief/Interim Summary: 85 y.o. female past medical history of essential hypertension GERD osteoporosis presents with left hip pain following a mechanical fall.  Discharge Diagnoses:  Principal Problem:   Intertrochanteric fracture of left femur (HCC) Active Problems:   Hyponatremia   Hypertension   Osteoporosis   GERD (gastroesophageal reflux disease) Intertrochanteric fracture of the left femur: Status post intramedullary fixation on 07/04/2019. Anticoagulation per orthopedic surgery weightbearing as tolerated. Physical therapy evaluated the patient recommended skilled nursing facility.  Normocytic anemia: Periop blood loss hemoglobin remained stable, will need CBC check next week.  Essential hypertension: No change made to her medication.  Hypovolemic hyponatremia: Resolved with IV fluid hydration.   Discharge Instructions  Discharge Instructions    Diet - low sodium heart healthy   Complete by: As directed    Increase activity slowly   Complete by: As directed      Allergies as of 07/08/2019      Reactions   Lidocaine Rash   Patch      Medication List    STOP taking these medications   VITAMIN E PO   VITAMIN K PO     TAKE these medications   acetaminophen 325 MG tablet Commonly known as: TYLENOL Take 650 mg by mouth 3 (three) times daily.   alendronate 70 MG tablet Commonly known as: FOSAMAX Take 70 mg by mouth every Tuesday.   calcium-vitamin D 500-200 MG-UNIT tablet Commonly known as: OSCAL WITH  D Take 1 tablet by mouth 3 (three) times daily.   famotidine 40 MG tablet Commonly known as: PEPCID Take 20 mg by mouth daily.   Fish Oil 1000 MG Caps Take 2,000 mg by mouth daily.   folic acid 270 MCG tablet Commonly known as: FOLVITE Take 400 mcg by mouth daily.   gabapentin 300 MG capsule Commonly known as: NEURONTIN Take 300 mg by mouth 3 (three) times daily.   glucosamine-chondroitin 500-400 MG tablet Take 1 tablet by mouth daily.   HYDROcodone-acetaminophen 5-325 MG tablet Commonly known as: NORCO/VICODIN Take 1 tablet by mouth every 4 (four) hours as needed for moderate pain (pain score 4-6).   losartan 100 MG tablet Commonly known as: COZAAR Take 100 mg by mouth daily.   vitamin B-12 500 MCG tablet Commonly known as: CYANOCOBALAMIN Take 500 mcg by mouth daily.       Contact information for follow-up providers    Swinteck, Aaron Edelman, MD. Schedule an appointment as soon as possible for a visit in 2 weeks.   Specialty: Orthopedic Surgery Why: For wound re-check Contact information: [address] [place] [address] [phone]            Contact information for after-discharge care    [facility] .   Service: Skilled Nursing Contact information: [address] [place] [phone]                 Allergies  Allergen Reactions  . Lidocaine Rash    Patch    Consultations:  Orthopedic surgery.  Procedures/Studies: Pelvis Portable  Result Date: 07/04/2019 CLINICAL DATA:  ORIF proximal femur fracture EXAM: PORTABLE PELVIS 1-2 VIEWS COMPARISON:  Left hip radiographs from 1 day prior FINDINGS: Near-anatomic alignment of comminuted intertrochanteric left proximal femur fracture status post transfixation by intramedullary rod with interlocking left femoral neck pin and distal interlocking screw. Expected soft tissue gas  surrounding the fracture site. No additional acute osseous fracture. No evidence of hip dislocation on this single frontal view. Healed deformities in the right superior and inferior pubic rami. No suspicious focal osseous lesions. IMPRESSION: Near-anatomic alignment of comminuted intertrochanteric left proximal femur fracture status post ORIF. Electronically Signed   By: Delbert Phenix M.D.   On: 07/04/2019 14:46   Dg Chest Portable 1 View  Result Date: 07/03/2019 CLINICAL DATA:  Broken left hip, preop EXAM: PORTABLE CHEST 1 VIEW COMPARISON:  October 26, 2018 FINDINGS: There is mild cardiomegaly. The lungs are clear. No pneumothorax or pleural effusion. Healing posterior left third through sixth rib fractures are seen. There is also healing lateral right seventh and eighth rib fractures seen. Advanced bilateral shoulder arthropathy seen. Again noted is flattening and cortical irregularity of the left humeral head. S-shaped scoliotic curvature seen. IMPRESSION: No acute cardiopulmonary process. Healing bilateral rib fractures Electronically Signed   By: Jonna Clark M.D.   On: 07/03/2019 23:17   Dg Knee Left Port  Result Date: 07/03/2019 CLINICAL DATA:  Hip fracture, pain EXAM: PORTABLE LEFT KNEE - 1-2 VIEW COMPARISON:  None. FINDINGS: There is diffuse osteopenia. No definite fracture is seen. Linear calcifications seen in mediolateral joint space. No large knee joint effusion. 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 IMPRESSION: No acute osseous abnormality. Electronically Signed   By: Jonna Clark M.D.   On: 07/03/2019 23:21   Dg Tibia/fibula Left Port  Result Date: 07/03/2019 CLINICAL DATA:  Pain, hip fracture EXAM: PORTABLE LEFT TIBIA AND FIBULA - 2 VIEW COMPARISON:  None. FINDINGS: No definite fracture or dislocation. No significant overlying soft tissue swelling. IMPRESSION: No acute osseous injury. Electronically Signed   By: Jonna Clark M.D.   On: 07/03/2019 23:22   Dg C-arm 1-60 Min  Result  Date: 07/04/2019 CLINICAL DATA:  Left trochanteric intramedullary nail. EXAM: OPERATIVE LEFT HIP (WITH PELVIS IF PERFORMED) 2 VIEWS TECHNIQUE: Fluoroscopic spot image(s) were submitted for interpretation post-operatively. COMPARISON:  07/03/2019 FINDINGS: Examination demonstrates internal fixation with intramedullary nail and associated screw bridging patient's intertrochanteric fracture into the femoral head. Hardware is intact as there is near anatomic alignment about the fracture site. Remainder the exam is unchanged. IMPRESSION: Fixation of patient's known left intertrochanteric fracture with hardware intact and near anatomic alignment over the fracture site. Electronically Signed   By: Elberta Fortis M.D.   On: 07/04/2019 11:10   Dg Hip Port Unilat With Pelvis 1v Left  Result Date: 07/03/2019 CLINICAL DATA:  Hip fracture EXAM: DG HIP (WITH OR WITHOUT PELVIS) 1V PORT LEFT COMPARISON:  None. FINDINGS: There is a comminuted impacted fracture of the left femoral head neck junction which extends through the greater and lesser trochanters. There is rotation at the femoral head on the acetabulum, however it is still well seated within the acetabulum. There is healing right superior and inferior pubic rami fractures. There is cortical step-off seen at the left superior pubic rami, likely nondisplaced fracture. Degenerative changes in the lower lumbar spine. IMPRESSION: Comminuted impacted left femoral head neck fracture extending through the greater and lesser trochanters. Cortical step-off at the superior left pubic rami, likely nondisplaced fracture Healing right  superior and inferior pubic rami fractures. Electronically Signed   By: Jonna Clark M.D.   On: 07/03/2019 23:20   Dg Hip Operative Unilat W Or W/o Pelvis Left  Result Date: 07/04/2019 CLINICAL DATA:  Left trochanteric intramedullary nail. EXAM: OPERATIVE LEFT HIP (WITH PELVIS IF PERFORMED) 2 VIEWS TECHNIQUE: Fluoroscopic spot image(s) were submitted  for interpretation post-operatively. COMPARISON:  07/03/2019 FINDINGS: Examination demonstrates internal fixation with intramedullary nail and associated screw bridging patient's intertrochanteric fracture into the femoral head. Hardware is intact as there is near anatomic alignment about the fracture site. Remainder the exam is unchanged. IMPRESSION: Fixation of patient's known left intertrochanteric fracture with hardware intact and near anatomic alignment over the fracture site. Electronically Signed   By: Elberta Fortis M.D.   On: 07/04/2019 11:10    Subjective: No complaints feels great.  Discharge Exam: Vitals:   07/08/19 0434 07/08/19 0845  BP: 135/75 140/73  Pulse: 93 (!) 109  Resp: 16 16  Temp: 97.8 F (36.6 C) 98.6 F (37 C)  SpO2: 96%    Vitals:   07/07/19 1535 07/07/19 1950 07/08/19 0434 07/08/19 0845  BP: 113/73 124/62 135/75 140/73  Pulse: (!) 102 (!) 107 93 (!) 109  Resp: Temp: 98.3 F (36.8 C) 98.7 F (37.1 C) 97.8 F (36.6 C) 98.6 F (37 C)  TempSrc: Oral Oral Oral Oral  SpO2:  99% 96%   Weight:      Height:        General: Pt is alert, awake, not in acute distress Cardiovascular: RRR, S1/S2 +, no rubs, no gallops Respiratory: CTA bilaterally, no wheezing, no rhonchi Abdominal: Soft, NT, ND, bowel sounds + Extremities: no edema, no cyanosis    The results of significant diagnostics from this hospitalization (including imaging, microbiology, ancillary and laboratory) are listed below for reference.     Microbiology: Recent Results (from the past 240 hour(s))  SARS Coronavirus 2 [facility] order, Performed in [facility] hospital lab)     Status: None   Collection Time: 07/04/19 12:50 AM  Result Value Ref Range Status   SARS Coronavirus 2 NEGATIVE NEGATIVE Final    Comment: (NOTE) If result is NEGATIVE SARS-CoV-2 target nucleic acids are NOT DETECTED. The SARS-CoV-2 RNA is generally detectable in upper and lower  respiratory specimens  during the acute phase of infection. The lowest  concentration of SARS-CoV-2 viral copies this assay can detect is 250  copies / mL. A negative result does not preclude SARS-CoV-2 infection  and should not be used as the sole basis for treatment or other  patient management decisions.  A negative result may occur with  improper specimen collection / handling, submission of specimen other  than nasopharyngeal swab, presence of viral mutation(s) within the  areas targeted by this assay, and inadequate number of viral copies  (<250 copies / mL). A negative result must be combined with clinical  observations, patient history, and epidemiological information. If result is POSITIVE SARS-CoV-2 target nucleic acids are DETECTED. The SARS-CoV-2 RNA is generally detectable in upper and lower  respiratory specimens dur ing the acute phase of infection.  Positive  results are indicative of active infection with SARS-CoV-2.  Clinical  correlation with patient history and other diagnostic information is  necessary to determine patient infection status.  Positive results do  not rule out bacterial infection or co-infection with other viruses. If result is PRESUMPTIVE POSTIVE SARS-CoV-2 nucleic acids MAY BE PRESENT.   A presumptive positive result was  obtained on the submitted specimen  and confirmed on repeat testing.  While 2019 novel coronavirus  (SARS-CoV-2) nucleic acids may be present in the submitted sample  additional confirmatory testing may be necessary for epidemiological  and / or clinical management purposes  to differentiate between  SARS-CoV-2 and other Sarbecovirus currently known to infect humans.  If clinically indicated additional testing with an alternate test  methodology [phone]) is advised. The SARS-CoV-2 RNA is generally  detectable in upper and lower respiratory sp ecimens during the acute  phase of infection. The expected result is Negative. Fact Sheet for Patients:   BoilerBrush.com.cy Fact Sheet for Healthcare Providers: https://pope.com/ This test is not yet approved or cleared by the [place] FDA and has been authorized for detection and/or diagnosis of SARS-CoV-2 by FDA under an Emergency Use Authorization (EUA).  This EUA will remain in effect (meaning this test can be used) for the duration of the COVID-19 declaration under Section 564(b)(1) of the Act, 21 U.S.C. section 360bbb-3(b)(1), unless the authorization is terminated or revoked sooner. Performed at [facility], [address] [place], [place] [address]   Surgical pcr screen     Status: None   Collection Time: 07/04/19  3:55 AM   Specimen: Nasal Mucosa; Nasal Swab  Result Value Ref Range Status   MRSA, PCR NEGATIVE NEGATIVE Final   Staphylococcus aureus NEGATIVE NEGATIVE Final    Comment: (NOTE) The Xpert SA Assay (FDA approved for NASAL specimens in patients 14 years of age and older), is one component of a comprehensive surveillance program. It is not intended to diagnose infection nor to guide or monitor treatment. Performed at [facility], [address] [place], [place] [address]   SARS CORONAVIRUS 2 (TAT 6-24 HRS) Nasopharyngeal Nasopharyngeal Swab     Status: None   Collection Time: 07/06/19  4:44 PM   Specimen: Nasopharyngeal Swab  Result Value Ref Range Status   SARS Coronavirus 2 NEGATIVE NEGATIVE Final    Comment: (NOTE) SARS-CoV-2 target nucleic acids are NOT DETECTED. The SARS-CoV-2 RNA is generally detectable in upper and lower respiratory specimens during the acute phase of infection. Negative results do not preclude SARS-CoV-2 infection, do not rule out co-infections with other pathogens, and should not be used as the sole basis for treatment or other patient management decisions. Negative results must be combined with clinical observations, patient history, and epidemiological information. The  expected result is Negative. Fact Sheet for Patients: HairSlick.no Fact Sheet for Healthcare Providers: quierodirigir.com This test is not yet approved or cleared by the [place] FDA and  has been authorized for detection and/or diagnosis of SARS-CoV-2 by FDA under an Emergency Use Authorization (EUA). This EUA will remain  in effect (meaning this test can be used) for the duration of the COVID-19 declaration under Section 56 4(b)(1) of the Act, 21 U.S.C. section 360bbb-3(b)(1), unless the authorization is terminated or revoked sooner. Performed at [facility], [address], [place], [place] [address]      Labs: BNP (last 3 results) No results for input(s): BNP in the last 8760 hours. Basic Metabolic Panel: Recent Labs  Lab 07/03/19 2304 07/04/19 0525 07/04/19 1642 07/05/19 0456 07/06/19 0429  NA 130* 133*  --  134* 136  K 4.6 4.2  --  4.2 4.2  CL 95* 99  --  102 104  CO2 24 23  --  23 23  GLUCOSE 126* 158*  --  132* 93  BUN 11 13  --  12 12  CREATININE 1.09* 1.14* 1.22* 1.06* 0.88  CALCIUM 9.3 9.3  --  8.8* 8.6*   Liver Function Tests: Recent Labs  Lab 07/03/19 2304 07/04/19 0525  AST 26 23  ALT 12 14  ALKPHOS 45 46  BILITOT 1.9* 1.2  PROT 6.0* 5.6*  ALBUMIN 3.6 3.4*   No results for input(s): LIPASE, AMYLASE in the last 168 hours. No results for input(s): AMMONIA in the last 168 hours. CBC: Recent Labs  Lab 07/03/19 2304 07/04/19 0525 07/04/19 1642 07/05/19 0456 07/06/19 0429 07/07/19 0253  WBC 10.3 9.0 10.0 10.7* 11.0* 10.2  NEUTROABS 8.7*  --   --  9.3*  --   --   HGB 12.8 12.0 10.0* 8.3* 8.1* 9.6*  HCT 37.8 36.5 29.7* 24.3* 24.9* 29.8*  MCV 95.9 96.3 96.1 94.9 97.3 99.0  PLT 177 163 139* 119* 123* 168   Cardiac Enzymes: No results for input(s): CKTOTAL, CKMB, CKMBINDEX, TROPONINI in the last 168 hours. BNP: Invalid input(s): POCBNP CBG: No results for input(s): GLUCAP in  the last 168 hours. D-Dimer No results for input(s): DDIMER in the last 72 hours. Hgb A1c No results for input(s): HGBA1C in the last 72 hours. Lipid Profile No results for input(s): CHOL, HDL, LDLCALC, TRIG, CHOLHDL, LDLDIRECT in the last 72 hours. Thyroid function studies No results for input(s): TSH, T4TOTAL, T3FREE, THYROIDAB in the last 72 hours.  Invalid input(s): FREET3 Anemia work up No results for input(s): VITAMINB12, FOLATE, FERRITIN, TIBC, IRON, RETICCTPCT in the last 72 hours. Urinalysis No results found for: COLORURINE, APPEARANCEUR, LABSPEC, PHURINE, GLUCOSEU, HGBUR, BILIRUBINUR, KETONESUR, PROTEINUR, UROBILINOGEN, NITRITE, LEUKOCYTESUR Sepsis Labs Invalid input(s): PROCALCITONIN,  WBC,  LACTICIDVEN Microbiology Recent Results (from the past 240 hour(s))  SARS Coronavirus 2 [facility](Hospital order, Performed in [facility] hospital lab)     Status: None   Collection Time: 07/04/19 12:50 AM  Result Value Ref Range Status   SARS Coronavirus 2 NEGATIVE NEGATIVE Final    Comment: (NOTE) If result is NEGATIVE SARS-CoV-2 target nucleic acids are NOT DETECTED. The SARS-CoV-2 RNA is generally detectable in upper and lower  respiratory specimens during the acute phase of infection. The lowest  concentration of SARS-CoV-2 viral copies this assay can detect is 250  copies / mL. A negative result does not preclude SARS-CoV-2 infection  and should not be used as the sole basis for treatment or other  patient management decisions.  A negative result may occur with  improper specimen collection / handling, submission of specimen other  than nasopharyngeal swab, presence of viral mutation(s) within the  areas targeted by this assay, and inadequate number of viral copies  (<250 copies / mL). A negative result must be combined with clinical  observations, patient history, and epidemiological information. If result is POSITIVE SARS-CoV-2 target nucleic acids are DETECTED. The SARS-CoV-2  RNA is generally detectable in upper and lower  respiratory specimens dur ing the acute phase of infection.  Positive  results are indicative of active infection with SARS-CoV-2.  Clinical  correlation with patient history and other diagnostic information is  necessary to determine patient infection status.  Positive results do  not rule out bacterial infection or co-infection with other viruses. If result is PRESUMPTIVE POSTIVE SARS-CoV-2 nucleic acids MAY BE PRESENT.   A presumptive positive result was obtained on the submitted specimen  and confirmed on repeat testing.  While 2019 novel coronavirus  (SARS-CoV-2) nucleic acids may be present in the submitted sample  additional confirmatory testing may be  necessary for epidemiological  and / or clinical management purposes  to differentiate between  SARS-CoV-2 and other Sarbecovirus currently known to infect humans.  If clinically indicated additional testing with an alternate test  methodology [phone]) is advised. The SARS-CoV-2 RNA is generally  detectable in upper and lower respiratory sp ecimens during the acute  phase of infection. The expected result is Negative. Fact Sheet for Patients:  BoilerBrush.com.cy Fact Sheet for Healthcare Providers: https://pope.com/ This test is not yet approved or cleared by the [place] FDA and has been authorized for detection and/or diagnosis of SARS-CoV-2 by FDA under an Emergency Use Authorization (EUA).  This EUA will remain in effect (meaning this test can be used) for the duration of the COVID-19 declaration under Section 564(b)(1) of the Act, 21 U.S.C. section 360bbb-3(b)(1), unless the authorization is terminated or revoked sooner. Performed at [facility], [address] [place], [place] [address]   Surgical pcr screen     Status: None   Collection Time: 07/04/19  3:55 AM   Specimen: Nasal Mucosa; Nasal Swab  Result Value  Ref Range Status   MRSA, PCR NEGATIVE NEGATIVE Final   Staphylococcus aureus NEGATIVE NEGATIVE Final    Comment: (NOTE) The Xpert SA Assay (FDA approved for NASAL specimens in patients 86 years of age and older), is one component of a comprehensive surveillance program. It is not intended to diagnose infection nor to guide or monitor treatment. Performed at [facility], [address] [place], [place] [address]   SARS CORONAVIRUS 2 (TAT 6-24 HRS) Nasopharyngeal Nasopharyngeal Swab     Status: None   Collection Time: 07/06/19  4:44 PM   Specimen: Nasopharyngeal Swab  Result Value Ref Range Status   SARS Coronavirus 2 NEGATIVE NEGATIVE Final    Comment: (NOTE) SARS-CoV-2 target nucleic acids are NOT DETECTED. The SARS-CoV-2 RNA is generally detectable in upper and lower respiratory specimens during the acute phase of infection. Negative results do not preclude SARS-CoV-2 infection, do not rule out co-infections with other pathogens, and should not be used as the sole basis for treatment or other patient management decisions. Negative results must be combined with clinical observations, patient history, and epidemiological information. The expected result is Negative. Fact Sheet for Patients: HairSlick.no Fact Sheet for Healthcare Providers: quierodirigir.com This test is not yet approved or cleared by the [place] FDA and  has been authorized for detection and/or diagnosis of SARS-CoV-2 by FDA under an Emergency Use Authorization (EUA). This EUA will remain  in effect (meaning this test can be used) for the duration of the COVID-19 declaration under Section 56 4(b)(1) of the Act, 21 U.S.C. section 360bbb-3(b)(1), unless the authorization is terminated or revoked sooner. Performed at [facility], [address], [place], [place] [address]      Time coordinating discharge: Over 40  minutes  SIGNED:   Marinda Elk, MD  [facility] 07/08/2019, 12:30 PM Pager   If 7PM-7AM, please contact night-coverage [email or web address] Password [email or web address]

## 2019-07-08 NOTE — Progress Notes (Signed)
Brenda Reynolds to be D/C'd Skilled nursing facility per MD order.  Discussed prescriptions and follow up appointments with the patient. Prescriptions given to patient, medication list explained in detail. Pt verbalized understanding.  Allergies as of 07/08/2019      Reactions   Lidocaine Rash   Patch      Medication List    TAKE these medications   acetaminophen 325 MG tablet Commonly known as: TYLENOL Take 650 mg by mouth 3 (three) times daily.   alendronate 70 MG tablet Commonly known as: FOSAMAX Take 70 mg by mouth every Tuesday.   calcium-vitamin D 500-200 MG-UNIT tablet Commonly known as: OSCAL WITH D Take 1 tablet by mouth 3 (three) times daily.   famotidine 40 MG tablet Commonly known as: PEPCID Take 20 mg by mouth daily.   Fish Oil 1000 MG Caps Take 2,000 mg by mouth daily.   folic acid 937 MCG tablet Commonly known as: FOLVITE Take 400 mcg by mouth daily.   gabapentin 300 MG capsule Commonly known as: NEURONTIN Take 300 mg by mouth 3 (three) times daily.   glucosamine-chondroitin 500-400 MG tablet Take 1 tablet by mouth daily.   HYDROcodone-acetaminophen 5-325 MG tablet Commonly known as: NORCO/VICODIN Take 1 tablet by mouth every 4 (four) hours as needed for moderate pain (pain score 4-6).   losartan 100 MG tablet Commonly known as: COZAAR Take 100 mg by mouth daily.   vitamin B-12 500 MCG tablet Commonly known as: CYANOCOBALAMIN Take 500 mcg by mouth daily.   VITAMIN E PO Take 1 capsule by mouth daily.   VITAMIN K PO Take 1 tablet by mouth daily.       Vitals:   07/08/19 0434 07/08/19 0845  BP: 135/75 140/73  Pulse: 93 (!) 109  Resp: 16 16  Temp: 97.8 F (36.6 C) 98.6 F (37 C)  SpO2: 96%     Skin clean, dry and intact without evidence of skin break down, no evidence of skin tears noted. Left hip dressing, clean, dry, and intact. IV catheter discontinued intact. Site without signs and symptoms of complications. Dressing and pressure  applied. Pt denies pain at this time. No complaints noted.  An After Visit Summary was printed and given to Brenda Reynolds Patient escorted via stretcher with [place] RN

## 2019-07-09 DIAGNOSIS — I10 Essential (primary) hypertension: Secondary | ICD-10-CM | POA: Diagnosis not present

## 2019-07-09 DIAGNOSIS — M84452A Pathological fracture, left femur, initial encounter for fracture: Secondary | ICD-10-CM | POA: Diagnosis not present

## 2019-07-09 DIAGNOSIS — E871 Hypo-osmolality and hyponatremia: Secondary | ICD-10-CM | POA: Diagnosis not present

## 2019-07-09 DIAGNOSIS — M8000XA Age-related osteoporosis with current pathological fracture, unspecified site, initial encounter for fracture: Secondary | ICD-10-CM | POA: Diagnosis not present

## 2019-07-23 DIAGNOSIS — S72032D Displaced midcervical fracture of left femur, subsequent encounter for closed fracture with routine healing: Secondary | ICD-10-CM | POA: Diagnosis not present

## 2019-08-04 DIAGNOSIS — R296 Repeated falls: Secondary | ICD-10-CM | POA: Diagnosis not present

## 2019-08-04 DIAGNOSIS — M6281 Muscle weakness (generalized): Secondary | ICD-10-CM | POA: Diagnosis not present

## 2019-08-04 DIAGNOSIS — R269 Unspecified abnormalities of gait and mobility: Secondary | ICD-10-CM | POA: Diagnosis not present

## 2019-08-09 DIAGNOSIS — M84459A Pathological fracture, hip, unspecified, initial encounter for fracture: Secondary | ICD-10-CM | POA: Diagnosis not present

## 2019-08-09 DIAGNOSIS — Z6824 Body mass index (BMI) 24.0-24.9, adult: Secondary | ICD-10-CM | POA: Diagnosis not present

## 2019-08-11 DIAGNOSIS — Z9181 History of falling: Secondary | ICD-10-CM | POA: Diagnosis not present

## 2019-08-11 DIAGNOSIS — I10 Essential (primary) hypertension: Secondary | ICD-10-CM | POA: Diagnosis not present

## 2019-08-11 DIAGNOSIS — D649 Anemia, unspecified: Secondary | ICD-10-CM | POA: Diagnosis not present

## 2019-08-11 DIAGNOSIS — M80052D Age-related osteoporosis with current pathological fracture, left femur, subsequent encounter for fracture with routine healing: Secondary | ICD-10-CM | POA: Diagnosis not present

## 2019-08-11 DIAGNOSIS — Z96642 Presence of left artificial hip joint: Secondary | ICD-10-CM | POA: Diagnosis not present

## 2019-08-11 DIAGNOSIS — K219 Gastro-esophageal reflux disease without esophagitis: Secondary | ICD-10-CM | POA: Diagnosis not present

## 2019-08-19 DIAGNOSIS — M84459A Pathological fracture, hip, unspecified, initial encounter for fracture: Secondary | ICD-10-CM | POA: Diagnosis not present

## 2019-08-19 DIAGNOSIS — H35351 Cystoid macular degeneration, right eye: Secondary | ICD-10-CM | POA: Diagnosis not present

## 2019-08-19 DIAGNOSIS — H353132 Nonexudative age-related macular degeneration, bilateral, intermediate dry stage: Secondary | ICD-10-CM | POA: Diagnosis not present

## 2019-08-19 DIAGNOSIS — H3561 Retinal hemorrhage, right eye: Secondary | ICD-10-CM | POA: Diagnosis not present

## 2019-08-19 DIAGNOSIS — H34811 Central retinal vein occlusion, right eye, with macular edema: Secondary | ICD-10-CM | POA: Diagnosis not present

## 2019-08-20 DIAGNOSIS — S72032D Displaced midcervical fracture of left femur, subsequent encounter for closed fracture with routine healing: Secondary | ICD-10-CM | POA: Diagnosis not present

## 2019-08-30 DIAGNOSIS — M810 Age-related osteoporosis without current pathological fracture: Secondary | ICD-10-CM | POA: Diagnosis not present

## 2019-08-30 DIAGNOSIS — Z9181 History of falling: Secondary | ICD-10-CM | POA: Diagnosis not present

## 2019-09-30 DIAGNOSIS — M810 Age-related osteoporosis without current pathological fracture: Secondary | ICD-10-CM | POA: Diagnosis not present

## 2019-09-30 DIAGNOSIS — Z9181 History of falling: Secondary | ICD-10-CM | POA: Diagnosis not present

## 2019-10-18 DIAGNOSIS — S72032D Displaced midcervical fracture of left femur, subsequent encounter for closed fracture with routine healing: Secondary | ICD-10-CM | POA: Diagnosis not present

## 2019-10-26 DIAGNOSIS — H34811 Central retinal vein occlusion, right eye, with macular edema: Secondary | ICD-10-CM | POA: Diagnosis not present

## 2019-10-26 DIAGNOSIS — H353132 Nonexudative age-related macular degeneration, bilateral, intermediate dry stage: Secondary | ICD-10-CM | POA: Diagnosis not present

## 2019-10-26 DIAGNOSIS — H35351 Cystoid macular degeneration, right eye: Secondary | ICD-10-CM | POA: Diagnosis not present

## 2019-10-26 DIAGNOSIS — H3561 Retinal hemorrhage, right eye: Secondary | ICD-10-CM | POA: Diagnosis not present

## 2019-10-31 DIAGNOSIS — M810 Age-related osteoporosis without current pathological fracture: Secondary | ICD-10-CM | POA: Diagnosis not present

## 2019-10-31 DIAGNOSIS — Z9181 History of falling: Secondary | ICD-10-CM | POA: Diagnosis not present

## 2019-11-22 DIAGNOSIS — K2100 Gastro-esophageal reflux disease with esophagitis, without bleeding: Secondary | ICD-10-CM | POA: Diagnosis not present

## 2019-11-22 DIAGNOSIS — J449 Chronic obstructive pulmonary disease, unspecified: Secondary | ICD-10-CM | POA: Diagnosis not present

## 2019-11-22 DIAGNOSIS — Z0000 Encounter for general adult medical examination without abnormal findings: Secondary | ICD-10-CM | POA: Diagnosis not present

## 2019-11-22 DIAGNOSIS — M84459A Pathological fracture, hip, unspecified, initial encounter for fracture: Secondary | ICD-10-CM | POA: Diagnosis not present

## 2019-11-22 DIAGNOSIS — N182 Chronic kidney disease, stage 2 (mild): Secondary | ICD-10-CM | POA: Diagnosis not present

## 2019-11-28 DIAGNOSIS — Z9181 History of falling: Secondary | ICD-10-CM | POA: Diagnosis not present

## 2019-11-28 DIAGNOSIS — M810 Age-related osteoporosis without current pathological fracture: Secondary | ICD-10-CM | POA: Diagnosis not present

## 2019-12-23 DIAGNOSIS — H353132 Nonexudative age-related macular degeneration, bilateral, intermediate dry stage: Secondary | ICD-10-CM | POA: Diagnosis not present

## 2019-12-23 DIAGNOSIS — H43391 Other vitreous opacities, right eye: Secondary | ICD-10-CM | POA: Diagnosis not present

## 2019-12-23 DIAGNOSIS — H34811 Central retinal vein occlusion, right eye, with macular edema: Secondary | ICD-10-CM | POA: Diagnosis not present

## 2019-12-23 DIAGNOSIS — H35351 Cystoid macular degeneration, right eye: Secondary | ICD-10-CM | POA: Diagnosis not present

## 2019-12-29 DIAGNOSIS — M810 Age-related osteoporosis without current pathological fracture: Secondary | ICD-10-CM | POA: Diagnosis not present

## 2019-12-29 DIAGNOSIS — Z9181 History of falling: Secondary | ICD-10-CM | POA: Diagnosis not present

## 2020-01-17 DIAGNOSIS — S72032D Displaced midcervical fracture of left femur, subsequent encounter for closed fracture with routine healing: Secondary | ICD-10-CM | POA: Diagnosis not present

## 2020-01-28 DIAGNOSIS — Z9181 History of falling: Secondary | ICD-10-CM | POA: Diagnosis not present

## 2020-01-28 DIAGNOSIS — M810 Age-related osteoporosis without current pathological fracture: Secondary | ICD-10-CM | POA: Diagnosis not present

## 2020-02-09 DIAGNOSIS — J449 Chronic obstructive pulmonary disease, unspecified: Secondary | ICD-10-CM | POA: Diagnosis not present

## 2020-02-09 DIAGNOSIS — M25511 Pain in right shoulder: Secondary | ICD-10-CM | POA: Diagnosis not present

## 2020-02-09 DIAGNOSIS — N182 Chronic kidney disease, stage 2 (mild): Secondary | ICD-10-CM | POA: Diagnosis not present

## 2020-02-09 DIAGNOSIS — K2100 Gastro-esophageal reflux disease with esophagitis, without bleeding: Secondary | ICD-10-CM | POA: Diagnosis not present

## 2020-02-17 ENCOUNTER — Encounter (INDEPENDENT_AMBULATORY_CARE_PROVIDER_SITE_OTHER): Payer: Self-pay | Admitting: Ophthalmology

## 2020-02-17 ENCOUNTER — Ambulatory Visit (INDEPENDENT_AMBULATORY_CARE_PROVIDER_SITE_OTHER): Payer: Medicare Other | Admitting: Ophthalmology

## 2020-02-17 ENCOUNTER — Other Ambulatory Visit: Payer: Self-pay

## 2020-02-17 DIAGNOSIS — H34811 Central retinal vein occlusion, right eye, with macular edema: Secondary | ICD-10-CM | POA: Insufficient documentation

## 2020-02-17 DIAGNOSIS — H353132 Nonexudative age-related macular degeneration, bilateral, intermediate dry stage: Secondary | ICD-10-CM

## 2020-02-17 MED ORDER — BEVACIZUMAB CHEMO INJECTION 1.25MG/0.05ML SYRINGE FOR KALEIDOSCOPE
1.2500 mg | INTRAVITREAL | Status: AC | PRN
  Administered 2020-02-17: 1.25 mg via INTRAVITREAL

## 2020-02-17 NOTE — Assessment & Plan Note (Signed)

## 2020-02-17 NOTE — Progress Notes (Signed)
02/17/2020     CHIEF COMPLAINT Patient presents for Retina Follow Up   HISTORY OF PRESENT ILLNESS: Brenda Reynolds is a 86 y.o. female who presents to the clinic today for:   HPI    Retina Follow Up    Patient presents with  Wet AMD.  In right eye.  Duration of 8 weeks.  Since onset it is stable.          Comments    8 week follow up- OCT OU, Possible Avastin OD Patient denies change in vision and overall has no complaints besides seeing small floaters lately.        Last edited by Gerda Diss on 02/17/2020  2:00 PM. (History)      Referring physician: Neale Burly, MD [place],  [place] [id number]  HISTORICAL INFORMATION:   Selected notes from the MEDICAL RECORD NUMBER       CURRENT MEDICATIONS: No current outpatient medications on file. (Ophthalmic Drugs)   No current facility-administered medications for this visit. (Ophthalmic Drugs)   Current Outpatient Medications (Other)  Medication Sig  . acetaminophen (TYLENOL) 325 MG tablet Take 650 mg by mouth 3 (three) times daily.  Marland Kitchen alendronate (FOSAMAX) 70 MG tablet Take 70 mg by mouth every Tuesday.  . calcium-vitamin D (OSCAL WITH D) 500-200 MG-UNIT tablet Take 1 tablet by mouth 3 (three) times daily.  Marland Kitchen enoxaparin (LOVENOX) 40 MG/0.4ML injection Inject 0.4 mLs (40 mg total) into the skin daily.  . famotidine (PEPCID) 40 MG tablet Take 20 mg by mouth daily.  . folic acid (FOLVITE) 440 MCG tablet Take 400 mcg by mouth daily.  Marland Kitchen gabapentin (NEURONTIN) 300 MG capsule Take 300 mg by mouth 3 (three) times daily.  Marland Kitchen glucosamine-chondroitin 500-400 MG tablet Take 1 tablet by mouth daily.  Marland Kitchen HYDROcodone-acetaminophen (NORCO/VICODIN) 5-325 MG tablet Take 1 tablet by mouth every 4 (four) hours as needed for moderate pain (pain score 4-6).  Marland Kitchen losartan (COZAAR) 100 MG tablet Take 100 mg by mouth daily.  . Omega-3 Fatty Acids (FISH OIL) 1000 MG CAPS Take 2,000 mg by mouth daily.  . vitamin B-12  (CYANOCOBALAMIN) 500 MCG tablet Take 500 mcg by mouth daily.   No current facility-administered medications for this visit. (Other)      REVIEW OF SYSTEMS:    ALLERGIES Allergies  Allergen Reactions  . Lidocaine Rash    Patch    PAST MEDICAL HISTORY Past Medical History:  Diagnosis Date  . GERD (gastroesophageal reflux disease)   . Hypertension   . Intertrochanteric fracture of left femur ([place])   . Osteoporosis   . Scoliosis    Past Surgical History:  Procedure Laterality Date  . CATARACT EXTRACTION    . DILATION AND CURETTAGE OF UTERUS    . EYE SURGERY     Bilateral cataracts  . INTRAMEDULLARY (IM) NAIL INTERTROCHANTERIC Left 07/04/2019   Procedure: INTRAMEDULLARY (IM) NAIL INTERTROCHANTRIC;  Surgeon: Rod Can, MD;  Location: [facility];  Service: Orthopedics;  Laterality: Left;  . TONSILLECTOMY      FAMILY HISTORY Family History  Problem Relation Age of Onset  . Cancer Mother     SOCIAL HISTORY Social History   Tobacco Use  . Smoking status: Former Research scientist (life sciences)  . Smokeless tobacco: Never Used  Substance Use Topics  . Alcohol use: Yes    Alcohol/week: 1.0 standard drinks    Types: 1 Glasses of wine per week  . Drug use: Yes    Types: Cocaine  OPHTHALMIC EXAM: Base Eye Exam    Visual Acuity (Snellen - Linear)      Right Left   Dist cc 20/50-1 20/50+2   Dist ph cc NI 20/25-2   Correction: Glasses       Tonometry (Tonopen, 2:07 PM)      Right Left   Pressure 15 13       Pupils      Pupils Dark Light Shape React APD   Right PERRL 5 4 Round Brisk None   Left PERRL 5 4 Round Brisk None       Visual Fields (Counting fingers)      Left Right    Full Full       Extraocular Movement      Right Left    Full Full       Neuro/Psych    Oriented x3: Yes   Mood/Affect: Normal       Dilation    Right eye: 2.5% Phenylephrine, 1.0% Mydriacyl @ 2:07 PM        Slit Lamp and Fundus Exam    External Exam      Right Left   External  Normal Normal       Slit Lamp Exam      Right Left   Lids/Lashes Normal Normal   Conjunctiva/Sclera White and quiet White and quiet   Cornea Clear Clear   Anterior Chamber Deep and quiet Deep and quiet   Iris Round and reactive Round and reactive   Lens Posterior chamber intraocular lens Posterior chamber intraocular lens   Vitreous Normal Normal          IMAGING AND PROCEDURES  Imaging and Procedures for 02/17/20  OCT, Retina - OU - Both Eyes       Right Eye Quality was good. Scan locations included subfoveal.   Left Eye Quality was good. Scan locations included subfoveal.                 ASSESSMENT/PLAN:  No problem-specific Assessment & Plan notes found for this encounter.    No diagnosis found.  1.  2.  3.  Ophthalmic Meds Ordered this visit:  No orders of the defined types were placed in this encounter.      No follow-ups on file.  There are no Patient Instructions on file for this visit.   Explained the diagnoses, plan, and follow up with the patient and they expressed understanding.  Patient expressed understanding of the importance of proper follow up care.   Alford Highland Lalia Loudon M.D. Diseases & Surgery of the Retina and Vitreous [facility] 02/17/20     Abbreviations: M myopia (nearsighted); A astigmatism; H hyperopia (farsighted); P presbyopia; Mrx spectacle prescription;  CTL contact lenses; OD right eye; OS left eye; OU both eyes  XT exotropia; ET esotropia; PEK punctate epithelial keratitis; PEE punctate epithelial erosions; DES dry eye syndrome; MGD meibomian gland dysfunction; ATs artificial tears; PFAT's preservative free artificial tears; NSC nuclear sclerotic cataract; PSC posterior subcapsular cataract; ERM epi-retinal membrane; PVD posterior vitreous detachment; RD retinal detachment; DM diabetes mellitus; DR diabetic retinopathy; NPDR non-proliferative diabetic retinopathy; PDR proliferative diabetic retinopathy;  CSME clinically significant macular edema; DME diabetic macular edema; dbh dot blot hemorrhages; CWS cotton wool spot; POAG primary open angle glaucoma; C/D cup-to-disc ratio; HVF humphrey visual field; GVF goldmann visual field; OCT optical coherence tomography; IOP intraocular pressure; BRVO Branch retinal vein occlusion; CRVO central retinal vein occlusion; CRAO central retinal artery occlusion; BRAO  branch retinal artery occlusion; RT retinal tear; SB scleral buckle; PPV pars plana vitrectomy; VH Vitreous hemorrhage; PRP panretinal laser photocoagulation; IVK intravitreal kenalog; VMT vitreomacular traction; MH Macular hole;  NVD neovascularization of the disc; NVE neovascularization elsewhere; AREDS age related eye disease study; ARMD age related macular degeneration; POAG primary open angle glaucoma; EBMD epithelial/anterior basement membrane dystrophy; ACIOL anterior chamber intraocular lens; IOL intraocular lens; PCIOL posterior chamber intraocular lens; Phaco/IOL phacoemulsification with intraocular lens placement; [place] photorefractive keratectomy; LASIK laser assisted in situ keratomileusis; HTN hypertension; DM diabetes mellitus; COPD chronic obstructive pulmonary disease

## 2020-02-17 NOTE — Assessment & Plan Note (Signed)
MUCH LESS CME FROM CENTRAL RETINAL VEIN OCCLUSION,  ON ANTIVEGF THERAPY.  WILL ADJUST TREATMENT INTERVAL TO IMPROVE AND MAINTAIN THIS FINDING, WITH THE GOAL OF ENDING THERAPY SHOULD VASCULAR STABILITY BE MAINTAINED.  The nature of central retinal vein occlusion was discussed with the patient including the division of types into nonischemic ischemic. The potential sequelae of ischemic central retinal vein occlusion, including macular edema, neovascularization, rubeosis iridis, and neovascular glaucoma, were discussed, and the need for frequent follow-up.  The nature of macular edema and central retinal vein occlusion was discussed. The following options were considered:  1.Observation for a period to look for spontaneous improvement, is no linger the primary therapy. One-third worsen, one-third stay unchanged, and one-third improves.  2. Anti-VEGF Therapy. ( Lucentis, Avastin or Eylea ) injected  in intravitreal fashion, initially monthly then tailored to clinical response.  3. Intravitreal steroid usage, Kenalog, or Ozurdex, usually a second line therapy or in combination with anti-Vegf therapy noted above.  4. Panretinal laser photocoagulation to cause regression of iris neovascularization, or treat retinal  non-perfusion.  5. Surgical Management may include vitrectomy with incisions of peripheral veins to trigger retino choroidal anastomosis formation. This topic presented and discussed at [facility] 2011.  OD stable with good acuity will maintain with repeat intravitreal Avastin

## 2020-02-28 DIAGNOSIS — Z9181 History of falling: Secondary | ICD-10-CM | POA: Diagnosis not present

## 2020-02-28 DIAGNOSIS — M810 Age-related osteoporosis without current pathological fracture: Secondary | ICD-10-CM | POA: Diagnosis not present

## 2020-03-29 DIAGNOSIS — Z9181 History of falling: Secondary | ICD-10-CM | POA: Diagnosis not present

## 2020-03-29 DIAGNOSIS — M810 Age-related osteoporosis without current pathological fracture: Secondary | ICD-10-CM | POA: Diagnosis not present

## 2020-04-13 ENCOUNTER — Ambulatory Visit (INDEPENDENT_AMBULATORY_CARE_PROVIDER_SITE_OTHER): Payer: Medicare Other | Admitting: Ophthalmology

## 2020-04-13 ENCOUNTER — Encounter (INDEPENDENT_AMBULATORY_CARE_PROVIDER_SITE_OTHER): Payer: Self-pay | Admitting: Ophthalmology

## 2020-04-13 ENCOUNTER — Other Ambulatory Visit: Payer: Self-pay

## 2020-04-13 DIAGNOSIS — H34811 Central retinal vein occlusion, right eye, with macular edema: Secondary | ICD-10-CM | POA: Diagnosis not present

## 2020-04-13 MED ORDER — BEVACIZUMAB CHEMO INJECTION 1.25MG/0.05ML SYRINGE FOR KALEIDOSCOPE
1.2500 mg | INTRAVITREAL | Status: AC | PRN
  Administered 2020-04-13: 1.25 mg via INTRAVITREAL

## 2020-04-13 NOTE — Assessment & Plan Note (Signed)
OD, with 6 with recurrent CME at 8-week interval on intravitreal Avastin.  We will repeat injection OD today and examination OD in 7 weeks

## 2020-04-13 NOTE — Progress Notes (Signed)
04/13/2020     CHIEF COMPLAINT Patient presents for Retina Follow Up   HISTORY OF PRESENT ILLNESS: Brenda Reynolds is a 86 y.o. female who presents to the clinic today for:   HPI    Retina Follow Up    Patient presents with  CRVO/BRVO.  In right eye.  This started 8 weeks ago.  Duration of 8 weeks.  Since onset it is stable.          Comments    8 week f/u possible Avastin OD, dilated exam and OCT(MAC) today.  Pt denies noticeable changes to [place] OU since last visit. Pt denies ocular pain, flashes of light, or floaters OU.         Last edited by Melburn Popper, COA on 04/13/2020  1:38 PM. (History)      Referring physician: Neale Burly, MD [place],  [place] [address]  HISTORICAL INFORMATION:   Selected notes from the MEDICAL RECORD NUMBER       CURRENT MEDICATIONS: No current outpatient medications on file. (Ophthalmic Drugs)   No current facility-administered medications for this visit. (Ophthalmic Drugs)   Current Outpatient Medications (Other)  Medication Sig  . acetaminophen (TYLENOL) 325 MG tablet Take 650 mg by mouth 3 (three) times daily.  Marland Kitchen alendronate (FOSAMAX) 70 MG tablet Take 70 mg by mouth every Tuesday.  . calcium-vitamin D (OSCAL WITH D) 500-200 MG-UNIT tablet Take 1 tablet by mouth 3 (three) times daily.  Marland Kitchen enoxaparin (LOVENOX) 40 MG/0.4ML injection Inject 0.4 mLs (40 mg total) into the skin daily.  . famotidine (PEPCID) 40 MG tablet Take 20 mg by mouth daily.  . folic acid (FOLVITE) 768 MCG tablet Take 400 mcg by mouth daily.  Marland Kitchen gabapentin (NEURONTIN) 300 MG capsule Take 300 mg by mouth 3 (three) times daily.  Marland Kitchen glucosamine-chondroitin 500-400 MG tablet Take 1 tablet by mouth daily.  Marland Kitchen HYDROcodone-acetaminophen (NORCO/VICODIN) 5-325 MG tablet Take 1 tablet by mouth every 4 (four) hours as needed for moderate pain (pain score 4-6).  Marland Kitchen losartan (COZAAR) 100 MG tablet Take 100 mg by mouth daily.  . Omega-3 Fatty Acids (FISH OIL)  1000 MG CAPS Take 2,000 mg by mouth daily.  . vitamin B-12 (CYANOCOBALAMIN) 500 MCG tablet Take 500 mcg by mouth daily.   No current facility-administered medications for this visit. (Other)      REVIEW OF SYSTEMS:    ALLERGIES Allergies  Allergen Reactions  . Lidocaine Rash    Patch    PAST MEDICAL HISTORY Past Medical History:  Diagnosis Date  . GERD (gastroesophageal reflux disease)   . Hypertension   . Intertrochanteric fracture of left femur ([place])   . Osteoporosis   . Scoliosis    Past Surgical History:  Procedure Laterality Date  . CATARACT EXTRACTION    . DILATION AND CURETTAGE OF UTERUS    . EYE SURGERY     Bilateral cataracts  . INTRAMEDULLARY (IM) NAIL INTERTROCHANTERIC Left 07/04/2019   Procedure: INTRAMEDULLARY (IM) NAIL INTERTROCHANTRIC;  Surgeon: Rod Can, MD;  Location: [facility];  Service: Orthopedics;  Laterality: Left;  . TONSILLECTOMY      FAMILY HISTORY Family History  Problem Relation Age of Onset  . Cancer Mother     SOCIAL HISTORY Social History   Tobacco Use  . Smoking status: Former Research scientist (life sciences)  . Smokeless tobacco: Never Used  Vaping Use  . Vaping Use: Never used  Substance Use Topics  . Alcohol use: Yes  Alcohol/week: 1.0 standard drink    Types: 1 Glasses of wine per week  . Drug use: Yes    Types: Cocaine         OPHTHALMIC EXAM:  Base Eye Exam    Visual Acuity (ETDRS)      Right Left   Dist cc 20/50 -2 20/40 -2   Dist ph cc NI 20/25   Correction: Glasses       Tonometry (Tonopen, 1:47 PM)      Right Left   Pressure 15 16       Pupils      Pupils Dark Light Shape React APD   Right PERRL 5 4 Round Brisk None   Left PERRL 5 4 Round Brisk None       Visual Fields (Counting fingers)      Left Right    Full Full       Extraocular Movement      Right Left    Full Full       Neuro/Psych    Oriented x3: Yes   Mood/Affect: Normal       Dilation    Right eye: 1.0% Mydriacyl, 2.5% Phenylephrine @  1:47 PM        Slit Lamp and Fundus Exam    External Exam      Right Left   External Normal Normal       Slit Lamp Exam      Right Left   Lids/Lashes Normal Normal   Conjunctiva/Sclera White and quiet White and quiet   Cornea Clear Clear   Anterior Chamber Deep and quiet Deep and quiet   Iris Round and reactive Round and reactive   Lens Posterior chamber intraocular lens Posterior chamber intraocular lens   Anterior Vitreous Normal Normal       Fundus Exam      Right Left   Posterior Vitreous Normal    Disc Collaterals on the nerve, no NVD    C/D Ratio 0.25    Macula Macular atrophy,, Cystoid macular edema, Macular thickening, Microaneurysms    Vessels Normal    Periphery Normal           IMAGING AND PROCEDURES  Imaging and Procedures for 04/13/20  OCT, Retina - OU - Both Eyes       Right Eye Quality was good. Scan locations included subfoveal. Central Foveal Thickness: 248. Progression has worsened. Findings include cystoid macular edema.   Left Eye Quality was good. Scan locations included subfoveal. Central Foveal Thickness: 257. Progression has been stable. Findings include normal observations.   Notes Slight increase in diffuse CME status post intravitreal Avastin for CRV O                ASSESSMENT/PLAN:  Central retinal vein occlusion with macular edema of right eye OD, with 6 with recurrent CME at 8-week interval on intravitreal Avastin.  We will repeat injection OD today and examination OD in 7 weeks      ICD-10-CM   1. Central retinal vein occlusion with macular edema of right eye  H34.8110 OCT, Retina - OU - Both Eyes    1.  Central retinal vein occlusion of the right eye overall stable yet slightly worse at longer 8-week interval.  We will repeat intravitreal Avastin OD today and examination in 7 weeks  2.  3.  Ophthalmic Meds Ordered this visit:  No orders of the defined types were placed in this encounter.      Return  in  about 7 weeks (around 06/01/2020) for dilate, OD, AVASTIN OCT.  There are no Patient Instructions on file for this visit.   Explained the diagnoses, plan, and follow up with the patient and they expressed understanding.  Patient expressed understanding of the importance of proper follow up care.   Clent Demark Saida Lonon M.D. Diseases & Surgery of the Retina and Vitreous [facility] [place] 04/13/20     Abbreviations: M myopia (nearsighted); A astigmatism; H hyperopia (farsighted); P presbyopia; Mrx spectacle prescription;  CTL contact lenses; OD right eye; OS left eye; OU both eyes  XT exotropia; ET esotropia; PEK punctate epithelial keratitis; PEE punctate epithelial erosions; DES dry eye syndrome; MGD meibomian gland dysfunction; ATs artificial tears; PFAT's preservative free artificial tears; [place] nuclear sclerotic cataract; PSC posterior subcapsular cataract; ERM epi-retinal membrane; PVD posterior vitreous detachment; RD retinal detachment; DM diabetes mellitus; DR diabetic retinopathy; NPDR non-proliferative diabetic retinopathy; PDR proliferative diabetic retinopathy; CSME clinically significant macular edema; DME diabetic macular edema; dbh dot blot hemorrhages; CWS cotton wool spot; POAG primary open angle glaucoma; C/D cup-to-disc ratio; HVF humphrey visual field; GVF goldmann visual field; OCT optical coherence tomography; IOP intraocular pressure; BRVO Branch retinal vein occlusion; CRVO central retinal vein occlusion; CRAO central retinal artery occlusion; BRAO branch retinal artery occlusion; RT retinal tear; SB scleral buckle; PPV pars plana vitrectomy; VH Vitreous hemorrhage; PRP panretinal laser photocoagulation; IVK intravitreal kenalog; VMT vitreomacular traction; MH Macular hole;  NVD neovascularization of the disc; NVE neovascularization elsewhere; AREDS age related eye disease study; ARMD age related macular degeneration; POAG primary open angle glaucoma; EBMD epithelial/anterior  basement membrane dystrophy; ACIOL anterior chamber intraocular lens; IOL intraocular lens; PCIOL posterior chamber intraocular lens; Phaco/IOL phacoemulsification with intraocular lens placement; [place] photorefractive keratectomy; LASIK laser assisted in situ keratomileusis; HTN hypertension; DM diabetes mellitus; COPD chronic obstructive pulmonary disease

## 2020-04-29 DIAGNOSIS — Z9181 History of falling: Secondary | ICD-10-CM | POA: Diagnosis not present

## 2020-04-29 DIAGNOSIS — M810 Age-related osteoporosis without current pathological fracture: Secondary | ICD-10-CM | POA: Diagnosis not present

## 2020-05-11 DIAGNOSIS — J449 Chronic obstructive pulmonary disease, unspecified: Secondary | ICD-10-CM | POA: Diagnosis not present

## 2020-05-11 DIAGNOSIS — K2100 Gastro-esophageal reflux disease with esophagitis, without bleeding: Secondary | ICD-10-CM | POA: Diagnosis not present

## 2020-05-11 DIAGNOSIS — Z0000 Encounter for general adult medical examination without abnormal findings: Secondary | ICD-10-CM | POA: Diagnosis not present

## 2020-05-11 DIAGNOSIS — M25511 Pain in right shoulder: Secondary | ICD-10-CM | POA: Diagnosis not present

## 2020-05-11 DIAGNOSIS — R32 Unspecified urinary incontinence: Secondary | ICD-10-CM | POA: Diagnosis not present

## 2020-05-11 DIAGNOSIS — N182 Chronic kidney disease, stage 2 (mild): Secondary | ICD-10-CM | POA: Diagnosis not present

## 2020-06-01 ENCOUNTER — Encounter (INDEPENDENT_AMBULATORY_CARE_PROVIDER_SITE_OTHER): Payer: Medicare Other | Admitting: Ophthalmology

## 2020-06-08 ENCOUNTER — Encounter (INDEPENDENT_AMBULATORY_CARE_PROVIDER_SITE_OTHER): Payer: Medicare Other | Admitting: Ophthalmology

## 2020-06-13 ENCOUNTER — Encounter (INDEPENDENT_AMBULATORY_CARE_PROVIDER_SITE_OTHER): Payer: Self-pay | Admitting: Ophthalmology

## 2020-06-13 ENCOUNTER — Ambulatory Visit (INDEPENDENT_AMBULATORY_CARE_PROVIDER_SITE_OTHER): Payer: Medicare Other | Admitting: Ophthalmology

## 2020-06-13 ENCOUNTER — Other Ambulatory Visit: Payer: Self-pay

## 2020-06-13 DIAGNOSIS — H34811 Central retinal vein occlusion, right eye, with macular edema: Secondary | ICD-10-CM

## 2020-06-13 IMAGING — DX DG HIP (WITH OR WITHOUT PELVIS) 1V PORT*L*
3 series · 3 of 3 positions shown · non-contrast
Comparison: None.

CLINICAL DATA: Hip fracture

EXAM:
DG HIP (WITH OR WITHOUT PELVIS) 1V PORT LEFT

[pelvis ap]
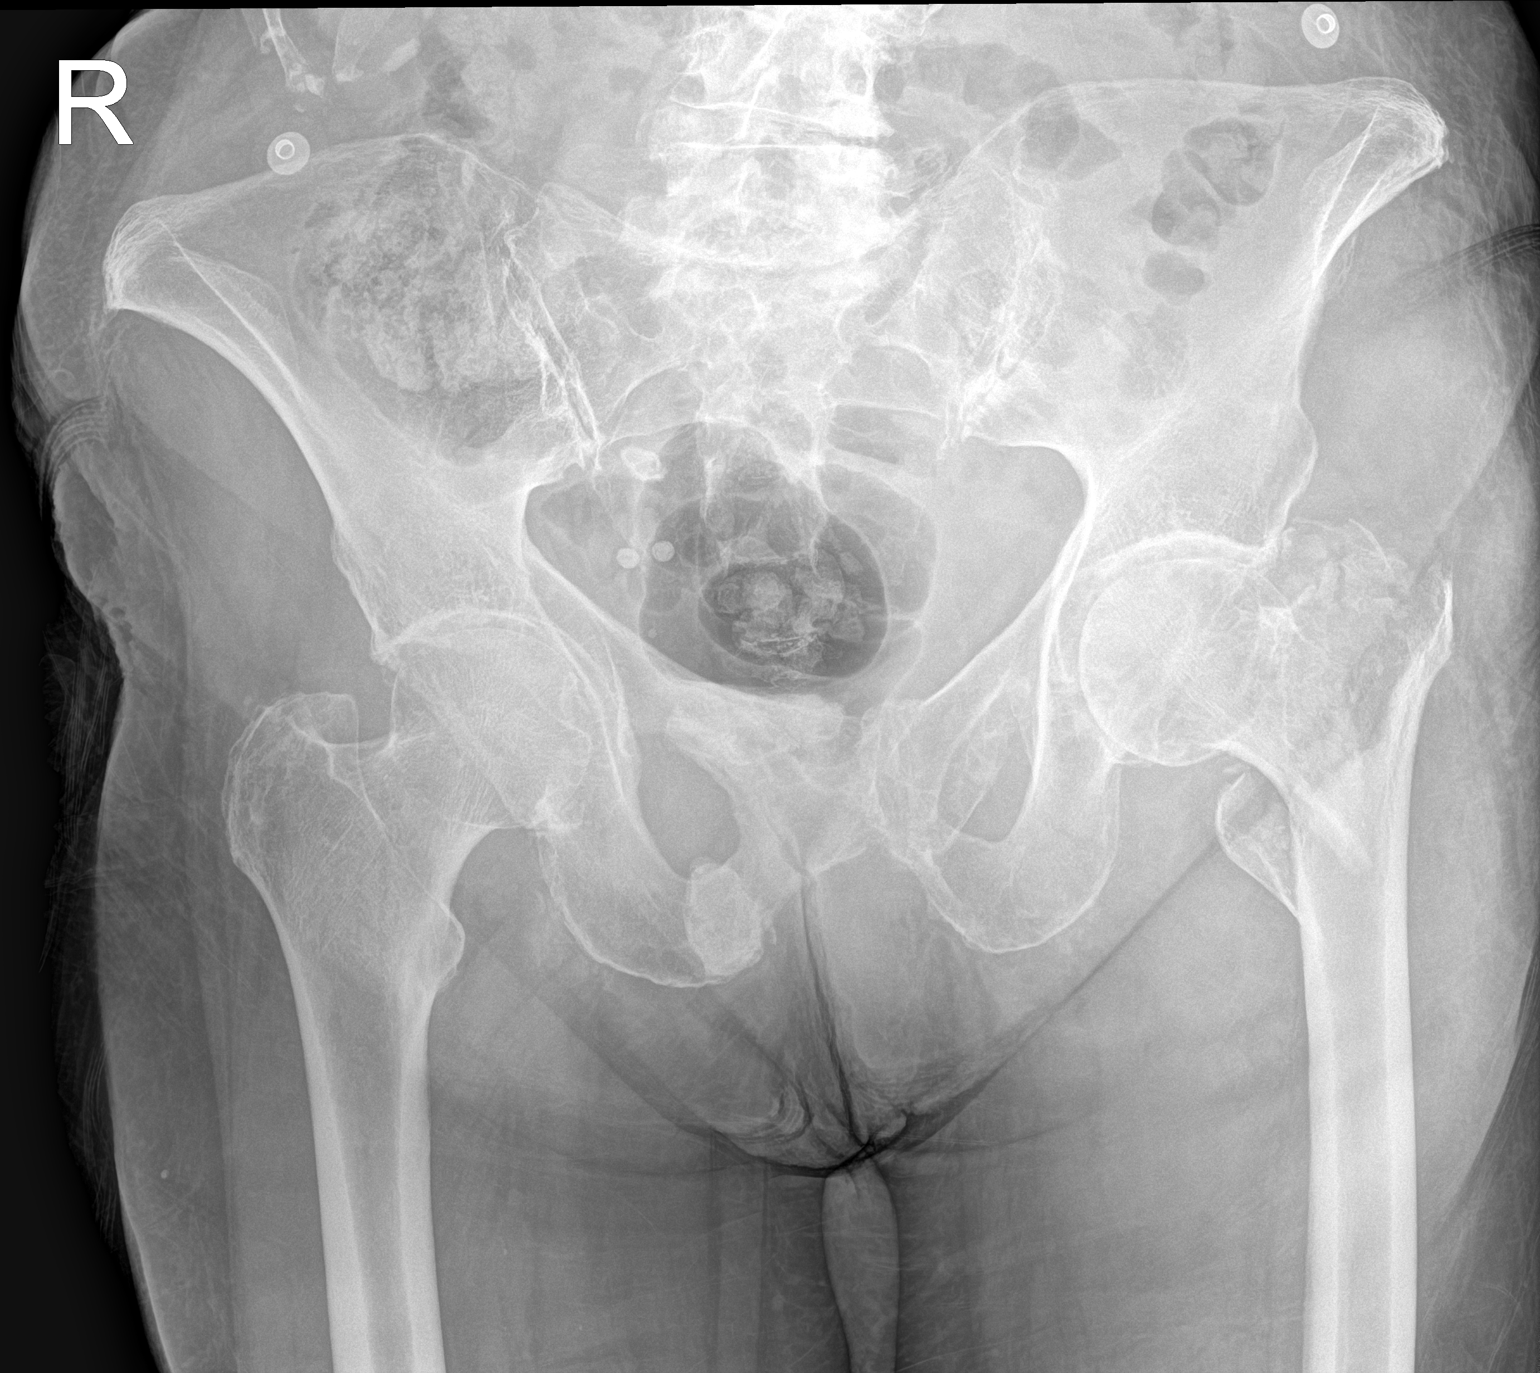

[hip ap]
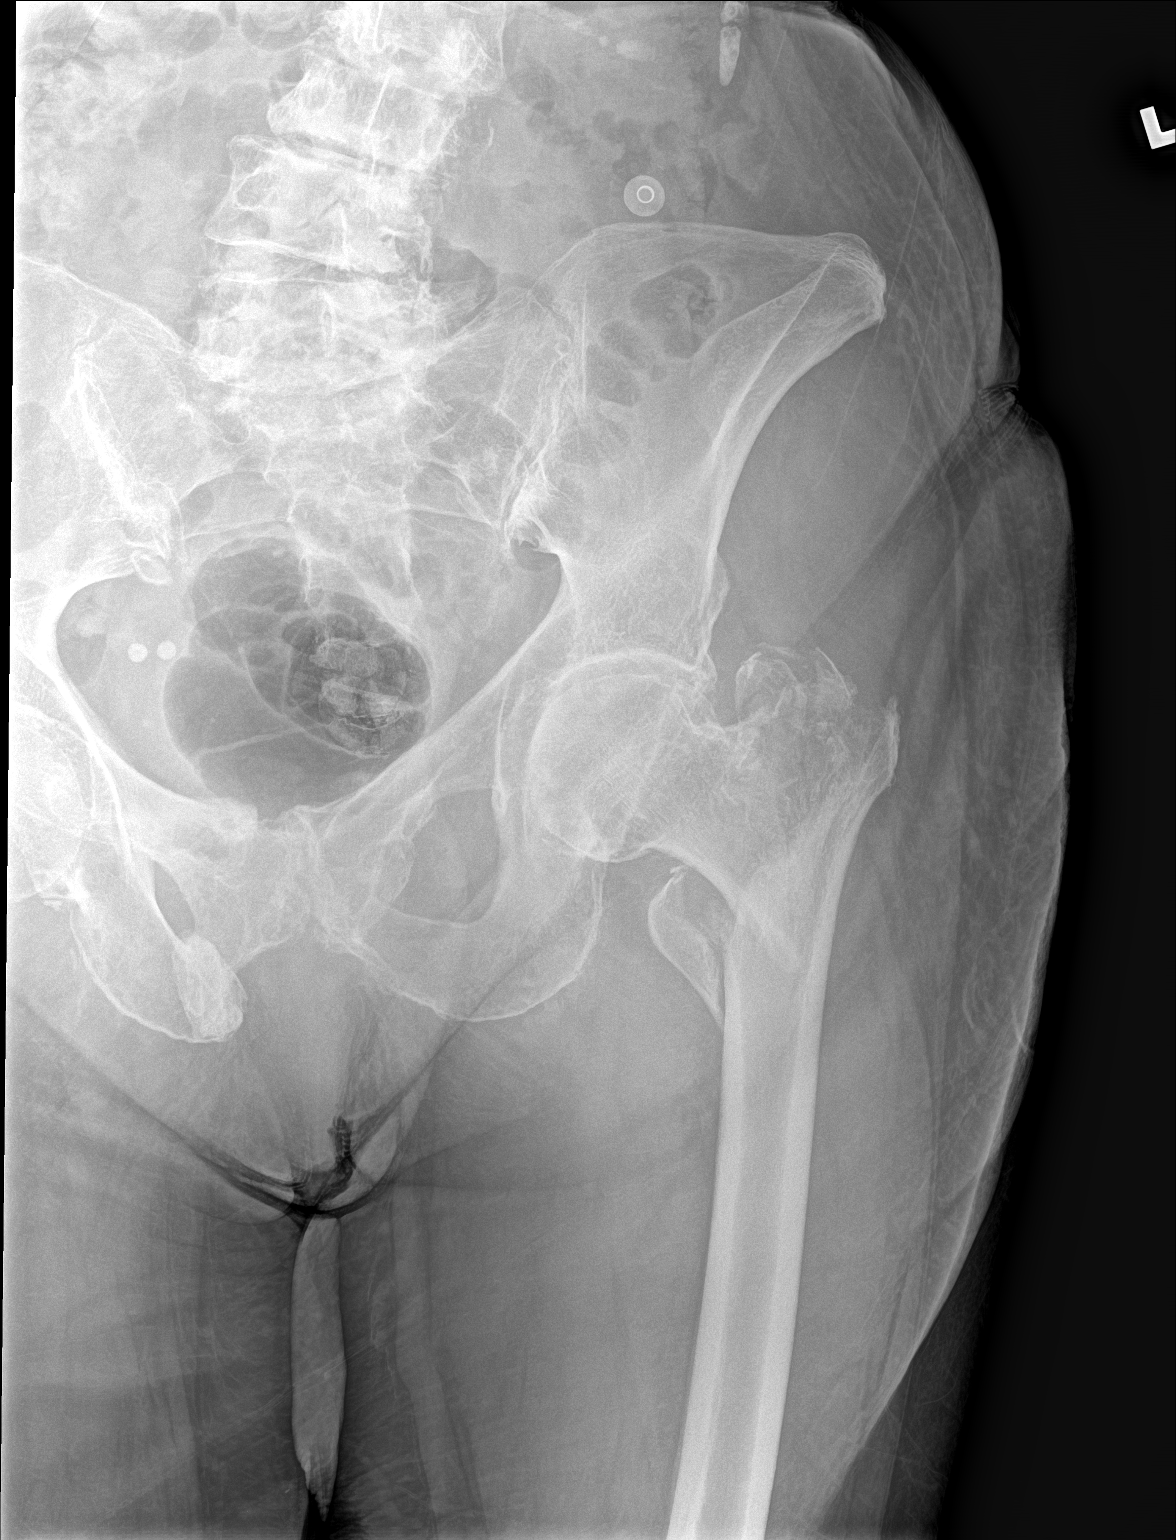

[hip lat]
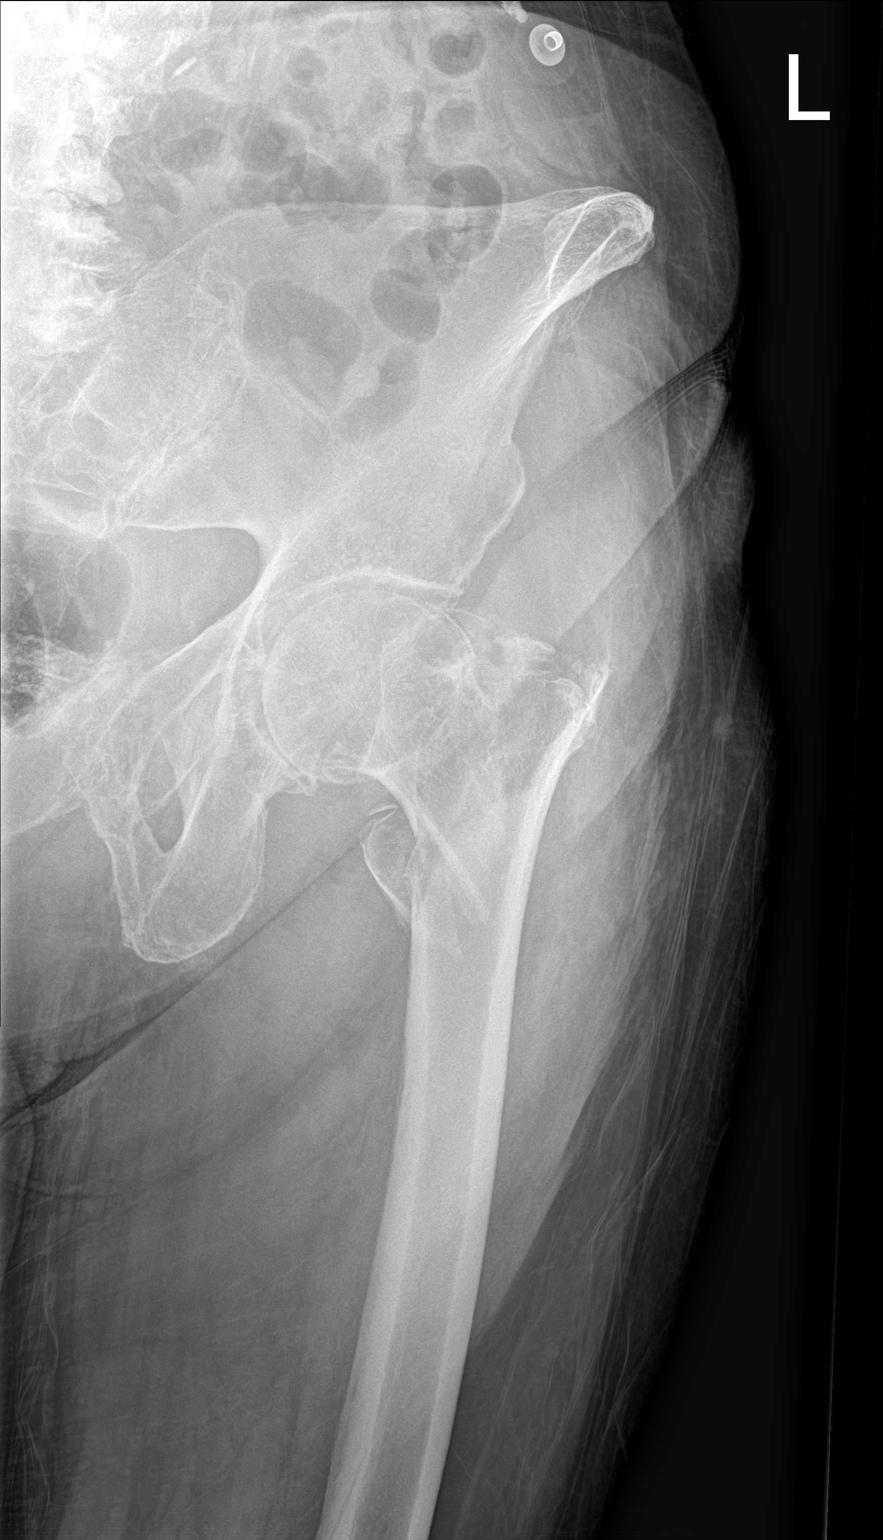

[3 of 3 positions shown; findings below may reference images not displayed]

FINDINGS: There is a comminuted impacted fracture of the left femoral head
neck junction which extends through the greater and lesser
trochanters. There is rotation at the femoral head on the
acetabulum, however it is still well seated within the acetabulum.
There is healing right superior and inferior pubic rami fractures.
There is cortical step-off seen at the left superior pubic rami,
likely nondisplaced fracture. Degenerative changes in the lower
lumbar spine.
IMPRESSION: Comminuted impacted left femoral head neck fracture extending
through the greater and lesser trochanters.

Cortical step-off at the superior left pubic rami, likely
nondisplaced fracture

Healing right superior and inferior pubic rami fractures.

## 2020-06-13 IMAGING — DX DG CHEST 1V PORT
1 series · 1 of 1 positions shown · non-contrast
Comparison: October 26, 2018

CLINICAL DATA: Broken left hip, preop

EXAM:
PORTABLE CHEST 1 VIEW

[chest ap]
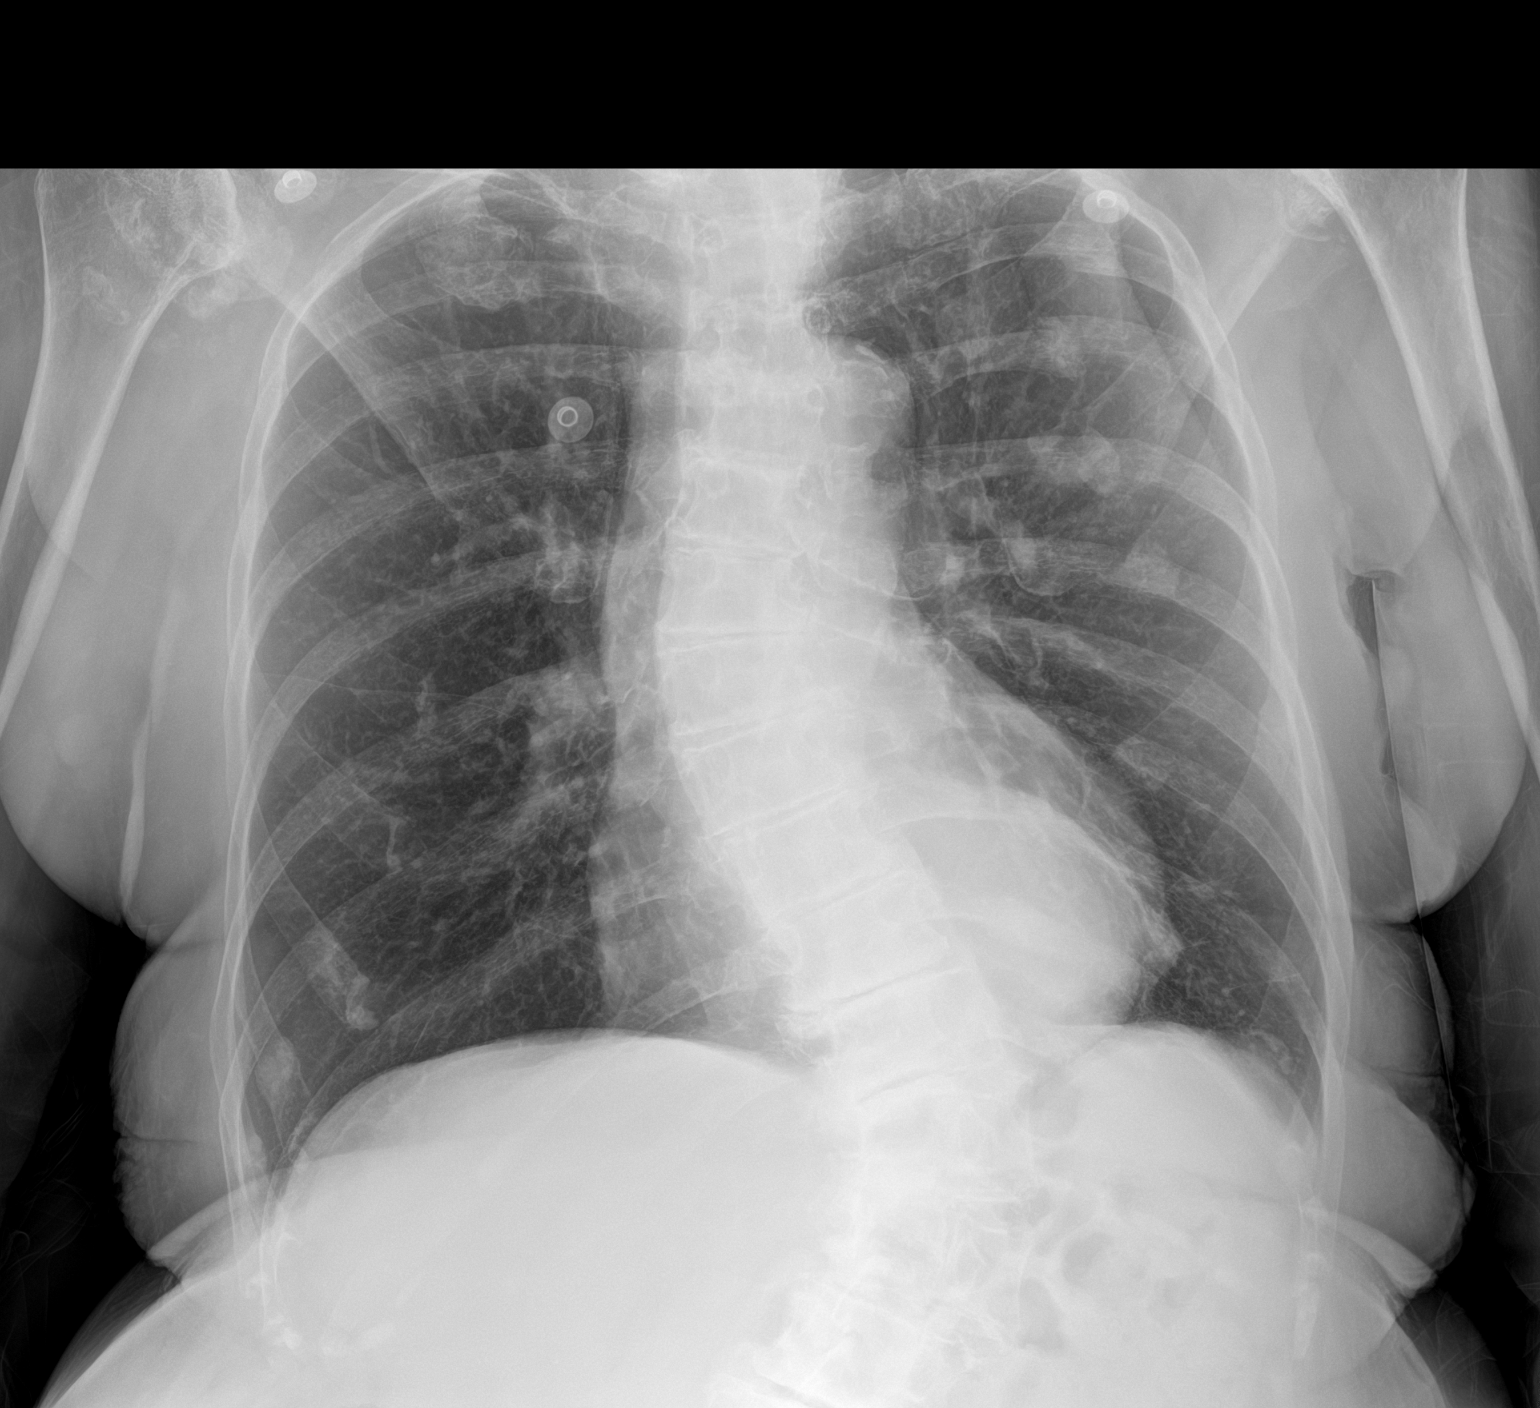

[1 of 1 positions shown; findings below may reference images not displayed]

FINDINGS: There is mild cardiomegaly. The lungs are clear. No pneumothorax or
pleural effusion. Healing posterior left third through sixth rib
fractures are seen. There is also healing lateral right seventh and
eighth rib fractures seen. Advanced bilateral shoulder arthropathy
seen. Again noted is flattening and cortical irregularity of the
left humeral head. S-shaped scoliotic curvature seen.
IMPRESSION: No acute cardiopulmonary process.

Healing bilateral rib fractures

## 2020-06-13 MED ORDER — BEVACIZUMAB CHEMO INJECTION 1.25MG/0.05ML SYRINGE FOR KALEIDOSCOPE
1.2500 mg | INTRAVITREAL | Status: AC | PRN
  Administered 2020-06-13: 1.25 mg via INTRAVITREAL

## 2020-06-13 NOTE — Assessment & Plan Note (Addendum)
CME minor and inferotemporal to the fovea OD not center involved being and stable at 9-week interval post intravitreal Avastin.  We will repeat today and examination in 9 weeks

## 2020-06-13 NOTE — Patient Instructions (Signed)
Patient instructed to contact the office for new onset vision distortion or losses

## 2020-06-13 NOTE — Progress Notes (Signed)
06/13/2020     CHIEF COMPLAINT Patient presents for Retina Follow Up   HISTORY OF PRESENT ILLNESS: Brenda Reynolds is a 86 y.o. female who presents to the clinic today for:   HPI    Retina Follow Up    Patient presents with  CRVO/BRVO.  In right eye.  Severity is moderate.  Duration of 9 weeks.  Since onset it is stable.  I, the attending physician,  performed the HPI with the patient and updated documentation appropriately.          Comments    9 Week CRVO f\u OD. Possible Avastin OD. OCT  Pt states she sees multiple floaters. Pt states she has trouble with fine print. Pt states no changes.       Last edited by Elyse Jarvis on 06/13/2020  1:30 PM. (History)      Referring physician: Toma Deiters, MD [address] [place],  [place] [address]  HISTORICAL INFORMATION:   Selected notes from the MEDICAL RECORD NUMBER       CURRENT MEDICATIONS: No current outpatient medications on file. (Ophthalmic Drugs)   No current facility-administered medications for this visit. (Ophthalmic Drugs)   Current Outpatient Medications (Other)  Medication Sig  . Ascorbic Acid (VITAMIN C) 100 MG tablet Take 100 mg by mouth daily.  Marland Kitchen acetaminophen (TYLENOL) 325 MG tablet Take 650 mg by mouth 3 (three) times daily.  Marland Kitchen alendronate (FOSAMAX) 70 MG tablet Take 70 mg by mouth every Tuesday.  . calcium-vitamin D (OSCAL WITH D) 500-200 MG-UNIT tablet Take 1 tablet by mouth 3 (three) times daily.  Marland Kitchen enoxaparin (LOVENOX) 40 MG/0.4ML injection Inject 0.4 mLs (40 mg total) into the skin daily.  . famotidine (PEPCID) 40 MG tablet Take 20 mg by mouth daily.  . folic acid (FOLVITE) 400 MCG tablet Take 400 mcg by mouth daily.  Marland Kitchen gabapentin (NEURONTIN) 300 MG capsule Take 300 mg by mouth 3 (three) times daily.  Marland Kitchen glucosamine-chondroitin 500-400 MG tablet Take 1 tablet by mouth daily.  Marland Kitchen HYDROcodone-acetaminophen (NORCO/VICODIN) 5-325 MG tablet Take 1 tablet by mouth every 4 (four) hours as needed  for moderate pain (pain score 4-6). (Patient not taking: Reported on 06/13/2020)  . losartan (COZAAR) 100 MG tablet Take 100 mg by mouth daily.  . Omega-3 Fatty Acids (FISH OIL) 1000 MG CAPS Take 2,000 mg by mouth daily.  . vitamin B-12 (CYANOCOBALAMIN) 500 MCG tablet Take 500 mcg by mouth daily.   No current facility-administered medications for this visit. (Other)      REVIEW OF SYSTEMS:    ALLERGIES Allergies  Allergen Reactions  . Lidocaine Rash    Patch    PAST MEDICAL HISTORY Past Medical History:  Diagnosis Date  . GERD (gastroesophageal reflux disease)   . Hypertension   . Intertrochanteric fracture of left femur (HCC)   . Osteoporosis   . Scoliosis    Past Surgical History:  Procedure Laterality Date  . CATARACT EXTRACTION    . DILATION AND CURETTAGE OF UTERUS    . EYE SURGERY     Bilateral cataracts  . INTRAMEDULLARY (IM) NAIL INTERTROCHANTERIC Left 07/04/2019   Procedure: INTRAMEDULLARY (IM) NAIL INTERTROCHANTRIC;  Surgeon: Samson Frederic, MD;  Location: [facility] OR;  Service: Orthopedics;  Laterality: Left;  . TONSILLECTOMY      FAMILY HISTORY Family History  Problem Relation Age of Onset  . Cancer Mother     SOCIAL HISTORY Social History   Tobacco Use  . Smoking status:  Former Smoker  . Smokeless tobacco: Never Used  Vaping Use  . Vaping Use: Never used  Substance Use Topics  . Alcohol use: Yes    Alcohol/week: 1.0 standard drink    Types: 1 Glasses of wine per week  . Drug use: Yes    Types: Cocaine         OPHTHALMIC EXAM:  Base Eye Exam    Visual Acuity (Snellen - Linear)      Right Left   Dist cc 20/60 -2 20/50 -2   Dist ph cc 20/30 -2 20/25 +   Correction: Glasses       Tonometry (Tonopen, 1:36 PM)      Right Left   Pressure 11 12       Pupils      Dark Light Shape React APD   Right 4 3 Round Brisk None   Left 4 3 Round Brisk None       Visual Fields (Counting fingers)      Left Right    Full Full       Neuro/Psych     Oriented x3: Yes   Mood/Affect: Normal       Dilation    Right eye: 1.0% Mydriacyl, 2.5% Phenylephrine @ 1:36 PM        Slit Lamp and Fundus Exam    External Exam      Right Left   External Normal Normal       Slit Lamp Exam      Right Left   Lids/Lashes Normal Normal   Conjunctiva/Sclera White and quiet White and quiet   Cornea Clear Clear   Anterior Chamber Deep and quiet Deep and quiet   Iris Round and reactive Round and reactive   Lens Posterior chamber intraocular lens Posterior chamber intraocular lens   Anterior Vitreous Normal Normal       Fundus Exam      Right Left   Posterior Vitreous ,, Central vitreous floaters    Disc Collaterals on the nerve, no NVD    C/D Ratio 0.25    Macula Macular atrophy,, Cystoid macular edema, Macular thickening, Microaneurysms    Vessels Normal    Periphery Normal           IMAGING AND PROCEDURES  Imaging and Procedures for 06/13/20  OCT, Retina - OU - Both Eyes       Right Eye Quality was good. Scan locations included subfoveal. Central Foveal Thickness: 256. Progression has been stable. Findings include cystoid macular edema.   Left Eye Quality was good. Scan locations included subfoveal. Central Foveal Thickness: 250. Progression has been stable. Findings include normal foveal contour.   Notes Minor CME inferotemporal to the foveal vascular zone OD, improved at 9-week interval post intravitreal Avastin.  We will repeat today and maintain a 9 to 10-week evaluation frame work       Intravitreal Injection, Pharmacologic Agent - OD - Right Eye       Time Out 06/13/2020. 2:32 PM. Confirmed correct patient, procedure, site, and patient consented.   Anesthesia Topical anesthesia was used. Anesthetic medications included Akten 3.5%.   Procedure Preparation included Tobramycin 0.3%, 10% betadine to eyelids, 5% betadine to ocular surface. A 30 gauge needle was used.   Injection:  1.25 mg Bevacizumab (AVASTIN)  SOLN   NDC: [address]-9, Lot: [id number]   Route: Intravitreal, Site: Right Eye, Waste: 0 mg  Post-op Post injection exam found visual acuity of at least counting fingers. The patient tolerated  the procedure well. There were no complications. The patient received written and verbal post procedure care education. Post injection medications were not given.                 ASSESSMENT/PLAN:  Central retinal vein occlusion with macular edema of right eye CME minor and inferotemporal to the fovea OD not center involved being and stable at 9-week interval post intravitreal Avastin.  We will repeat today and examination in 9 weeks      ICD-10-CM   1. Central retinal vein occlusion with macular edema of right eye  H34.8110 OCT, Retina - OU - Both Eyes    Intravitreal Injection, Pharmacologic Agent - OD - Right Eye    Bevacizumab (AVASTIN) SOLN 1.25 mg    1.  Follow-up interval today at 9 weeks Deep, for CME from CRV O, repeat injection intravitreal Avastin today and examination in 9 weeks  2.  3.  Ophthalmic Meds Ordered this visit:  Meds ordered this encounter  Medications  . Bevacizumab (AVASTIN) SOLN 1.25 mg       Return in about 9 weeks (around 08/15/2020) for DILATE OU, AVASTIN OCT, OD.  Patient Instructions  Patient instructed to contact the office for new onset vision distortion or losses    Explained the diagnoses, plan, and follow up with the patient and they expressed understanding.  Patient expressed understanding of the importance of proper follow up care.   Alford Highland Eyvette Cordon M.D. Diseases & Surgery of the Retina and Vitreous [facility] 06/13/20     Abbreviations: M myopia (nearsighted); A astigmatism; H hyperopia (farsighted); P presbyopia; Mrx spectacle prescription;  CTL contact lenses; OD right eye; OS left eye; OU both eyes  XT exotropia; ET esotropia; PEK punctate epithelial keratitis; PEE punctate epithelial erosions; DES dry eye  syndrome; MGD meibomian gland dysfunction; ATs artificial tears; PFAT's preservative free artificial tears; NSC nuclear sclerotic cataract; PSC posterior subcapsular cataract; ERM epi-retinal membrane; PVD posterior vitreous detachment; RD retinal detachment; DM diabetes mellitus; DR diabetic retinopathy; NPDR non-proliferative diabetic retinopathy; PDR proliferative diabetic retinopathy; CSME clinically significant macular edema; DME diabetic macular edema; dbh dot blot hemorrhages; CWS cotton wool spot; POAG primary open angle glaucoma; C/D cup-to-disc ratio; HVF humphrey visual field; GVF goldmann visual field; OCT optical coherence tomography; IOP intraocular pressure; BRVO Branch retinal vein occlusion; CRVO central retinal vein occlusion; CRAO central retinal artery occlusion; BRAO branch retinal artery occlusion; RT retinal tear; SB scleral buckle; PPV pars plana vitrectomy; VH Vitreous hemorrhage; PRP panretinal laser photocoagulation; IVK intravitreal kenalog; VMT vitreomacular traction; MH Macular hole;  NVD neovascularization of the disc; NVE neovascularization elsewhere; AREDS age related eye disease study; ARMD age related macular degeneration; POAG primary open angle glaucoma; EBMD epithelial/anterior basement membrane dystrophy; ACIOL anterior chamber intraocular lens; IOL intraocular lens; PCIOL posterior chamber intraocular lens; Phaco/IOL phacoemulsification with intraocular lens placement; PRK photorefractive keratectomy; LASIK laser assisted in situ keratomileusis; HTN hypertension; DM diabetes mellitus; COPD chronic obstructive pulmonary disease

## 2020-06-14 IMAGING — DX DG PORTABLE PELVIS
1 series · 1 of 1 positions shown · non-contrast
Comparison: Left hip radiographs from 1 day prior

CLINICAL DATA: ORIF proximal femur fracture

EXAM:
PORTABLE PELVIS 1-2 VIEWS

[pelvis]
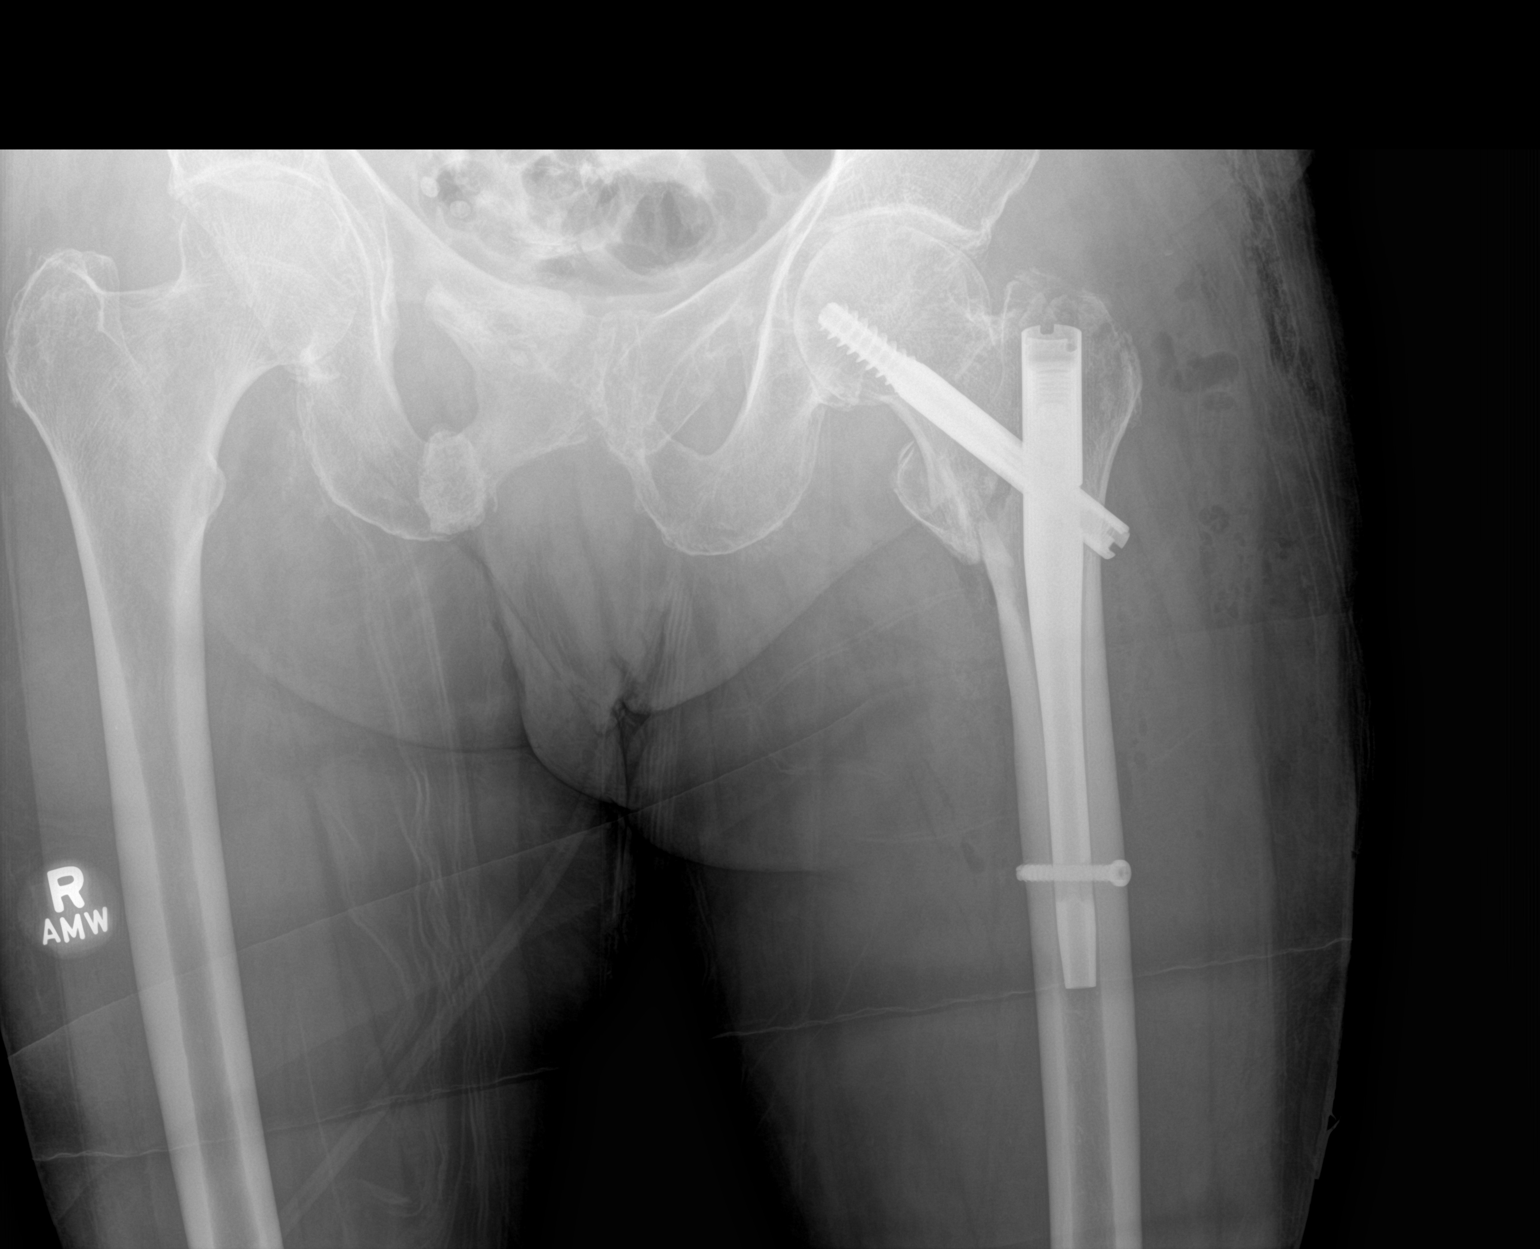

[1 of 1 positions shown; findings below may reference images not displayed]

FINDINGS: Near-anatomic alignment of comminuted intertrochanteric left
proximal femur fracture status post transfixation by intramedullary
rod with interlocking left femoral neck pin and distal interlocking
screw. Expected soft tissue gas surrounding the fracture site. No
additional acute osseous fracture. No evidence of hip dislocation on
this single frontal view. Healed deformities in the right superior
and inferior pubic rami. No suspicious focal osseous lesions.
IMPRESSION: Near-anatomic alignment of comminuted intertrochanteric left
proximal femur fracture status post ORIF.

## 2020-07-10 DIAGNOSIS — S72032D Displaced midcervical fracture of left femur, subsequent encounter for closed fracture with routine healing: Secondary | ICD-10-CM | POA: Diagnosis not present

## 2020-08-10 DIAGNOSIS — N182 Chronic kidney disease, stage 2 (mild): Secondary | ICD-10-CM | POA: Diagnosis not present

## 2020-08-10 DIAGNOSIS — J449 Chronic obstructive pulmonary disease, unspecified: Secondary | ICD-10-CM | POA: Diagnosis not present

## 2020-08-10 DIAGNOSIS — K2100 Gastro-esophageal reflux disease with esophagitis, without bleeding: Secondary | ICD-10-CM | POA: Diagnosis not present

## 2020-08-10 DIAGNOSIS — I10 Essential (primary) hypertension: Secondary | ICD-10-CM | POA: Diagnosis not present

## 2020-08-15 ENCOUNTER — Encounter (INDEPENDENT_AMBULATORY_CARE_PROVIDER_SITE_OTHER): Payer: Self-pay | Admitting: Ophthalmology

## 2020-08-15 ENCOUNTER — Ambulatory Visit (INDEPENDENT_AMBULATORY_CARE_PROVIDER_SITE_OTHER): Payer: Medicare Other | Admitting: Ophthalmology

## 2020-08-15 ENCOUNTER — Other Ambulatory Visit: Payer: Self-pay

## 2020-08-15 DIAGNOSIS — H353132 Nonexudative age-related macular degeneration, bilateral, intermediate dry stage: Secondary | ICD-10-CM | POA: Diagnosis not present

## 2020-08-15 DIAGNOSIS — H34811 Central retinal vein occlusion, right eye, with macular edema: Secondary | ICD-10-CM | POA: Diagnosis not present

## 2020-08-15 MED ORDER — BEVACIZUMAB CHEMO INJECTION 1.25MG/0.05ML SYRINGE FOR KALEIDOSCOPE
1.2500 mg | INTRAVITREAL | Status: AC | PRN
  Administered 2020-08-15: 1.25 mg via INTRAVITREAL

## 2020-08-15 NOTE — Assessment & Plan Note (Signed)
CRV OThe nature of central retinal vein occlusion was discussed with the patient including the division of types into nonischemic ischemic. The potential sequelae of ischemic central retinal vein occlusion, including macular edema, neovascularization, rubeosis iridis, and neovascular glaucoma, were discussed, and the need for frequent follow-up.  The nature of macular edema and central retinal vein occlusion was discussed. The following options were considered:  1.Observation for a period to look for spontaneous improvement, is no linger the primary therapy. One-third worsen, one-third stay unchanged, and one-third improves.  2. Anti-VEGF Therapy. ( Lucentis, Avastin or Eylea ) injected  in intravitreal fashion, initially monthly then tailored to clinical response.  3. Intravitreal steroid usage, Kenalog, or Ozurdex, usually a second line therapy or in combination with anti-Vegf therapy noted above.  4. Panretinal laser photocoagulation to cause regression of iris neovascularization, or treat retinal  non-perfusion.  5. Surgical Management may include vitrectomy with incisions of peripheral veins to trigger retino choroidal anastomosis formation. This topic presented and discussed at [facility] 2011.

## 2020-08-15 NOTE — Assessment & Plan Note (Signed)

## 2020-08-15 NOTE — Patient Instructions (Signed)
Patient instructed contact the office anytime visual acuity decline or distortion develops

## 2020-08-15 NOTE — Progress Notes (Signed)
08/15/2020     CHIEF COMPLAINT Patient presents for Retina Follow Up   HISTORY OF PRESENT ILLNESS: Brenda Reynolds is a 86 y.o. female who presents to the clinic today for:   HPI    Retina Follow Up    Patient presents with  CRVO/BRVO.  In right eye.  This started 9 weeks ago.  Severity is mild.  Duration of 9 weeks.  Since onset it is stable.          Comments    9 WK F/U OU, POSS AVASTIN OD  Stable vision, but today slight fogginess OD, no new F/F       Last edited by Varney Biles D on 08/15/2020  2:08 PM. (History)      Referring physician: Toma Deiters, MD [address] [place],  [place] [address]  HISTORICAL INFORMATION:   Selected notes from the MEDICAL RECORD NUMBER       CURRENT MEDICATIONS: No current outpatient medications on file. (Ophthalmic Drugs)   No current facility-administered medications for this visit. (Ophthalmic Drugs)   Current Outpatient Medications (Other)  Medication Sig  . acetaminophen (TYLENOL) 325 MG tablet Take 650 mg by mouth 3 (three) times daily.  Marland Kitchen alendronate (FOSAMAX) 70 MG tablet Take 70 mg by mouth every Tuesday.  . Ascorbic Acid (VITAMIN C) 100 MG tablet Take 100 mg by mouth daily.  . calcium-vitamin D (OSCAL WITH D) 500-200 MG-UNIT tablet Take 1 tablet by mouth 3 (three) times daily.  Marland Kitchen enoxaparin (LOVENOX) 40 MG/0.4ML injection Inject 0.4 mLs (40 mg total) into the skin daily.  . famotidine (PEPCID) 40 MG tablet Take 20 mg by mouth daily.  . folic acid (FOLVITE) 400 MCG tablet Take 400 mcg by mouth daily.  Marland Kitchen gabapentin (NEURONTIN) 300 MG capsule Take 300 mg by mouth 3 (three) times daily.  Marland Kitchen glucosamine-chondroitin 500-400 MG tablet Take 1 tablet by mouth daily.  Marland Kitchen losartan (COZAAR) 100 MG tablet Take 100 mg by mouth daily.  . Omega-3 Fatty Acids (FISH OIL) 1000 MG CAPS Take 2,000 mg by mouth daily.  . vitamin B-12 (CYANOCOBALAMIN) 500 MCG tablet Take 500 mcg by mouth daily.   No current facility-administered  medications for this visit. (Other)      REVIEW OF SYSTEMS:    ALLERGIES Allergies  Allergen Reactions  . Lidocaine Rash    Patch    PAST MEDICAL HISTORY Past Medical History:  Diagnosis Date  . GERD (gastroesophageal reflux disease)   . Hypertension   . Intertrochanteric fracture of left femur (HCC)   . Osteoporosis   . Scoliosis    Past Surgical History:  Procedure Laterality Date  . CATARACT EXTRACTION    . DILATION AND CURETTAGE OF UTERUS    . EYE SURGERY     Bilateral cataracts  . INTRAMEDULLARY (IM) NAIL INTERTROCHANTERIC Left 07/04/2019   Procedure: INTRAMEDULLARY (IM) NAIL INTERTROCHANTRIC;  Surgeon: Samson Frederic, MD;  Location: [facility] OR;  Service: Orthopedics;  Laterality: Left;  . TONSILLECTOMY      FAMILY HISTORY Family History  Problem Relation Age of Onset  . Cancer Mother     SOCIAL HISTORY Social History   Tobacco Use  . Smoking status: Former Games developer  . Smokeless tobacco: Never Used  Vaping Use  . Vaping Use: Never used  Substance Use Topics  . Alcohol use: Yes    Alcohol/week: 1.0 standard drink    Types: 1 Glasses of wine per week  . Drug use: Yes  Types: Cocaine         OPHTHALMIC EXAM:  Base Eye Exam    Visual Acuity (ETDRS)      Right Left   Dist cc 20/50 +1 20/30 -2   Dist ph cc 20/30 +2 NI   Correction: Glasses       Tonometry (Tonopen, 2:18 PM)      Right Left   Pressure 19 22       Pupils      Dark Light Shape React APD   Right 4 3 Round Brisk None   Left 4 3 Round Brisk None       Visual Fields (Counting fingers)      Left Right    Full Full       Extraocular Movement      Right Left    Full Full       Neuro/Psych    Oriented x3: Yes   Mood/Affect: Normal       Dilation    Right eye: 1.0% Mydriacyl, 2.5% Phenylephrine @ 2:18 PM        Slit Lamp and Fundus Exam    External Exam      Right Left   External Normal Normal       Slit Lamp Exam      Right Left   Lids/Lashes Normal Normal     Conjunctiva/Sclera White and quiet White and quiet   Cornea Clear Clear   Anterior Chamber Deep and quiet Deep and quiet   Iris Round and reactive Round and reactive   Lens Posterior chamber intraocular lens Posterior chamber intraocular lens   Anterior Vitreous Normal Normal       Fundus Exam      Right Left   Posterior Vitreous ,, Central vitreous floaters    Disc Collaterals on the nerve, no NVD    C/D Ratio 0.25    Macula Macular atrophy,, Cystoid macular edema, Macular thickening, Microaneurysms    Vessels Normal    Periphery Normal           IMAGING AND PROCEDURES  Imaging and Procedures for 08/15/20  OCT, Retina - OU - Both Eyes       Right Eye Quality was good. Scan locations included subfoveal. Central Foveal Thickness: 262. Progression has been stable. Findings include epiretinal membrane, cystoid macular edema, abnormal foveal contour.   Left Eye Quality was good. Scan locations included subfoveal. Central Foveal Thickness: 254. Progression has been stable. Findings include normal foveal contour, no SRF, no IRF.   Notes Some medial opacity OD, made with good CME improvement on intravitreal Avastin at 9-week interval today       Intravitreal Injection, Pharmacologic Agent - OD - Right Eye       Time Out 08/15/2020. 3:13 PM. Confirmed correct patient, procedure, site, and patient consented.   Anesthesia Topical anesthesia was used. Anesthetic medications included Akten 3.5%.   Procedure Preparation included Tobramycin 0.3%, 10% betadine to eyelids, 5% betadine to ocular surface. A 30 gauge needle was used.   Injection:  1.25 mg Bevacizumab (AVASTIN) SOLN   NDC: 70360-001-02, Lot: [id number]   Route: Intravitreal, Site: Right Eye, Waste: 0 mg  Post-op Post injection exam found visual acuity of at least counting fingers. The patient tolerated the procedure well. There were no complications. The patient received written and verbal post procedure care  education. Post injection medications were not given.  ASSESSMENT/PLAN:  Central retinal vein occlusion with macular edema of right eye CRV OThe nature of central retinal vein occlusion was discussed with the patient including the division of types into nonischemic ischemic. The potential sequelae of ischemic central retinal vein occlusion, including macular edema, neovascularization, rubeosis iridis, and neovascular glaucoma, were discussed, and the need for frequent follow-up.  The nature of macular edema and central retinal vein occlusion was discussed. The following options were considered:  1.Observation for a period to look for spontaneous improvement, is no linger the primary therapy. One-third worsen, one-third stay unchanged, and one-third improves.  2. Anti-VEGF Therapy. ( Lucentis, Avastin or Eylea ) injected  in intravitreal fashion, initially monthly then tailored to clinical response.  3. Intravitreal steroid usage, Kenalog, or Ozurdex, usually a second line therapy or in combination with anti-Vegf therapy noted above.  4. Panretinal laser photocoagulation to cause regression of iris neovascularization, or treat retinal  non-perfusion.  5. Surgical Management may include vitrectomy with incisions of peripheral veins to trigger retino choroidal anastomosis formation. This topic presented and discussed at [facility] 2011.   Intermediate stage nonexudative age-related macular degeneration of both eyes The nature of age--related macular degeneration was discussed with the patient as well as the distinction between dry and wet types. Checking an Amsler Grid daily with advice to return immediately should a distortion develop, was given to the patient. The patient 's smoking status now and in the past was determined and advice based on the AREDS study was provided regarding the consumption of antioxidant supplements. AREDS 2 vitamin formulation was recommended.  Consumption of dark leafy vegetables and fresh fruits of various colors was recommended. Treatment modalities for wet macular degeneration particularly the use of intravitreal injections of anti-blood vessel growth factors was discussed with the patient. Avastin, Lucentis, and Eylea are the available options. On occasion, therapy includes the use of photodynamic therapy and thermal laser. Stressed to the patient do not rub eyes.  Patient was advised to check Amsler Grid daily and return immediately if changes are noted. Instructions on using the grid were given to the patient. All patient questions were answered.      ICD-10-CM   1. Central retinal vein occlusion with macular edema of right eye  H34.8110 OCT, Retina - OU - Both Eyes    Intravitreal Injection, Pharmacologic Agent - OD - Right Eye    Bevacizumab (AVASTIN) SOLN 1.25 mg  2. Intermediate stage nonexudative age-related macular degeneration of both eyes  H35.3132     1.  We'll repeat injection intravitreal Avastin today to maintain less CME and prevent progression.  2.  3.  Ophthalmic Meds Ordered this visit:  Meds ordered this encounter  Medications  . Bevacizumab (AVASTIN) SOLN 1.25 mg       Return in about 9 weeks (around 10/17/2020) for dilate, OD, AVASTIN OCT.  Patient Instructions  Patient instructed contact the office anytime visual acuity decline or distortion develops    Explained the diagnoses, plan, and follow up with the patient and they expressed understanding.  Patient expressed understanding of the importance of proper follow up care.   Alford Highland Lexa Coronado M.D. Diseases & Surgery of the Retina and Vitreous [facility] 08/15/20     Abbreviations: M myopia (nearsighted); A astigmatism; H hyperopia (farsighted); P presbyopia; Mrx spectacle prescription;  CTL contact lenses; OD right eye; OS left eye; OU both eyes  XT exotropia; ET esotropia; PEK punctate epithelial keratitis; PEE punctate  epithelial erosions; DES dry eye  syndrome; MGD meibomian gland dysfunction; ATs artificial tears; PFAT's preservative free artificial tears; [place] nuclear sclerotic cataract; PSC posterior subcapsular cataract; ERM epi-retinal membrane; PVD posterior vitreous detachment; RD retinal detachment; DM diabetes mellitus; DR diabetic retinopathy; NPDR non-proliferative diabetic retinopathy; PDR proliferative diabetic retinopathy; CSME clinically significant macular edema; DME diabetic macular edema; dbh dot blot hemorrhages; CWS cotton wool spot; POAG primary open angle glaucoma; C/D cup-to-disc ratio; HVF humphrey visual field; GVF goldmann visual field; OCT optical coherence tomography; IOP intraocular pressure; BRVO Branch retinal vein occlusion; CRVO central retinal vein occlusion; CRAO central retinal artery occlusion; BRAO branch retinal artery occlusion; RT retinal tear; SB scleral buckle; PPV pars plana vitrectomy; VH Vitreous hemorrhage; PRP panretinal laser photocoagulation; IVK intravitreal kenalog; VMT vitreomacular traction; MH Macular hole;  NVD neovascularization of the disc; NVE neovascularization elsewhere; AREDS age related eye disease study; ARMD age related macular degeneration; POAG primary open angle glaucoma; EBMD epithelial/anterior basement membrane dystrophy; ACIOL anterior chamber intraocular lens; IOL intraocular lens; PCIOL posterior chamber intraocular lens; Phaco/IOL phacoemulsification with intraocular lens placement; [place] photorefractive keratectomy; LASIK laser assisted in situ keratomileusis; HTN hypertension; DM diabetes mellitus; COPD chronic obstructive pulmonary disease

## 2020-10-17 ENCOUNTER — Encounter (INDEPENDENT_AMBULATORY_CARE_PROVIDER_SITE_OTHER): Payer: Self-pay

## 2020-10-17 ENCOUNTER — Encounter (INDEPENDENT_AMBULATORY_CARE_PROVIDER_SITE_OTHER): Payer: Medicare Other | Admitting: Ophthalmology

## 2020-10-24 ENCOUNTER — Encounter (INDEPENDENT_AMBULATORY_CARE_PROVIDER_SITE_OTHER): Payer: Medicare Other | Admitting: Ophthalmology

## 2020-11-09 DIAGNOSIS — K2100 Gastro-esophageal reflux disease with esophagitis, without bleeding: Secondary | ICD-10-CM | POA: Diagnosis not present

## 2020-11-09 DIAGNOSIS — M25511 Pain in right shoulder: Secondary | ICD-10-CM | POA: Diagnosis not present

## 2020-11-09 DIAGNOSIS — J449 Chronic obstructive pulmonary disease, unspecified: Secondary | ICD-10-CM | POA: Diagnosis not present

## 2020-11-09 DIAGNOSIS — N182 Chronic kidney disease, stage 2 (mild): Secondary | ICD-10-CM | POA: Diagnosis not present

## 2020-11-09 DIAGNOSIS — I10 Essential (primary) hypertension: Secondary | ICD-10-CM | POA: Diagnosis not present

## 2020-11-13 ENCOUNTER — Encounter (INDEPENDENT_AMBULATORY_CARE_PROVIDER_SITE_OTHER): Payer: Self-pay | Admitting: *Deleted

## 2020-11-14 ENCOUNTER — Encounter (INDEPENDENT_AMBULATORY_CARE_PROVIDER_SITE_OTHER): Payer: Self-pay | Admitting: Ophthalmology

## 2020-11-14 ENCOUNTER — Other Ambulatory Visit: Payer: Self-pay

## 2020-11-14 ENCOUNTER — Ambulatory Visit (INDEPENDENT_AMBULATORY_CARE_PROVIDER_SITE_OTHER): Payer: Medicare Other | Admitting: Ophthalmology

## 2020-11-14 DIAGNOSIS — H353132 Nonexudative age-related macular degeneration, bilateral, intermediate dry stage: Secondary | ICD-10-CM

## 2020-11-14 DIAGNOSIS — H34811 Central retinal vein occlusion, right eye, with macular edema: Secondary | ICD-10-CM | POA: Diagnosis not present

## 2020-11-14 MED ORDER — BEVACIZUMAB 2.5 MG/0.1ML IZ SOSY
2.5000 mg | PREFILLED_SYRINGE | INTRAVITREAL | Status: AC | PRN
  Administered 2020-11-14: 2.5 mg via INTRAVITREAL

## 2020-11-14 NOTE — Assessment & Plan Note (Signed)
Plan return visit in 9 weeks has been stretched to 13 weeks due to transportation and weather issues as well.  CME has been recurrent and is massive at 13-week follow-up.  We will repeat injection antivegF today and examination again in 8 weeks

## 2020-11-14 NOTE — Assessment & Plan Note (Signed)
No signs of CNVM OS 

## 2020-11-14 NOTE — Progress Notes (Signed)
11/14/2020     CHIEF COMPLAINT Patient presents for Retina Follow Up (13 Week CRVO f\u OD. Possible Avastin OD. OCT/Pt states vision has been stable. Denies new complaints.)   HISTORY OF PRESENT ILLNESS: Brenda Reynolds is a 87 y.o. female who presents to the clinic today for:   HPI    Retina Follow Up    Patient presents with  CRVO/BRVO.  In right eye.  Severity is moderate.  Duration of 13 weeks.  Since onset it is stable.  I, the attending physician,  performed the HPI with the patient and updated documentation appropriately. Additional comments: 13 Week CRVO f\u OD. Possible Avastin OD. OCT Pt states vision has been stable. Denies new complaints.       Last edited by Elyse Jarvis on 11/14/2020  1:47 PM. (History)      Referring physician: Toma Deiters, MD [address] [place],  [place] [address]  HISTORICAL INFORMATION:   Selected notes from the MEDICAL RECORD NUMBER       CURRENT MEDICATIONS: No current outpatient medications on file. (Ophthalmic Drugs)   No current facility-administered medications for this visit. (Ophthalmic Drugs)   Current Outpatient Medications (Other)  Medication Sig  . acetaminophen (TYLENOL) 325 MG tablet Take 650 mg by mouth 3 (three) times daily.  Marland Kitchen alendronate (FOSAMAX) 70 MG tablet Take 70 mg by mouth every Tuesday.  . Ascorbic Acid (VITAMIN C) 100 MG tablet Take 100 mg by mouth daily.  . calcium-vitamin D (OSCAL WITH D) 500-200 MG-UNIT tablet Take 1 tablet by mouth 3 (three) times daily.  Marland Kitchen enoxaparin (LOVENOX) 40 MG/0.4ML injection Inject 0.4 mLs (40 mg total) into the skin daily.  . famotidine (PEPCID) 40 MG tablet Take 20 mg by mouth daily.  . folic acid (FOLVITE) 400 MCG tablet Take 400 mcg by mouth daily.  Marland Kitchen gabapentin (NEURONTIN) 300 MG capsule Take 300 mg by mouth 3 (three) times daily.  Marland Kitchen glucosamine-chondroitin 500-400 MG tablet Take 1 tablet by mouth daily.  Marland Kitchen losartan (COZAAR) 100 MG tablet Take 100 mg by mouth daily.   . Omega-3 Fatty Acids (FISH OIL) 1000 MG CAPS Take 2,000 mg by mouth daily.  . vitamin B-12 (CYANOCOBALAMIN) 500 MCG tablet Take 500 mcg by mouth daily.   No current facility-administered medications for this visit. (Other)      REVIEW OF SYSTEMS:    ALLERGIES Allergies  Allergen Reactions  . Lidocaine Rash    Patch    PAST MEDICAL HISTORY Past Medical History:  Diagnosis Date  . GERD (gastroesophageal reflux disease)   . Hypertension   . Intertrochanteric fracture of left femur (HCC)   . Osteoporosis   . Scoliosis    Past Surgical History:  Procedure Laterality Date  . CATARACT EXTRACTION    . DILATION AND CURETTAGE OF UTERUS    . EYE SURGERY     Bilateral cataracts  . INTRAMEDULLARY (IM) NAIL INTERTROCHANTERIC Left 07/04/2019   Procedure: INTRAMEDULLARY (IM) NAIL INTERTROCHANTRIC;  Surgeon: Samson Frederic, MD;  Location: [facility] OR;  Service: Orthopedics;  Laterality: Left;  . TONSILLECTOMY      FAMILY HISTORY Family History  Problem Relation Age of Onset  . Cancer Mother     SOCIAL HISTORY Social History   Tobacco Use  . Smoking status: Former Games developer  . Smokeless tobacco: Never Used  Vaping Use  . Vaping Use: Never used  Substance Use Topics  . Alcohol use: Yes    Alcohol/week: 1.0 standard drink  Types: 1 Glasses of wine per week  . Drug use: Yes    Types: Cocaine         OPHTHALMIC EXAM: Base Eye Exam    Visual Acuity (Snellen - Linear)      Right Left   Dist cc 20/80 -2 20/30   Dist ph cc NI    Correction: Glasses       Tonometry (Tonopen, 1:55 PM)      Right Left   Pressure 15 18       Pupils      Pupils Dark Light Shape React APD   Right PERRL 3 2 Round Brisk None   Left PERRL 3 2 Round Brisk None       Visual Fields (Counting fingers)      Left Right    Full Full       Neuro/Psych    Oriented x3: Yes   Mood/Affect: Normal       Dilation    Right eye: 1.0% Mydriacyl, 2.5% Phenylephrine @ 1:55 PM        Slit Lamp  and Fundus Exam    External Exam      Right Left   External Normal Normal       Slit Lamp Exam      Right Left   Lids/Lashes Normal Normal   Conjunctiva/Sclera White and quiet White and quiet   Cornea Clear Clear   Anterior Chamber Deep and quiet Deep and quiet   Iris Round and reactive Round and reactive   Lens Posterior chamber intraocular lens Posterior chamber intraocular lens   Anterior Vitreous Normal Normal       Fundus Exam      Right Left   Posterior Vitreous ,, Central vitreous floaters    Disc Collaterals on the nerve, no NVD    C/D Ratio 0.25    Macula Macular atrophy,, Cystoid macular edema, Macular thickening, Microaneurysms    Periphery Normal           IMAGING AND PROCEDURES  Imaging and Procedures for 11/14/20  OCT, Retina - OU - Both Eyes       Right Eye Quality was good. Scan locations included subfoveal. Central Foveal Thickness: 605. Progression has worsened. Findings include abnormal foveal contour, cystoid macular edema.   Left Eye Quality was good. Scan locations included subfoveal. Central Foveal Thickness: 258. Progression has been stable. Findings include normal foveal contour.   Notes Massive CME has recurred now at thirteen 1 week follow-up from central retinal vein occlusion will need repeat injection today, right eye       Intravitreal Injection, Pharmacologic Agent - OD - Right Eye       Time Out 11/14/2020. 2:51 PM. Confirmed correct patient, procedure, site, and patient consented.   Anesthesia Topical anesthesia was used. Anesthetic medications included Akten 3.5%.   Procedure Preparation included Tobramycin 0.3%, 10% betadine to eyelids, 5% betadine to ocular surface. A 30 gauge needle was used.   Injection:  2.5 mg Bevacizumab (AVASTIN) 2.5mg /0.24mL SOSY   NDC: 22979-892-11, Lot: [id number]   Route: Intravitreal, Site: Right Eye  Post-op Post injection exam found visual acuity of at least counting fingers. The patient  tolerated the procedure well. There were no complications. The patient received written and verbal post procedure care education. Post injection medications were not given.                 ASSESSMENT/PLAN:  Central retinal vein occlusion with macular edema of right  eye Plan return visit in 9 weeks has been stretched to 13 weeks due to transportation and weather issues as well.  CME has been recurrent and is massive at 13-week follow-up.  We will repeat injection antivegF today and examination again in 8 weeks      ICD-10-CM   1. Central retinal vein occlusion with macular edema of right eye  H34.8110 OCT, Retina - OU - Both Eyes    Intravitreal Injection, Pharmacologic Agent - OD - Right Eye    bevacizumab (AVASTIN) SOSY 2.5 mg    1.  Repeat intravitreal Avastin OD today, 13-week interval at this occurrence.  We will need to resume and maintain close to 8-week follow-up.  Goal is to preserve visual functioning 20/50 as acuity has declined to 20 80th with recurrent CME  2.  3.  Ophthalmic Meds Ordered this visit:  Meds ordered this encounter  Medications  . bevacizumab (AVASTIN) SOSY 2.5 mg       Return in about 8 weeks (around 01/09/2021) for DILATE OU, AVASTIN OCT, OD.  There are no Patient Instructions on file for this visit.   Explained the diagnoses, plan, and follow up with the patient and they expressed understanding.  Patient expressed understanding of the importance of proper follow up care.   Alford Highland Ardine Iacovelli M.D. Diseases & Surgery of the Retina and Vitreous [facility] 11/14/20     Abbreviations: M myopia (nearsighted); A astigmatism; H hyperopia (farsighted); P presbyopia; Mrx spectacle prescription;  CTL contact lenses; OD right eye; OS left eye; OU both eyes  XT exotropia; ET esotropia; PEK punctate epithelial keratitis; PEE punctate epithelial erosions; DES dry eye syndrome; MGD meibomian gland dysfunction; ATs artificial tears;  PFAT's preservative free artificial tears; NSC nuclear sclerotic cataract; PSC posterior subcapsular cataract; ERM epi-retinal membrane; PVD posterior vitreous detachment; RD retinal detachment; DM diabetes mellitus; DR diabetic retinopathy; NPDR non-proliferative diabetic retinopathy; PDR proliferative diabetic retinopathy; CSME clinically significant macular edema; DME diabetic macular edema; dbh dot blot hemorrhages; CWS cotton wool spot; POAG primary open angle glaucoma; C/D cup-to-disc ratio; HVF humphrey visual field; GVF goldmann visual field; OCT optical coherence tomography; IOP intraocular pressure; BRVO Branch retinal vein occlusion; CRVO central retinal vein occlusion; CRAO central retinal artery occlusion; BRAO branch retinal artery occlusion; RT retinal tear; SB scleral buckle; PPV pars plana vitrectomy; VH Vitreous hemorrhage; PRP panretinal laser photocoagulation; IVK intravitreal kenalog; VMT vitreomacular traction; MH Macular hole;  NVD neovascularization of the disc; NVE neovascularization elsewhere; AREDS age related eye disease study; ARMD age related macular degeneration; POAG primary open angle glaucoma; EBMD epithelial/anterior basement membrane dystrophy; ACIOL anterior chamber intraocular lens; IOL intraocular lens; PCIOL posterior chamber intraocular lens; Phaco/IOL phacoemulsification with intraocular lens placement; PRK photorefractive keratectomy; LASIK laser assisted in situ keratomileusis; HTN hypertension; DM diabetes mellitus; COPD chronic obstructive pulmonary disease

## 2020-12-26 DIAGNOSIS — Z780 Asymptomatic menopausal state: Secondary | ICD-10-CM | POA: Diagnosis not present

## 2020-12-26 DIAGNOSIS — M810 Age-related osteoporosis without current pathological fracture: Secondary | ICD-10-CM | POA: Diagnosis not present

## 2020-12-26 DIAGNOSIS — M85851 Other specified disorders of bone density and structure, right thigh: Secondary | ICD-10-CM | POA: Diagnosis not present

## 2021-01-09 ENCOUNTER — Other Ambulatory Visit: Payer: Self-pay

## 2021-01-09 ENCOUNTER — Encounter (INDEPENDENT_AMBULATORY_CARE_PROVIDER_SITE_OTHER): Payer: Self-pay | Admitting: Ophthalmology

## 2021-01-09 ENCOUNTER — Ambulatory Visit (INDEPENDENT_AMBULATORY_CARE_PROVIDER_SITE_OTHER): Payer: Medicare Other | Admitting: Ophthalmology

## 2021-01-09 DIAGNOSIS — H348122 Central retinal vein occlusion, left eye, stable: Secondary | ICD-10-CM | POA: Insufficient documentation

## 2021-01-09 DIAGNOSIS — H34811 Central retinal vein occlusion, right eye, with macular edema: Secondary | ICD-10-CM | POA: Diagnosis not present

## 2021-01-09 MED ORDER — BEVACIZUMAB 2.5 MG/0.1ML IZ SOSY
2.5000 mg | PREFILLED_SYRINGE | INTRAVITREAL | Status: AC | PRN
  Administered 2021-01-09: 2.5 mg via INTRAVITREAL

## 2021-01-09 NOTE — Assessment & Plan Note (Signed)
Observe OS °

## 2021-01-09 NOTE — Assessment & Plan Note (Signed)
Vastly improved CME secondary to CRV O at 8-week follow-up today post intravitreal Avastin.  Will need repeat injection today and examination again in 8 weeks

## 2021-01-09 NOTE — Progress Notes (Signed)
01/09/2021     CHIEF COMPLAINT Patient presents for Retina Follow Up (8 week fu OU. Possible Avastin OD/Pt states, "My VA was a little foggy yesterday.")   HISTORY OF PRESENT ILLNESS: Brenda Reynolds is a 87 y.o. female who presents to the clinic today for:   HPI    Retina Follow Up    Patient presents with  CRVO/BRVO.  In right eye.  This started 8 weeks ago.  Severity is mild.  Duration of 8 weeks.  Since onset it is gradually worsening. Additional comments: 8 week fu OU. Possible Avastin OD Pt states, "My VA was a little foggy yesterday."       Last edited by Demetrios Loll, COA on 01/09/2021  2:07 PM. (History)      Referring physician: Toma Deiters, MD [address] [place],  [place] [address]  HISTORICAL INFORMATION:   Selected notes from the MEDICAL RECORD NUMBER       CURRENT MEDICATIONS: No current outpatient medications on file. (Ophthalmic Drugs)   No current facility-administered medications for this visit. (Ophthalmic Drugs)   Current Outpatient Medications (Other)  Medication Sig  . acetaminophen (TYLENOL) 325 MG tablet Take 650 mg by mouth 3 (three) times daily.  Marland Kitchen alendronate (FOSAMAX) 70 MG tablet Take 70 mg by mouth every Tuesday.  . Ascorbic Acid (VITAMIN C) 100 MG tablet Take 100 mg by mouth daily.  . calcium-vitamin D (OSCAL WITH D) 500-200 MG-UNIT tablet Take 1 tablet by mouth 3 (three) times daily.  Marland Kitchen enoxaparin (LOVENOX) 40 MG/0.4ML injection Inject 0.4 mLs (40 mg total) into the skin daily.  . famotidine (PEPCID) 40 MG tablet Take 20 mg by mouth daily.  . folic acid (FOLVITE) 400 MCG tablet Take 400 mcg by mouth daily.  Marland Kitchen gabapentin (NEURONTIN) 300 MG capsule Take 300 mg by mouth 3 (three) times daily.  Marland Kitchen glucosamine-chondroitin 500-400 MG tablet Take 1 tablet by mouth daily.  Marland Kitchen losartan (COZAAR) 100 MG tablet Take 100 mg by mouth daily.  . Omega-3 Fatty Acids (FISH OIL) 1000 MG CAPS Take 2,000 mg by mouth daily.  . vitamin B-12  (CYANOCOBALAMIN) 500 MCG tablet Take 500 mcg by mouth daily.   No current facility-administered medications for this visit. (Other)      REVIEW OF SYSTEMS:    ALLERGIES Allergies  Allergen Reactions  . Lidocaine Rash    Patch    PAST MEDICAL HISTORY Past Medical History:  Diagnosis Date  . GERD (gastroesophageal reflux disease)   . Hypertension   . Intertrochanteric fracture of left femur (HCC)   . Osteoporosis   . Scoliosis    Past Surgical History:  Procedure Laterality Date  . CATARACT EXTRACTION    . DILATION AND CURETTAGE OF UTERUS    . EYE SURGERY     Bilateral cataracts  . INTRAMEDULLARY (IM) NAIL INTERTROCHANTERIC Left 07/04/2019   Procedure: INTRAMEDULLARY (IM) NAIL INTERTROCHANTRIC;  Surgeon: Samson Frederic, MD;  Location: [facility] OR;  Service: Orthopedics;  Laterality: Left;  . TONSILLECTOMY      FAMILY HISTORY Family History  Problem Relation Age of Onset  . Cancer Mother     SOCIAL HISTORY Social History   Tobacco Use  . Smoking status: Former Games developer  . Smokeless tobacco: Never Used  Vaping Use  . Vaping Use: Never used  Substance Use Topics  . Alcohol use: Yes    Alcohol/week: 1.0 standard drink    Types: 1 Glasses of wine per week  . Drug  use: Yes    Types: Cocaine         OPHTHALMIC EXAM:  Base Eye Exam    Visual Acuity (ETDRS)      Right Left   Dist cc 20/40 -2 20/30 -1   Dist ph cc NI    Correction: Glasses       Tonometry (Tonopen, 2:11 PM)      Right Left   Pressure 16 18       Pupils      Pupils Dark Light Shape React APD   Right PERRL 3 2 Round Brisk None   Left PERRL 3 2 Round Brisk None       Visual Fields (Counting fingers)      Left Right    Full Full       Extraocular Movement      Right Left    Full Full       Neuro/Psych    Oriented x3: Yes   Mood/Affect: Normal       Dilation    Both eyes: 1.0% Mydriacyl, 2.5% Phenylephrine @ 2:11 PM        Slit Lamp and Fundus Exam    External Exam       Right Left   External Normal Normal       Slit Lamp Exam      Right Left   Lids/Lashes Normal Normal   Conjunctiva/Sclera White and quiet White and quiet   Cornea Clear Clear   Anterior Chamber Deep and quiet Deep and quiet   Iris Round and reactive Round and reactive   Lens Posterior chamber intraocular lens Posterior chamber intraocular lens   Anterior Vitreous Normal Normal       Fundus Exam      Right Left   Posterior Vitreous ,, Central vitreous floaters Posterior vitreous detachment   Disc Collaterals on the nerve, no NVD Collaterals on the nerve, no NVD   C/D Ratio 0.25 0.2   Macula Macular atrophy,,  Microaneurysms Normal   Vessels Old CRVO, no N/V Normal   Periphery Normal Normal          IMAGING AND PROCEDURES  Imaging and Procedures for 01/09/21  OCT, Retina - OU - Both Eyes       Right Eye Quality was good. Scan locations included subfoveal. Central Foveal Thickness: 219. Progression has improved. Findings include abnormal foveal contour, cystoid macular edema.   Left Eye Quality was good. Scan locations included subfoveal. Central Foveal Thickness: 258. Progression has been stable. Findings include normal foveal contour.   Notes Vastly improved CME OD at 8-week follow-up post injection Avastin for central retinal vein occlusion induced CME.  We will repeat injection today in the right eye to maintain and extend interval examination again to 8 weeks       Intravitreal Injection, Pharmacologic Agent - OD - Right Eye       Time Out 01/09/2021. 2:38 PM. Confirmed correct patient, procedure, site, and patient consented.   Anesthesia Topical anesthesia was used. Anesthetic medications included Akten 3.5%.   Procedure Preparation included Tobramycin 0.3%, 10% betadine to eyelids, 5% betadine to ocular surface. A 30 gauge needle was used.   Injection:  2.5 mg Bevacizumab (AVASTIN) 2.5mg /0.55mL SOSY   NDC: 23762-831-51, Lot: [id number]   Route:  Intravitreal, Site: Right Eye  Post-op Post injection exam found visual acuity of at least counting fingers. The patient tolerated the procedure well. There were no complications. The patient received written and verbal post  procedure care education. Post injection medications were not given.                 ASSESSMENT/PLAN:  Central retinal vein occlusion with macular edema of right eye Vastly improved CME secondary to CRV O at 8-week follow-up today post intravitreal Avastin.  Will need repeat injection today and examination again in 8 weeks  Stable central retinal vein occlusion of left eye Observe OS      ICD-10-CM   1. Central retinal vein occlusion with macular edema of right eye  H34.8110 OCT, Retina - OU - Both Eyes    Intravitreal Injection, Pharmacologic Agent - OD - Right Eye    bevacizumab (AVASTIN) SOSY 2.5 mg  2. Stable central retinal vein occlusion of left eye  H34.8122     1.  Repeat injection intravitreal Avastin today and examination again in 8 weeks  2.  3.  Ophthalmic Meds Ordered this visit:  Meds ordered this encounter  Medications  . bevacizumab (AVASTIN) SOSY 2.5 mg       Return in about 8 weeks (around 03/06/2021) for dilate, OD, AVASTIN OCT.  There are no Patient Instructions on file for this visit.   Explained the diagnoses, plan, and follow up with the patient and they expressed understanding.  Patient expressed understanding of the importance of proper follow up care.   Alford Highland Evadean Sproule M.D. Diseases & Surgery of the Retina and Vitreous [facility] 01/09/21     Abbreviations: M myopia (nearsighted); A astigmatism; H hyperopia (farsighted); P presbyopia; Mrx spectacle prescription;  CTL contact lenses; OD right eye; OS left eye; OU both eyes  XT exotropia; ET esotropia; PEK punctate epithelial keratitis; PEE punctate epithelial erosions; DES dry eye syndrome; MGD meibomian gland dysfunction; ATs artificial tears;  PFAT's preservative free artificial tears; NSC nuclear sclerotic cataract; PSC posterior subcapsular cataract; ERM epi-retinal membrane; PVD posterior vitreous detachment; RD retinal detachment; DM diabetes mellitus; DR diabetic retinopathy; NPDR non-proliferative diabetic retinopathy; PDR proliferative diabetic retinopathy; CSME clinically significant macular edema; DME diabetic macular edema; dbh dot blot hemorrhages; CWS cotton wool spot; POAG primary open angle glaucoma; C/D cup-to-disc ratio; HVF humphrey visual field; GVF goldmann visual field; OCT optical coherence tomography; IOP intraocular pressure; BRVO Branch retinal vein occlusion; CRVO central retinal vein occlusion; CRAO central retinal artery occlusion; BRAO branch retinal artery occlusion; RT retinal tear; SB scleral buckle; PPV pars plana vitrectomy; VH Vitreous hemorrhage; PRP panretinal laser photocoagulation; IVK intravitreal kenalog; VMT vitreomacular traction; MH Macular hole;  NVD neovascularization of the disc; NVE neovascularization elsewhere; AREDS age related eye disease study; ARMD age related macular degeneration; POAG primary open angle glaucoma; EBMD epithelial/anterior basement membrane dystrophy; ACIOL anterior chamber intraocular lens; IOL intraocular lens; PCIOL posterior chamber intraocular lens; Phaco/IOL phacoemulsification with intraocular lens placement; PRK photorefractive keratectomy; LASIK laser assisted in situ keratomileusis; HTN hypertension; DM diabetes mellitus; COPD chronic obstructive pulmonary disease

## 2021-01-24 ENCOUNTER — Ambulatory Visit (INDEPENDENT_AMBULATORY_CARE_PROVIDER_SITE_OTHER): Payer: Medicare Other | Admitting: Gastroenterology

## 2021-02-08 DIAGNOSIS — I10 Essential (primary) hypertension: Secondary | ICD-10-CM | POA: Diagnosis not present

## 2021-02-08 DIAGNOSIS — K2100 Gastro-esophageal reflux disease with esophagitis, without bleeding: Secondary | ICD-10-CM | POA: Diagnosis not present

## 2021-02-08 DIAGNOSIS — R1310 Dysphagia, unspecified: Secondary | ICD-10-CM | POA: Diagnosis not present

## 2021-02-08 DIAGNOSIS — R197 Diarrhea, unspecified: Secondary | ICD-10-CM | POA: Diagnosis not present

## 2021-02-15 ENCOUNTER — Other Ambulatory Visit: Payer: Self-pay

## 2021-02-15 ENCOUNTER — Encounter (INDEPENDENT_AMBULATORY_CARE_PROVIDER_SITE_OTHER): Payer: Self-pay | Admitting: Gastroenterology

## 2021-02-15 ENCOUNTER — Other Ambulatory Visit (INDEPENDENT_AMBULATORY_CARE_PROVIDER_SITE_OTHER): Payer: Self-pay

## 2021-02-15 ENCOUNTER — Encounter (INDEPENDENT_AMBULATORY_CARE_PROVIDER_SITE_OTHER): Payer: Self-pay

## 2021-02-15 ENCOUNTER — Ambulatory Visit (INDEPENDENT_AMBULATORY_CARE_PROVIDER_SITE_OTHER): Payer: Medicare Other | Admitting: Gastroenterology

## 2021-02-15 ENCOUNTER — Telehealth (INDEPENDENT_AMBULATORY_CARE_PROVIDER_SITE_OTHER): Payer: Self-pay

## 2021-02-15 DIAGNOSIS — R1310 Dysphagia, unspecified: Secondary | ICD-10-CM | POA: Insufficient documentation

## 2021-02-15 DIAGNOSIS — K529 Noninfective gastroenteritis and colitis, unspecified: Secondary | ICD-10-CM

## 2021-02-15 DIAGNOSIS — R1319 Other dysphagia: Secondary | ICD-10-CM

## 2021-02-15 MED ORDER — PEG 3350-KCL-NA BICARB-NACL 420 G PO SOLR: 4000.0000 mL | ORAL | 0 refills | Status: DC

## 2021-02-15 NOTE — H&P (View-Only) (Signed)
Brenda Reynolds, M.D. Gastroenterology & Hepatology [facility] [address] [place], [place] [address] Primary Care Physician: Toma Deiters, MD [address] [place] [address]  Referring MD: PCP  Chief Complaint: Dysphagia  History of Present Illness: Brenda Reynolds is a 87 y.o. female with PMH GERD, HTN and osteoporosis, who presents for evaluation of dysphagia.  Patient reports that since January 2022 she noticed new onset dysphagia to both solids and liquids, but worse with solids. States that the dysphagia happens every 1-2 days - she states the food "stays stuck longer in her lower chest" and causes discomfort. This is followed by regurgitation of foam but no food.  Has not had any food impaction episodes.  Has not present any heartburn or odynophagia.  Patient also states that in February 2022 she had new onset of intermittent watery to soft diarrhea. She can have 3-4 BMs per day sometimes. It is concerning for her as sometimes she has to have to go to the restroom multiple times. She is a vegetarian but sometimes eats shrimp. She states she was initially started on an antidiarrheal without significant improvement. Due to this she was started on Colestid 1 g every 8 hours, Which she believes has improved her bowel movements. States more recently she has presented improvement in her bowel movements but has a bowel movement up to 3 times a day.  States she had an EGD in the past and had an esophageal dilation, but cannot recall exactly when.  The patient denies having any nausea, vomiting, fever, chills, hematochezia, melena, hematemesis, abdominal distention, abdominal pain, jaundice, pruritus or weight loss.  Takes famotidine for GERD.  Last Colonoscopy: possibly in 2006, no report available but reports she had 2 small polyps, performed at [facility] by Dr. Teena Dunk  FHx: neg for any gastrointestinal/liver disease,  mother pancreatic cancer Social: former smoking for 60 years quit in 2017, neg alcohol or illicit drug use Surgical: no abdominal surgeries  Past Medical History: Past Medical History:  Diagnosis Date  . GERD (gastroesophageal reflux disease)   . Hypertension   . Intertrochanteric fracture of left femur (HCC)   . Osteoporosis   . Scoliosis     Past Surgical History: Past Surgical History:  Procedure Laterality Date  . CATARACT EXTRACTION    . COLONOSCOPY     Per patient this was done by Dr. Teena Dunk  . DILATION AND CURETTAGE OF UTERUS    . EYE SURGERY     Bilateral cataracts  . INTRAMEDULLARY (IM) NAIL INTERTROCHANTERIC Left 07/04/2019   Procedure: INTRAMEDULLARY (IM) NAIL INTERTROCHANTRIC;  Surgeon: Samson Frederic, MD;  Location: [facility] OR;  Service: Orthopedics;  Laterality: Left;  . TONSILLECTOMY    . UPPER GASTROINTESTINAL ENDOSCOPY     Done by Dr. Gabriel Cirri    Family History: Family History  Problem Relation Age of Onset  . Cancer Mother     Social History: Social History   Tobacco Use  Smoking Status Former Smoker  Smokeless Tobacco Never Used   Social History   Substance and Sexual Activity  Alcohol Use Yes  . Alcohol/week: 1.0 standard drink  . Types: 1 Glasses of wine per week   Social History   Substance and Sexual Activity  Drug Use Yes  . Types: Cocaine    Allergies: Allergies  Allergen Reactions  . Lidocaine Rash    Patch    Medications: Current Outpatient Medications  Medication Sig Dispense Refill  . acetaminophen (TYLENOL)  325 MG tablet Take 650 mg by mouth 3 (three) times daily.    Marland Kitchen alendronate (FOSAMAX) 70 MG tablet Take 70 mg by mouth every Tuesday.    . Ascorbic Acid (VITAMIN C) 100 MG tablet Take 100 mg by mouth daily.    . Calcium Carbonate-Vitamin D3 (CALCIUM 600-D) 600-400 MG-UNIT TABS Take by mouth 3 (three) times daily.    . calcium-vitamin D (OSCAL WITH D) 500-200 MG-UNIT tablet Take 1 tablet by mouth 3 (three) times daily.     . colestipol (COLESTID) 1 g tablet Take 1 g by mouth 4 (four) times daily.    . famotidine (PEPCID) 40 MG tablet Take 20 mg by mouth daily.    . folic acid (FOLVITE) 400 MCG tablet Take 400 mcg by mouth daily.    Marland Kitchen gabapentin (NEURONTIN) 300 MG capsule Take 300 mg by mouth 3 (three) times daily.    Marland Kitchen glucosamine-chondroitin 500-400 MG tablet Take 1 tablet by mouth daily.    Marland Kitchen losartan (COZAAR) 100 MG tablet Take 100 mg by mouth daily.    . Omega-3 Fatty Acids (FISH OIL) 1000 MG CAPS Take 2,000 mg by mouth daily.    . vitamin B-12 (CYANOCOBALAMIN) 500 MCG tablet Take 500 mcg by mouth daily.    . vitamin E (VITAMIN E) 180 MG (400 UNITS) capsule Take 400 Units by mouth daily.    Marland Kitchen enoxaparin (LOVENOX) 40 MG/0.4ML injection Inject 0.4 mLs (40 mg total) into the skin daily. 12 mL 0   No current facility-administered medications for this visit.    Review of Systems: GENERAL: negative for malaise, night sweats HEENT: No changes in hearing or vision, no nose bleeds or other nasal problems. NECK: Negative for lumps, goiter, pain and significant neck swelling RESPIRATORY: Negative for cough, wheezing CARDIOVASCULAR: Negative for chest pain, leg swelling, palpitations, orthopnea GI: SEE HPI MUSCULOSKELETAL: Negative for joint pain or swelling, back pain, and muscle pain. SKIN: Negative for lesions, rash PSYCH: Negative for sleep disturbance, mood disorder and recent psychosocial stressors. HEMATOLOGY Negative for prolonged bleeding, bruising easily, and swollen nodes. ENDOCRINE: Negative for cold or heat intolerance, polyuria, polydipsia and goiter. NEURO: negative for tremor, gait imbalance, syncope and seizures. The remainder of the review of systems is noncontributory.   Physical Exam: BP (!) 148/78 (BP Location: Left Arm, Patient Position: Sitting, Cuff Size: Normal)   Pulse 73   Temp 98 F (36.7 C) (Oral)   Ht 4\' 11"  (1.499 m)   Wt 119 lb 1.6 oz (54 kg)   BMI 24.06 kg/m  GENERAL:  The patient is AO x3, in no acute distress. HEENT: Head is normocephalic and atraumatic. EOMI are intact. Mouth is well hydrated and without lesions. NECK: Supple. No masses LUNGS: Clear to auscultation. No presence of rhonchi/wheezing/rales. Adequate chest expansion HEART: RRR, normal s1 and s2. ABDOMEN: Soft, nontender, no guarding, no peritoneal signs, and nondistended. BS +. No masses. EXTREMITIES: Without any cyanosis, clubbing, rash, lesions or edema. NEUROLOGIC: AOx3, no focal motor deficit. SKIN: no jaundice, no rashes   Imaging/Labs: as above  I personally reviewed and interpreted the available labs, imaging and endoscopic files.  Impression and Plan: Beverely B Lodwick is a 87 y.o. female with PMH GERD, HTN and osteoporosis, who presents for evaluation of dysphagia.  The patient has presented recurrent episodes of dysphagia since early this year.  She had possibly at esophageal dilation in the past to resolve previous episode of dysphagia but has not presented this in a while.  Has  not presented any red flag signs.  We will proceed with an EGD with possible dilation, she can continue taking famotidine 40 mg every day for now but may need to switch her to a PPI if there is any presence of a peptic stricture.  Also, she has been some episodes of diarrhea of unclear etiology, will need to proceed with a colonoscopy obtain random colonic samples to rule out microscopic colitis.  As she has presented some improvement with the use of Colestid we will continue the same dosage for now.  -Schedule EGD with ED and colonoscopy -Continue Colestid 1 g every 8 hours  All questions were answered.      Brenda Blazing, MD Gastroenterology and Hepatology [facility]

## 2021-02-15 NOTE — Patient Instructions (Signed)
Schedule EGD with ED and colonoscopy Continue Colestid 1 g every 8 hours

## 2021-02-15 NOTE — Telephone Encounter (Signed)
LeighAnn Jordain Radin, CMA  

## 2021-02-15 NOTE — Progress Notes (Signed)
[name], M.D. Gastroenterology & Hepatology [facility] [address] [place], [place] [address] Primary Care Physician: Toma Deiters, MD [address] [place] [address]  Referring MD: PCP  Chief Complaint: Dysphagia  History of Present Illness: Brenda Reynolds is a 87 y.o. female with PMH GERD, HTN and osteoporosis, who presents for evaluation of dysphagia.  Patient reports that since January 2022 she noticed new onset dysphagia to both solids and liquids, but worse with solids. States that the dysphagia happens every 1-2 days - she states the food "stays stuck longer in her lower chest" and causes discomfort. This is followed by regurgitation of foam but no food.  Has not had any food impaction episodes.  Has not present any heartburn or odynophagia.  Patient also states that in February 2022 she had new onset of intermittent watery to soft diarrhea. She can have 3-4 BMs per day sometimes. It is concerning for her as sometimes she has to have to go to the restroom multiple times. She is a vegetarian but sometimes eats shrimp. She states she was initially started on an antidiarrheal without significant improvement. Due to this she was started on Colestid 1 g every 8 hours, Which she believes has improved her bowel movements. States more recently she has presented improvement in her bowel movements but has a bowel movement up to 3 times a day.  States she had an EGD in the past and had an esophageal dilation, but cannot recall exactly when.  The patient denies having any nausea, vomiting, fever, chills, hematochezia, melena, hematemesis, abdominal distention, abdominal pain, jaundice, pruritus or weight loss.  Takes famotidine for GERD.  Last Colonoscopy: possibly in 2006, no report available but reports she had 2 small polyps, performed at [facility] by Dr. Teena Dunk  FHx: neg for any gastrointestinal/liver disease,  mother pancreatic cancer Social: former smoking for 60 years quit in 2017, neg alcohol or illicit drug use Surgical: no abdominal surgeries  Past Medical History: Past Medical History:  Diagnosis Date  . GERD (gastroesophageal reflux disease)   . Hypertension   . Intertrochanteric fracture of left femur (HCC)   . Osteoporosis   . Scoliosis     Past Surgical History: Past Surgical History:  Procedure Laterality Date  . CATARACT EXTRACTION    . COLONOSCOPY     Per patient this was done by Dr. Teena Dunk  . DILATION AND CURETTAGE OF UTERUS    . EYE SURGERY     Bilateral cataracts  . INTRAMEDULLARY (IM) NAIL INTERTROCHANTERIC Left 07/04/2019   Procedure: INTRAMEDULLARY (IM) NAIL INTERTROCHANTRIC;  Surgeon: Samson Frederic, MD;  Location: [facility] OR;  Service: Orthopedics;  Laterality: Left;  . TONSILLECTOMY    . UPPER GASTROINTESTINAL ENDOSCOPY     Done by Dr. Gabriel Cirri    Family History: Family History  Problem Relation Age of Onset  . Cancer Mother     Social History: Social History   Tobacco Use  Smoking Status Former Smoker  Smokeless Tobacco Never Used   Social History   Substance and Sexual Activity  Alcohol Use Yes  . Alcohol/week: 1.0 standard drink  . Types: 1 Glasses of wine per week   Social History   Substance and Sexual Activity  Drug Use Yes  . Types: Cocaine    Allergies: Allergies  Allergen Reactions  . Lidocaine Rash    Patch    Medications: Current Outpatient Medications  Medication Sig Dispense Refill  . acetaminophen (TYLENOL)  325 MG tablet Take 650 mg by mouth 3 (three) times daily.    Marland Kitchen alendronate (FOSAMAX) 70 MG tablet Take 70 mg by mouth every Tuesday.    . Ascorbic Acid (VITAMIN C) 100 MG tablet Take 100 mg by mouth daily.    . Calcium Carbonate-Vitamin D3 (CALCIUM 600-D) 600-400 MG-UNIT TABS Take by mouth 3 (three) times daily.    . calcium-vitamin D (OSCAL WITH D) 500-200 MG-UNIT tablet Take 1 tablet by mouth 3 (three) times daily.     . colestipol (COLESTID) 1 g tablet Take 1 g by mouth 4 (four) times daily.    . famotidine (PEPCID) 40 MG tablet Take 20 mg by mouth daily.    . folic acid (FOLVITE) 400 MCG tablet Take 400 mcg by mouth daily.    Marland Kitchen gabapentin (NEURONTIN) 300 MG capsule Take 300 mg by mouth 3 (three) times daily.    Marland Kitchen glucosamine-chondroitin 500-400 MG tablet Take 1 tablet by mouth daily.    Marland Kitchen losartan (COZAAR) 100 MG tablet Take 100 mg by mouth daily.    . Omega-3 Fatty Acids (FISH OIL) 1000 MG CAPS Take 2,000 mg by mouth daily.    . vitamin B-12 (CYANOCOBALAMIN) 500 MCG tablet Take 500 mcg by mouth daily.    . vitamin E (VITAMIN E) 180 MG (400 UNITS) capsule Take 400 Units by mouth daily.    Marland Kitchen enoxaparin (LOVENOX) 40 MG/0.4ML injection Inject 0.4 mLs (40 mg total) into the skin daily. 12 mL 0   No current facility-administered medications for this visit.    Review of Systems: GENERAL: negative for malaise, night sweats HEENT: No changes in hearing or vision, no nose bleeds or other nasal problems. NECK: Negative for lumps, goiter, pain and significant neck swelling RESPIRATORY: Negative for cough, wheezing CARDIOVASCULAR: Negative for chest pain, leg swelling, palpitations, orthopnea GI: SEE HPI MUSCULOSKELETAL: Negative for joint pain or swelling, back pain, and muscle pain. SKIN: Negative for lesions, rash PSYCH: Negative for sleep disturbance, mood disorder and recent psychosocial stressors. HEMATOLOGY Negative for prolonged bleeding, bruising easily, and swollen nodes. ENDOCRINE: Negative for cold or heat intolerance, polyuria, polydipsia and goiter. NEURO: negative for tremor, gait imbalance, syncope and seizures. The remainder of the review of systems is noncontributory.   Physical Exam: BP (!) 148/78 (BP Location: Left Arm, Patient Position: Sitting, Cuff Size: Normal)   Pulse 73   Temp 98 F (36.7 C) (Oral)   Ht 4\' 11"  (1.499 m)   Wt 119 lb 1.6 oz (54 kg)   BMI 24.06 kg/m  GENERAL:  The patient is AO x3, in no acute distress. HEENT: Head is normocephalic and atraumatic. EOMI are intact. Mouth is well hydrated and without lesions. NECK: Supple. No masses LUNGS: Clear to auscultation. No presence of rhonchi/wheezing/rales. Adequate chest expansion HEART: RRR, normal s1 and s2. ABDOMEN: Soft, nontender, no guarding, no peritoneal signs, and nondistended. BS +. No masses. EXTREMITIES: Without any cyanosis, clubbing, rash, lesions or edema. NEUROLOGIC: AOx3, no focal motor deficit. SKIN: no jaundice, no rashes   Imaging/Labs: as above  I personally reviewed and interpreted the available labs, imaging and endoscopic files.  Impression and Plan: Brenda Reynolds is a 87 y.o. female with PMH GERD, HTN and osteoporosis, who presents for evaluation of dysphagia.  The patient has presented recurrent episodes of dysphagia since early this year.  She had possibly at esophageal dilation in the past to resolve previous episode of dysphagia but has not presented this in a while.  Has  not presented any red flag signs.  We will proceed with an EGD with possible dilation, she can continue taking famotidine 40 mg every day for now but may need to switch her to a PPI if there is any presence of a peptic stricture.  Also, she has been some episodes of diarrhea of unclear etiology, will need to proceed with a colonoscopy obtain random colonic samples to rule out microscopic colitis.  As she has presented some improvement with the use of Colestid we will continue the same dosage for now.  -Schedule EGD with ED and colonoscopy -Continue Colestid 1 g every 8 hours  All questions were answered.      [name], MD Gastroenterology and Hepatology [facility]

## 2021-03-05 NOTE — Patient Instructions (Signed)
Brenda Reynolds  03/05/2021     @PREFPERIOPPHARMACY @   Your procedure is scheduled on  03/09/2021.   Report to 05/09/2021 at  [phone]  A.M.   Call this number if you have problems the morning of surgery:  [phone]   Remember:  Follow the diet and prep instructions given to you by the office.                     Take these medicines the morning of surgery with A SIP OF WATER  Pepcid, gabapentin.    Please brush your teeth.  Do not wear jewelry, make-up or nail polish.  Do not wear lotions, powders, or perfumes, or deodorant.  Do not shave 48 hours prior to surgery.  Men may shave face and neck.  Do not bring valuables to the hospital.  [facility] is not responsible for any belongings or valuables.  Contacts, dentures or bridgework may not be worn into surgery.  Leave your suitcase in the car.  After surgery it may be brought to your room.  For patients admitted to the hospital, discharge time will be determined by your treatment team.  Patients discharged the day of surgery will not be allowed to drive home and must have someone with them for 24 hours.    Special instructions:  DO NOT smoke tobacco or vape for 24 hours before your procedure.  Please read over the following fact sheets that you were given. Anesthesia Post-op Instructions and Care and Recovery After Surgery       Upper Endoscopy, Adult, Care After This sheet gives you information about how to care for yourself after your procedure. Your health care provider may also give you more specific instructions. If you have problems or questions, contact your health care provider. What can I expect after the procedure? After the procedure, it is common to have:  A sore throat.  Mild stomach pain or discomfort.  Bloating.  Nausea. Follow these instructions at home:  Follow instructions from your health care provider about what to eat or drink after your procedure.  Return to your normal  activities as told by your health care provider. Ask your health care provider what activities are safe for you.  Take over-the-counter and prescription medicines only as told by your health care provider.  If you were given a sedative during the procedure, it can affect you for several hours. Do not drive or operate machinery until your health care provider says that it is safe.  Keep all follow-up visits as told by your health care provider. This is important.   Contact a health care provider if you have:  A sore throat that lasts longer than one day.  Trouble swallowing. Get help right away if:  You vomit blood or your vomit looks like coffee grounds.  You have: ? A fever. ? Bloody, black, or tarry stools. ? A severe sore throat or you cannot swallow. ? Difficulty breathing. ? Severe pain in your chest or abdomen. Summary  After the procedure, it is common to have a sore throat, mild stomach discomfort, bloating, and nausea.  If you were given a sedative during the procedure, it can affect you for several hours. Do not drive or operate machinery until your health care provider says that it is safe.  Follow instructions from your health care provider about what to eat or drink after your procedure.  Return  to your normal activities as told by your health care provider. This information is not intended to replace advice given to you by your health care provider. Make sure you discuss any questions you have with your health care provider. Document Revised: 09/14/2019 Document Reviewed: 02/16/2018 Elsevier Patient Education  2021 [employer]vier Inc[employer]  https://www.asge.org/home/for-patients/patient-information/understanding-eso-dilation-updated">  Esophageal Dilatation Esophageal dilatation, also called esophageal dilation, is a procedure to widen or open a blocked or narrowed part of the esophagus. The esophagus is the part of the body that moves food and liquid from the mouth to the  stomach. You may need this procedure if:  You have a buildup of scar tissue in your esophagus that makes it difficult, painful, or impossible to swallow. This can be caused by gastroesophageal reflux disease (GERD).  You have cancer of the esophagus.  There is a problem with how food moves through your esophagus. In some cases, you may need this procedure repeated at a later time to dilate the esophagus gradually. Tell a health care provider about:  Any allergies you have.  All medicines you are taking, including vitamins, herbs, eye drops, creams, and over-the-counter medicines.  Any problems you or family members have had with anesthetic medicines.  Any blood disorders you have.  Any surgeries you have had.  Any medical conditions you have.  Any antibiotic medicines you are required to take before dental procedures.  Whether you are pregnant or may be pregnant. What are the risks? Generally, this is a safe procedure. However, problems may occur, including:  Bleeding due to a tear in the lining of the esophagus.  A hole, or perforation, in the esophagus. What happens before the procedure?  Ask your health care provider about: ? Changing or stopping your regular medicines. This is especially important if you are taking diabetes medicines or blood thinners. ? Taking medicines such as aspirin and ibuprofen. These medicines can thin your blood. Do not take these medicines unless your health care provider tells you to take them. ? Taking over-the-counter medicines, vitamins, herbs, and supplements.  Follow instructions from your health care provider about eating or drinking restrictions.  Plan to have a responsible adult take you home from the hospital or clinic.  Plan to have a responsible adult care for you for the time you are told after you leave the hospital or clinic. This is important. What happens during the procedure?  You may be given a medicine to help you relax  (sedative).  A numbing medicine may be sprayed into the back of your throat, or you may gargle the medicine.  Your health care provider may perform the dilatation using various surgical instruments, such as: ? Simple dilators. This instrument is carefully placed in the esophagus to stretch it. ? Guided wire bougies. This involves using an endoscope to insert a wire into the esophagus. A dilator is passed over this wire to enlarge the esophagus. Then the wire is removed. ? Balloon dilators. An endoscope with a small balloon is inserted into the esophagus. The balloon is inflated to stretch the esophagus and open it up. The procedure may vary among health care providers and hospitals. What can I expect after the procedure?  Your blood pressure, heart rate, breathing rate, and blood oxygen level will be monitored until you leave the hospital or clinic.  Your throat may feel slightly sore and numb. This will get better over time.  You will not be allowed to eat or drink until your throat is no longer  numb.  When you are able to drink, urinate, and sit on the edge of the bed without nausea or dizziness, you may be able to return home. Follow these instructions at home:  Take over-the-counter and prescription medicines only as told by your health care provider.  If you were given a sedative during the procedure, it can affect you for several hours. Do not drive or operate machinery until your health care provider says that it is safe.  Plan to have a responsible adult care for you for the time you are told. This is important.  Follow instructions from your health care provider about any eating or drinking restrictions.  Do not use any products that contain nicotine or tobacco, such as cigarettes, e-cigarettes, and chewing tobacco. If you need help quitting, ask your health care provider.  Keep all follow-up visits. This is important. Contact a health care provider if:  You have a  fever.  You have pain that is not relieved by medicine. Get help right away if:  You have chest pain.  You have trouble breathing.  You have trouble swallowing.  You vomit blood.  You have black, tarry, or bloody stools. These symptoms may represent a serious problem that is an emergency. Do not wait to see if the symptoms will go away. Get medical help right away. Call your local emergency services (911 in the [place] Do not drive yourself to the hospital. Summary  Esophageal dilatation, also called esophageal dilation, is a procedure to widen or open a blocked or narrowed part of the esophagus.  Plan to have a responsible adult take you home from the hospital or clinic.  For this procedure, a numbing medicine may be sprayed into the back of your throat, or you may gargle the medicine.  Do not drive or operate machinery until your health care provider says that it is safe. This information is not intended to replace advice given to you by your health care provider. Make sure you discuss any questions you have with your health care provider. Document Revised: 02/02/2020 Document Reviewed: 02/02/2020 Elsevier Patient Education  2021 [employer].   Colonoscopy, Adult, Care After This sheet gives you information about how to care for yourself after your procedure. Your health care provider may also give you more specific instructions. If you have problems or questions, contact your health care provider. What can I expect after the procedure? After the procedure, it is common to have:  A small amount of blood in your stool for 24 hours after the procedure.  Some gas.  Mild cramping or bloating of your abdomen. Follow these instructions at home: Eating and drinking  Drink enough fluid to keep your urine pale yellow.  Follow instructions from your health care provider about eating or drinking restrictions.  Resume your normal diet as instructed by your health care provider.  Avoid heavy or fried foods that are hard to digest.   Activity  Rest as told by your health care provider.  Avoid sitting for a long time without moving. Get up to take short walks every 1-2 hours. This is important to improve blood flow and breathing. Ask for help if you feel weak or unsteady.  Return to your normal activities as told by your health care provider. Ask your health care provider what activities are safe for you. Managing cramping and bloating  Try walking around when you have cramps or feel bloated.  Apply heat to your abdomen as told by your health  care provider. Use the heat source that your health care provider recommends, such as a moist heat pack or a heating pad. ? Place a towel between your skin and the heat source. ? Leave the heat on for 20-30 minutes. ? Remove the heat if your skin turns bright red. This is especially important if you are unable to feel pain, heat, or cold. You may have a greater risk of getting burned.   General instructions  If you were given a sedative during the procedure, it can affect you for several hours. Do not drive or operate machinery until your health care provider says that it is safe.  For the first 24 hours after the procedure: ? Do not sign important documents. ? Do not drink alcohol. ? Do your regular daily activities at a slower pace than normal. ? Eat soft foods that are easy to digest.  Take over-the-counter and prescription medicines only as told by your health care provider.  Keep all follow-up visits as told by your health care provider. This is important. Contact a health care provider if:  You have blood in your stool 2-3 days after the procedure. Get help right away if you have:  More than a small spotting of blood in your stool.  Large blood clots in your stool.  Swelling of your abdomen.  Nausea or vomiting.  A fever.  Increasing pain in your abdomen that is not relieved with  medicine. Summary  After the procedure, it is common to have a small amount of blood in your stool. You may also have mild cramping and bloating of your abdomen.  If you were given a sedative during the procedure, it can affect you for several hours. Do not drive or operate machinery until your health care provider says that it is safe.  Get help right away if you have a lot of blood in your stool, nausea or vomiting, a fever, or increased pain in your abdomen. This information is not intended to replace advice given to you by your health care provider. Make sure you discuss any questions you have with your health care provider. Document Revised: 09/10/2019 Document Reviewed: 04/12/2019 Elsevier Patient Education  2021 [employer] Monitored Anesthesia Care, Care After This sheet gives you information about how to care for yourself after your procedure. Your health care provider may also give you more specific instructions. If you have problems or questions, contact your health care provider. What can I expect after the procedure? After the procedure, it is common to have:  Tiredness.  Forgetfulness about what happened after the procedure.  Impaired judgment for important decisions.  Nausea or vomiting.  Some difficulty with balance. Follow these instructions at home: For the time period you were told by your health care provider:  Rest as needed.  Do not participate in activities where you could fall or become injured.  Do not drive or use machinery.  Do not drink alcohol.  Do not take sleeping pills or medicines that cause drowsiness.  Do not make important decisions or sign legal documents.  Do not take care of children on your own.      Eating and drinking  Follow the diet that is recommended by your health care provider.  Drink enough fluid to keep your urine pale yellow.  If you vomit: ? Drink water, juice, or soup when you can drink without vomiting. ? Make  sure you have little or no nausea before eating solid foods. General instructions  Have a responsible adult stay with you for the time you are told. It is important to have someone help care for you until you are awake and alert.  Take over-the-counter and prescription medicines only as told by your health care provider.  If you have sleep apnea, surgery and certain medicines can increase your risk for breathing problems. Follow instructions from your health care provider about wearing your sleep device: ? Anytime you are sleeping, including during daytime naps. ? While taking prescription pain medicines, sleeping medicines, or medicines that make you drowsy.  Avoid smoking.  Keep all follow-up visits as told by your health care provider. This is important. Contact a health care provider if:  You keep feeling nauseous or you keep vomiting.  You feel light-headed.  You are still sleepy or having trouble with balance after 24 hours.  You develop a rash.  You have a fever.  You have redness or swelling around the IV site. Get help right away if:  You have trouble breathing.  You have new-onset confusion at home. Summary  For several hours after your procedure, you may feel tired. You may also be forgetful and have poor judgment.  Have a responsible adult stay with you for the time you are told. It is important to have someone help care for you until you are awake and alert.  Rest as told. Do not drive or operate machinery. Do not drink alcohol or take sleeping pills.  Get help right away if you have trouble breathing, or if you suddenly become confused. This information is not intended to replace advice given to you by your health care provider. Make sure you discuss any questions you have with your health care provider. Document Revised: 06/01/2020 Document Reviewed: 08/19/2019 Elsevier Patient Education  2021 [employer].

## 2021-03-06 ENCOUNTER — Encounter (INDEPENDENT_AMBULATORY_CARE_PROVIDER_SITE_OTHER): Payer: Self-pay | Admitting: Ophthalmology

## 2021-03-06 ENCOUNTER — Ambulatory Visit (INDEPENDENT_AMBULATORY_CARE_PROVIDER_SITE_OTHER): Payer: Medicare Other | Admitting: Ophthalmology

## 2021-03-06 ENCOUNTER — Other Ambulatory Visit: Payer: Self-pay

## 2021-03-06 DIAGNOSIS — H34811 Central retinal vein occlusion, right eye, with macular edema: Secondary | ICD-10-CM

## 2021-03-06 DIAGNOSIS — H353132 Nonexudative age-related macular degeneration, bilateral, intermediate dry stage: Secondary | ICD-10-CM

## 2021-03-06 MED ORDER — BEVACIZUMAB 2.5 MG/0.1ML IZ SOSY
2.5000 mg | PREFILLED_SYRINGE | INTRAVITREAL | Status: AC | PRN
  Administered 2021-03-06: 2.5 mg via INTRAVITREAL

## 2021-03-06 NOTE — Assessment & Plan Note (Signed)
No change, no CNVM OU, none by [facility]

## 2021-03-06 NOTE — Progress Notes (Signed)
03/06/2021     CHIEF COMPLAINT Patient presents for Retina Follow Up (8 Wk F/U OD, poss Avastin OD//Pt sts she sees floaters off and on. Pt sts she is unsure which eye the floaters are in. No other changes to [facility] reported OU.)   HISTORY OF PRESENT ILLNESS: Zada B Sek is a 87 y.o. female who presents to the clinic today for:   HPI    Retina Follow Up    Diagnosis: CRVO/BRVO   Laterality: right eye   Onset: 8 weeks ago   Severity: mild   Duration: 8 weeks   Course: stable   Comments: 8 Wk F/U OD, poss Avastin OD  Pt sts she sees floaters off and on. Pt sts she is unsure which eye the floaters are in. No other changes to [place] reported OU.       Last edited by Ileana Roup, COA on 03/06/2021  1:41 PM. (History)      Referring physician: Toma Deiters, MD [address] [place],  [place] [address]  HISTORICAL INFORMATION:   Selected notes from the MEDICAL RECORD NUMBER       CURRENT MEDICATIONS: No current outpatient medications on file. (Ophthalmic Drugs)   No current facility-administered medications for this visit. (Ophthalmic Drugs)   Current Outpatient Medications (Other)  Medication Sig  . acetaminophen (TYLENOL) 500 MG tablet Take 500-1,000 mg by mouth every 6 (six) hours as needed for moderate pain.  Marland Kitchen alendronate (FOSAMAX) 70 MG tablet Take 70 mg by mouth every Tuesday.  . calcium-vitamin D (OSCAL WITH D) 500-200 MG-UNIT tablet Take 1 tablet by mouth 3 (three) times daily.  . colestipol (COLESTID) 1 g tablet Take 1 g by mouth in the morning, at noon, and at bedtime.  . famotidine (PEPCID) 20 MG tablet Take 20 mg by mouth daily.  . folic acid (FOLVITE) 400 MCG tablet Take 400 mcg by mouth daily.  Marland Kitchen gabapentin (NEURONTIN) 300 MG capsule Take 300 mg by mouth 3 (three) times daily.  Marland Kitchen glucosamine-chondroitin 500-400 MG tablet Take 1 tablet by mouth daily.  Marland Kitchen losartan (COZAAR) 100 MG tablet Take 100 mg by mouth daily.  . Omega-3 Fatty Acids (FISH OIL) 1000 MG  CAPS Take 2,000 mg by mouth daily.  . polyethylene glycol-electrolytes (TRILYTE) 420 g solution Take 4,000 mLs by mouth as directed.  . vitamin B-12 (CYANOCOBALAMIN) 500 MCG tablet Take 500 mcg by mouth daily.  . vitamin C (ASCORBIC ACID) 500 MG tablet Take 1 tablet (500 mg total) by mouth daily.   No current facility-administered medications for this visit. (Other)      REVIEW OF SYSTEMS:    ALLERGIES Allergies  Allergen Reactions  . Lidocaine Rash    Patch    PAST MEDICAL HISTORY Past Medical History:  Diagnosis Date  . GERD (gastroesophageal reflux disease)   . Hypertension   . Intertrochanteric fracture of left femur (HCC)   . Osteoporosis   . Scoliosis    Past Surgical History:  Procedure Laterality Date  . CATARACT EXTRACTION    . COLONOSCOPY     Per patient this was done by Dr. Teena Dunk  . DILATION AND CURETTAGE OF UTERUS    . EYE SURGERY     Bilateral cataracts  . INTRAMEDULLARY (IM) NAIL INTERTROCHANTERIC Left 07/04/2019   Procedure: INTRAMEDULLARY (IM) NAIL INTERTROCHANTRIC;  Surgeon: Samson Frederic, MD;  Location: [facility] OR;  Service: Orthopedics;  Laterality: Left;  . TONSILLECTOMY    . UPPER GASTROINTESTINAL ENDOSCOPY  Done by Dr. Gabriel Cirri    FAMILY HISTORY Family History  Problem Relation Age of Onset  . Cancer Mother     SOCIAL HISTORY Social History   Tobacco Use  . Smoking status: Former Games developer  . Smokeless tobacco: Never Used  Vaping Use  . Vaping Use: Never used  Substance Use Topics  . Alcohol use: Yes    Alcohol/week: 1.0 standard drink    Types: 1 Glasses of wine per week  . Drug use: Yes    Types: Cocaine         OPHTHALMIC EXAM:  Base Eye Exam    Visual Acuity (ETDRS)      Right Left   Dist cc 20/60 +2 20/40 -2   Dist ph cc NI NI   Correction: Glasses       Tonometry (Tonopen, 1:45 PM)      Right Left   Pressure 11 13       Pupils      Pupils Dark Light Shape React APD   Right PERRL 5 4 Round Brisk None   Left  PERRL 5 4 Round Brisk None       Visual Fields (Counting fingers)      Left Right    Full Full       Extraocular Movement      Right Left    Full Full       Neuro/Psych    Oriented x3: Yes   Mood/Affect: Normal       Dilation    Right eye: 1.0% Mydriacyl, 2.5% Phenylephrine @ 1:45 PM        Slit Lamp and Fundus Exam    External Exam      Right Left   External Normal Normal       Slit Lamp Exam      Right Left   Lids/Lashes Normal Normal   Conjunctiva/Sclera White and quiet White and quiet   Cornea Clear Clear   Anterior Chamber Deep and quiet Deep and quiet   Iris Round and reactive Round and reactive   Lens Posterior chamber intraocular lens Posterior chamber intraocular lens   Anterior Vitreous Normal Normal       Fundus Exam      Right Left   Posterior Vitreous ,, Central vitreous floaters    Disc Collaterals on the nerve, no NVD    C/D Ratio 0.25    Macula Macular atrophy,,  Microaneurysms,    Vessels Old CRVO, no N/V    Periphery Normal           IMAGING AND PROCEDURES  Imaging and Procedures for 03/06/21  OCT, Retina - OU - Both Eyes       Right Eye Quality was good. Scan locations included subfoveal. Central Foveal Thickness: 232. Progression has been stable. Findings include abnormal foveal contour, cystoid macular edema.   Left Eye Quality was good. Scan locations included subfoveal. Central Foveal Thickness: 259. Progression has been stable. Findings include normal foveal contour.   Notes Vastly improved CME OD at 8-week follow-up post injection Avastin for central retinal vein occlusion induced CME.  We will repeat injection today in the right eye to maintain and extend interval examination again to 8 weeks       Intravitreal Injection, Pharmacologic Agent - OD - Right Eye       Time Out 03/06/2021. 2:43 PM. Confirmed correct patient, procedure, site, and patient consented.   Anesthesia Topical anesthesia was used. Anesthetic  medications included  [place] 3.5%.   Procedure Preparation included Tobramycin 0.3%, 10% betadine to eyelids, 5% betadine to ocular surface. A 30 gauge needle was used.   Injection:  2.5 mg Bevacizumab (AVASTIN) 2.5mg /0.57mL SOSY   NDC: 25366-440-34, Lot: [id number]   Route: Intravitreal, Site: Right Eye  Post-op Post injection exam found visual acuity of at least counting fingers. The patient tolerated the procedure well. There were no complications. The patient received written and verbal post procedure care education. Post injection medications were not given.                 ASSESSMENT/PLAN:  Central retinal vein occlusion with macular edema of right eye At 8-week follow-up post Avastin OD today, CME is stabilized although still slightly active temporally from Hemi central retinal vein occlusion.  Repeat injection Avastin OD today and maintain 8-week follow-up and to maintain acuity  Intermediate stage nonexudative age-related macular degeneration of both eyes No change, no CNVM OU, none by OCT OS      ICD-10-CM   1. Central retinal vein occlusion with macular edema of right eye  H34.8110 OCT, Retina - OU - Both Eyes    Intravitreal Injection, Pharmacologic Agent - OD - Right Eye    bevacizumab (AVASTIN) SOSY 2.5 mg  2. Intermediate stage nonexudative age-related macular degeneration of both eyes  H35.3132     1.  OD will repeat injection intravitreal Avastin to maintain stability of chronic CME from Hemi central retinal vein occlusion  2.  Dilate OU next  3.  Ophthalmic Meds Ordered this visit:  Meds ordered this encounter  Medications  . bevacizumab (AVASTIN) SOSY 2.5 mg       Return in about 8 weeks (around 05/01/2021) for DILATE OU, AVASTIN OCT, OD.  There are no Patient Instructions on file for this visit.   Explained the diagnoses, plan, and follow up with the patient and they expressed understanding.  Patient expressed understanding of the importance of  proper follow up care.   Alford Highland Dezmon Conover M.D. Diseases & Surgery of the Retina and Vitreous [facility] 03/06/21     Abbreviations: M myopia (nearsighted); A astigmatism; H hyperopia (farsighted); P presbyopia; Mrx spectacle prescription;  CTL contact lenses; OD right eye; OS left eye; OU both eyes  XT exotropia; ET esotropia; PEK punctate epithelial keratitis; PEE punctate epithelial erosions; DES dry eye syndrome; MGD meibomian gland dysfunction; ATs artificial tears; PFAT's preservative free artificial tears; NSC nuclear sclerotic cataract; PSC posterior subcapsular cataract; ERM epi-retinal membrane; PVD posterior vitreous detachment; RD retinal detachment; DM diabetes mellitus; DR diabetic retinopathy; NPDR non-proliferative diabetic retinopathy; PDR proliferative diabetic retinopathy; CSME clinically significant macular edema; DME diabetic macular edema; dbh dot blot hemorrhages; CWS cotton wool spot; POAG primary open angle glaucoma; C/D cup-to-disc ratio; HVF humphrey visual field; GVF goldmann visual field; OCT optical coherence tomography; IOP intraocular pressure; BRVO Branch retinal vein occlusion; CRVO central retinal vein occlusion; CRAO central retinal artery occlusion; BRAO branch retinal artery occlusion; RT retinal tear; SB scleral buckle; PPV pars plana vitrectomy; VH Vitreous hemorrhage; PRP panretinal laser photocoagulation; IVK intravitreal kenalog; VMT vitreomacular traction; MH Macular hole;  NVD neovascularization of the disc; NVE neovascularization elsewhere; AREDS age related eye disease study; ARMD age related macular degeneration; POAG primary open angle glaucoma; EBMD epithelial/anterior basement membrane dystrophy; ACIOL anterior chamber intraocular lens; IOL intraocular lens; PCIOL posterior chamber intraocular lens; Phaco/IOL phacoemulsification with intraocular lens placement; PRK photorefractive keratectomy; LASIK laser assisted in situ keratomileusis;  HTN hypertension; DM  diabetes mellitus; COPD chronic obstructive pulmonary disease

## 2021-03-06 NOTE — Assessment & Plan Note (Signed)
At 8-week follow-up post Avastin OD today, CME is stabilized although still slightly active temporally from Hemi central retinal vein occlusion.  Repeat injection Avastin OD today and maintain 8-week follow-up and to maintain acuity

## 2021-03-07 ENCOUNTER — Encounter (HOSPITAL_COMMUNITY): Payer: Self-pay

## 2021-03-07 ENCOUNTER — Encounter (HOSPITAL_COMMUNITY)
Admission: RE | Admit: 2021-03-07 | Discharge: 2021-03-07 | Disposition: A | Discharge status: Home / Self Care | Payer: Medicare Other | Source: Ambulatory Visit | Attending: Gastroenterology | Admitting: Gastroenterology

## 2021-03-07 ENCOUNTER — Other Ambulatory Visit (HOSPITAL_COMMUNITY): Admission: RE | Admit: 2021-03-07 | Payer: Medicare Other | Source: Ambulatory Visit

## 2021-03-07 DIAGNOSIS — Z01818 Encounter for other preprocedural examination: Secondary | ICD-10-CM | POA: Insufficient documentation

## 2021-03-07 LAB — CBC WITH DIFFERENTIAL/PLATELET
Abs Immature Granulocytes: 0.01 10*3/uL (ref 0.00–0.07)
Basophils Absolute: 0.1 10*3/uL (ref 0.0–0.1)
Basophils Relative: 1 %
Eosinophils Absolute: 0.2 10*3/uL (ref 0.0–0.5)
Eosinophils Relative: 3 %
HCT: 39.5 % (ref 36.0–46.0)
Hemoglobin: 12.4 g/dL (ref 12.0–15.0)
Immature Granulocytes: 0 %
Lymphocytes Relative: 19 %
Lymphs Abs: 1.2 10*3/uL (ref 0.7–4.0)
MCH: 30.4 pg (ref 26.0–34.0)
MCHC: 31.4 g/dL (ref 30.0–36.0)
MCV: 96.8 fL (ref 80.0–100.0)
Monocytes Absolute: 0.7 10*3/uL (ref 0.1–1.0)
Monocytes Relative: 11 %
Neutro Abs: 4 10*3/uL (ref 1.7–7.7)
Neutrophils Relative %: 66 %
Platelets: 199 10*3/uL (ref 150–400)
RBC: 4.08 MIL/uL (ref 3.87–5.11)
RDW: 14.4 % (ref 11.5–15.5)
WBC: 6.2 10*3/uL (ref 4.0–10.5)
nRBC: 0 % (ref 0.0–0.2)

## 2021-03-07 LAB — BASIC METABOLIC PANEL
Anion gap: 9 (ref 5–15)
BUN: 15 mg/dL (ref 8–23)
CO2: 23 mmol/L (ref 22–32)
Calcium: 9.3 mg/dL (ref 8.9–10.3)
Chloride: 103 mmol/L (ref 98–111)
Creatinine, Ser: 1.19 mg/dL — ABNORMAL HIGH (ref 0.44–1.00)
GFR, Estimated: 44 mL/min — ABNORMAL LOW (ref >60)
Glucose, Bld: 76 mg/dL (ref 70–99)
Potassium: 4.9 mmol/L (ref 3.5–5.1)
Sodium: 135 mmol/L (ref 135–145)

## 2021-03-09 ENCOUNTER — Ambulatory Visit (HOSPITAL_COMMUNITY)
Admission: RE | Admit: 2021-03-09 | Discharge: 2021-03-09 | Disposition: A | Discharge status: Home / Self Care | Payer: Medicare Other | Attending: Gastroenterology | Admitting: Gastroenterology

## 2021-03-09 ENCOUNTER — Ambulatory Visit (HOSPITAL_COMMUNITY): Payer: Medicare Other | Admitting: Anesthesiology

## 2021-03-09 ENCOUNTER — Other Ambulatory Visit: Payer: Self-pay

## 2021-03-09 ENCOUNTER — Encounter (HOSPITAL_COMMUNITY)
Admission: RE | Disposition: A | Discharge status: Home / Self Care | Payer: Self-pay | Source: Home / Self Care | Attending: Gastroenterology

## 2021-03-09 DIAGNOSIS — I10 Essential (primary) hypertension: Secondary | ICD-10-CM | POA: Insufficient documentation

## 2021-03-09 DIAGNOSIS — R1310 Dysphagia, unspecified: Secondary | ICD-10-CM | POA: Diagnosis not present

## 2021-03-09 DIAGNOSIS — K219 Gastro-esophageal reflux disease without esophagitis: Secondary | ICD-10-CM | POA: Diagnosis not present

## 2021-03-09 DIAGNOSIS — K529 Noninfective gastroenteritis and colitis, unspecified: Secondary | ICD-10-CM | POA: Insufficient documentation

## 2021-03-09 DIAGNOSIS — K2289 Other specified disease of esophagus: Secondary | ICD-10-CM | POA: Diagnosis not present

## 2021-03-09 DIAGNOSIS — K5710 Diverticulosis of small intestine without perforation or abscess without bleeding: Secondary | ICD-10-CM | POA: Diagnosis not present

## 2021-03-09 DIAGNOSIS — Z79899 Other long term (current) drug therapy: Secondary | ICD-10-CM | POA: Diagnosis not present

## 2021-03-09 DIAGNOSIS — M810 Age-related osteoporosis without current pathological fracture: Secondary | ICD-10-CM | POA: Insufficient documentation

## 2021-03-09 DIAGNOSIS — R197 Diarrhea, unspecified: Secondary | ICD-10-CM | POA: Diagnosis not present

## 2021-03-09 DIAGNOSIS — K5730 Diverticulosis of large intestine without perforation or abscess without bleeding: Secondary | ICD-10-CM | POA: Diagnosis not present

## 2021-03-09 DIAGNOSIS — K449 Diaphragmatic hernia without obstruction or gangrene: Secondary | ICD-10-CM | POA: Diagnosis not present

## 2021-03-09 DIAGNOSIS — K2950 Unspecified chronic gastritis without bleeding: Secondary | ICD-10-CM | POA: Diagnosis not present

## 2021-03-09 DIAGNOSIS — Z884 Allergy status to anesthetic agent status: Secondary | ICD-10-CM | POA: Diagnosis not present

## 2021-03-09 DIAGNOSIS — Z87891 Personal history of nicotine dependence: Secondary | ICD-10-CM | POA: Diagnosis not present

## 2021-03-09 DIAGNOSIS — Z7901 Long term (current) use of anticoagulants: Secondary | ICD-10-CM | POA: Insufficient documentation

## 2021-03-09 DIAGNOSIS — K259 Gastric ulcer, unspecified as acute or chronic, without hemorrhage or perforation: Secondary | ICD-10-CM | POA: Diagnosis not present

## 2021-03-09 HISTORY — PX: ESOPHAGEAL DILATION: SHX303

## 2021-03-09 HISTORY — PX: COLONOSCOPY WITH PROPOFOL: SHX5780

## 2021-03-09 HISTORY — PX: ESOPHAGOGASTRODUODENOSCOPY (EGD) WITH PROPOFOL: SHX5813

## 2021-03-09 HISTORY — PX: BIOPSY: SHX5522

## 2021-03-09 SURGERY — COLONOSCOPY WITH PROPOFOL
Anesthesia: General

## 2021-03-09 MED ORDER — PROPOFOL 10 MG/ML IV BOLUS
INTRAVENOUS | Status: DC | PRN
  Administered 2021-03-09: 30 mg via INTRAVENOUS
  Administered 2021-03-09: 80 mg via INTRAVENOUS
  Administered 2021-03-09: 20 mg via INTRAVENOUS

## 2021-03-09 MED ORDER — PHENYLEPHRINE 40 MCG/ML (10ML) SYRINGE FOR IV PUSH (FOR BLOOD PRESSURE SUPPORT)
PREFILLED_SYRINGE | INTRAVENOUS | Status: AC
  Filled 2021-03-09: qty 20

## 2021-03-09 MED ORDER — VASOPRESSIN 20 UNIT/ML IV SOLN
INTRAVENOUS | Status: AC
  Filled 2021-03-09: qty 1

## 2021-03-09 MED ORDER — LIDOCAINE HCL (CARDIAC) PF 100 MG/5ML IV SOSY
PREFILLED_SYRINGE | INTRAVENOUS | Status: DC | PRN
  Administered 2021-03-09: 50 mg via INTRAVENOUS

## 2021-03-09 MED ORDER — LACTATED RINGERS IV SOLN: INTRAVENOUS | Status: DC

## 2021-03-09 MED ORDER — PROPOFOL 500 MG/50ML IV EMUL
INTRAVENOUS | Status: DC | PRN
  Administered 2021-03-09: 150 ug/kg/min via INTRAVENOUS

## 2021-03-09 MED ORDER — PHENYLEPHRINE 40 MCG/ML (10ML) SYRINGE FOR IV PUSH (FOR BLOOD PRESSURE SUPPORT)
PREFILLED_SYRINGE | INTRAVENOUS | Status: DC | PRN
  Administered 2021-03-09: 120 ug via INTRAVENOUS
  Administered 2021-03-09: 80 ug via INTRAVENOUS
  Administered 2021-03-09: 120 ug via INTRAVENOUS
  Administered 2021-03-09 (×4): 80 ug via INTRAVENOUS

## 2021-03-09 NOTE — Anesthesia Postprocedure Evaluation (Signed)
Anesthesia Post Note  Patient: Octavie B Golberg  Procedure(s) Performed: COLONOSCOPY WITH PROPOFOL ESOPHAGOGASTRODUODENOSCOPY (EGD) WITH PROPOFOL ESOPHAGEAL DILATION BIOPSY  Patient location during evaluation: Phase II Anesthesia Type: General Level of consciousness: awake and alert and oriented Pain management: pain level controlled Vital Signs Assessment: post-procedure vital signs reviewed and stable Respiratory status: spontaneous breathing and respiratory function stable Cardiovascular status: blood pressure returned to baseline and stable Postop Assessment: no apparent nausea or vomiting Anesthetic complications: no   No notable events documented.   Last Vitals:  Vitals:   03/09/21 0904 03/09/21 1012  BP: (!) 159/80 112/80  Pulse:  78  Resp: 13 17  Temp: 36.8 C (!) 36.3 C  SpO2: 97% 99%    Last Pain:  Vitals:   03/09/21 1012  TempSrc: Axillary  PainSc: 0-No pain                 Kylar Leonhardt C Suan Pyeatt

## 2021-03-09 NOTE — Interval H&P Note (Signed)
History and Physical Interval Note:  03/09/2021 9:18 AM Brenda Reynolds is a 87 y.o. female with PMH GERD, HTN and osteoporosis, who presents for evaluation of dysphagia and diarrhea.  The patient reports that she has presented recurrent episodes of dysphagia with solid food.  Overall she feels that with the intake of PPI her symptoms improved and she has less episodes of heartburn.  She denies having any fever or chills.  No presence of odynophagia.  The patient had some episodes of diarrhea but she attributed this to her intake of vegetables, he has improved overall.  Denies any melena or hematochezia.  BP (!) 159/80   Temp 98.3 F (36.8 C) (Oral)   Resp 13   SpO2 97%  GENERAL: The patient is AO x3, in no acute distress. HEENT: Head is normocephalic and atraumatic. EOMI are intact. Mouth is well hydrated and without lesions. NECK: Supple. No masses LUNGS: Clear to auscultation. No presence of rhonchi/wheezing/rales. Adequate chest expansion HEART: RRR, normal s1 and s2. ABDOMEN: Soft, nontender, no guarding, no peritoneal signs, and nondistended. BS +. No masses. EXTREMITIES: Without any cyanosis, clubbing, rash, lesions or edema. NEUROLOGIC: AOx3, no focal motor deficit. SKIN: no jaundice, no rashes   Brenda Reynolds  has presented today for surgery, with the diagnosis of Dysphagia Chronic Diarrhea.  The various methods of treatment have been discussed with the patient and family. After consideration of risks, benefits and other options for treatment, the patient has consented to  Procedure(s) with comments: COLONOSCOPY WITH PROPOFOL (N/A) - 10:50 ESOPHAGOGASTRODUODENOSCOPY (EGD) WITH PROPOFOL (N/A) ESOPHAGEAL DILATION (N/A) as a surgical intervention.  The patient's history has been reviewed, patient examined, no change in status, stable for surgery.  I have reviewed the patient's chart and labs.  Questions were answered to the patient's satisfaction.     [name] May[place]a

## 2021-03-09 NOTE — Transfer of Care (Signed)
Immediate Anesthesia Transfer of Care Note  Patient: Brenda Reynolds  Procedure(s) Performed: COLONOSCOPY WITH PROPOFOL ESOPHAGOGASTRODUODENOSCOPY (EGD) WITH PROPOFOL ESOPHAGEAL DILATION BIOPSY  Patient Location: Short Stay  Anesthesia Type:General  Level of Consciousness: awake  Airway & Oxygen Therapy: Patient Spontanous Breathing  Post-op Assessment: Report given to RN and Post -op Vital signs reviewed and stable  Post vital signs: Reviewed and stable  Last Vitals:  Vitals Value Taken Time  BP    Temp    Pulse    Resp    SpO2      Last Pain:  Vitals:   03/09/21 0904  TempSrc: Oral  PainSc: 0-No pain      Patients Stated Pain Goal: 8 (03/09/21 0904)  Complications: No notable events documented.

## 2021-03-09 NOTE — Anesthesia Preprocedure Evaluation (Addendum)
Anesthesia Evaluation  Patient identified by MRN, date of birth, ID band Patient awake    Reviewed: Allergy & Precautions, NPO status , Patient's Chart, lab work & pertinent test results  History of Anesthesia Complications Negative for: history of anesthetic complications  Airway Mallampati: II  TM Distance: >3 FB Neck ROM: Full    Dental  (+) Dental Advisory Given, Upper Dentures   Pulmonary former smoker,    Pulmonary exam normal breath sounds clear to auscultation       Cardiovascular Exercise Tolerance: Good hypertension, Pt. on medications Normal cardiovascular exam Rhythm:Regular Rate:Normal     Neuro/Psych negative neurological ROS  negative psych ROS   GI/Hepatic Neg liver ROS, GERD  Medicated,  Endo/Other  negative endocrine ROS  Renal/GU negative Renal ROS     Musculoskeletal negative musculoskeletal ROS (+)   Abdominal   Peds  Hematology negative hematology ROS (+)   Anesthesia Other Findings   Reproductive/Obstetrics negative OB ROS                         Anesthesia Physical Anesthesia Plan  ASA: 2  Anesthesia Plan: General   Post-op Pain Management:    Induction: Intravenous  PONV Risk Score and Plan: Propofol infusion  Airway Management Planned: Nasal Cannula and Natural Airway  Additional Equipment:   Intra-op Plan:   Post-operative Plan:   Informed Consent: I have reviewed the patients History and Physical, chart, labs and discussed the procedure including the risks, benefits and alternatives for the proposed anesthesia with the patient or authorized representative who has indicated his/her understanding and acceptance.     Dental advisory given  Plan Discussed with: CRNA and Surgeon  Anesthesia Plan Comments:        Anesthesia Quick Evaluation

## 2021-03-09 NOTE — Op Note (Signed)
[facility] Patient Name: Brenda Reynolds Procedure Date: 03/09/2021 9:05 AM MRN: [id number] Date of Birth: [birth year] Attending MD: Katrinka Blazinganiel Castaneda ,  CSN: [id number] Age: [id number] Admit Type: Outpatient Procedure:                Upper GI endoscopy Indications:              Dysphagia Providers:                Katrinka Blazinganiel Castaneda, Jannett CelestineAnitra Bell, RN, Tammy Vaught,                            RN, Pandora LeiterNeville David, Technician, Cyril MourningJay Christopher                            Tech, Technician Referring MD:              Medicines:                Monitored Anesthesia Care Complications:            No immediate complications. Estimated Blood Loss:     Estimated blood loss: none. Procedure:                Pre-Anesthesia Assessment:                           - Prior to the procedure, a History and Physical                            was performed, and patient medications, allergies                            and sensitivities were reviewed. The patient's                            tolerance of previous anesthesia was reviewed.                           - The risks and benefits of the procedure and the                            sedation options and risks were discussed with the                            patient. All questions were answered and informed                            consent was obtained.                           - ASA Grade Assessment: III - A patient with severe                            systemic disease.                           After obtaining informed consent, the endoscope was  passed under direct vision. Throughout the                            procedure, the patient's blood pressure, pulse, and                            oxygen saturations were monitored continuously. The                            [id number]) was introduced through the mouth,                            and advanced to the second part of duodenum. The                            upper  GI endoscopy was accomplished without                            difficulty. The patient tolerated the procedure                            well. Scope In: 9:23:36 AM Scope Out: 9:35:59 AM Total Procedure Duration: 0 hours 12 minutes 23 seconds  Findings:      No endoscopic abnormality was evident in the esophagus to explain the       patient's complaint of dysphagia. The esophagus was tortuous but there       was no resistance to the passage of the scope. It was decided, however,       to proceed with dilation of the entire esophagus. A guidewire was placed       and the scope was withdrawn. Dilation was performed with a Savary       dilator with no resistance at 18 mm. No mucosal disruption was observed.      A 4 cm hiatal hernia was present.      Two non-bleeding superficial gastric ulcers with a clean ulcer base       (Forrest Class III) were found in the gastric antrum and at the pylorus.       The largest lesion was 5 mm in largest dimension. There were some few       associated erosions and scars. Biopsies were taken with a cold forceps       for Helicobacter pylori testing.      A medium non-bleeding diverticulum was found in the second portion of       the duodenum.      The examined duodenum was normal. Impression:               - No endoscopic esophageal abnormality to explain                            patient's dysphagia. Esophagus dilated. Dilated.                           - 4 cm hiatal hernia.                           - Non-bleeding gastric  ulcers with a clean ulcer                            base (Forrest Class III). Biopsied.                           - Non-bleeding duodenal diverticulum.                           - Normal examined duodenum. Moderate Sedation:      Per Anesthesia Care Recommendation:           - Discharge patient to home (ambulatory).                           - Resume previous diet.                           - Await pathology results.                            - If dysphagia recurs/persists, will need to                            perform CT chest to evaluate hernia. Procedure Code(s):        --- Professional ---                           [phone], Esophagogastroduodenoscopy, flexible,                            transoral; with insertion of guide wire followed by                            passage of dilator(s) through esophagus over guide                            wire                           43239, 59, Esophagogastroduodenoscopy, flexible,                            transoral; with biopsy, single or multiple Diagnosis Code(s):        --- Professional ---                           R13.10, Dysphagia, unspecified                           K44.9, Diaphragmatic hernia without obstruction or                            gangrene                           K25.9, Gastric ulcer, unspecified as acute or  chronic, without hemorrhage or perforation                           K57.10, Diverticulosis of small intestine without                            perforation or abscess without bleeding CPT copyright 2019 American Medical Association. All rights reserved. The codes documented in this report are preliminary and upon coder review may  be revised to meet current compliance requirements. [name], MD [name],  03/09/2021 10:10:49 AM This report has been signed electronically. Number of Addenda: 0

## 2021-03-09 NOTE — Discharge Instructions (Addendum)
You are being discharged to home.  Resume your previous diet.  We are waiting for your pathology results.  If issues swallowing recur/persists, will need to perform CT chest to evaluate hernia. Your physician has indicated that a repeat colonoscopy is not recommended due to your current age (80 years or older) for screening purposes.

## 2021-03-09 NOTE — Op Note (Signed)
[facility] Patient Name: Brenda Reynolds Procedure Date: 03/09/2021 9:38 AM MRN: [id number] Date of Birth: 1930/07/10 Attending MD: [name] ,  CSN: [id number] Age: 87 Admit Type: Outpatient Procedure:                Colonoscopy Indications:              Clinically significant diarrhea of unexplained                            origin Providers:                [name], Jannett Celestine, RN, Tammy Vaught,                            RN, Pandora Leiter, Technician Referring MD:              Medicines:                Monitored Anesthesia Care Complications:            No immediate complications. Estimated Blood Loss:     Estimated blood loss: none. Procedure:                Pre-Anesthesia Assessment:                           - Prior to the procedure, a History and Physical                            was performed, and patient medications, allergies                            and sensitivities were reviewed. The patient's                            tolerance of previous anesthesia was reviewed.                           - The risks and benefits of the procedure and the                            sedation options and risks were discussed with the                            patient. All questions were answered and informed                            consent was obtained.                           - ASA Grade Assessment: III - A patient with severe                            systemic disease.                           After obtaining informed consent, the colonoscope  was passed under direct vision. Throughout the                            procedure, the patient's blood pressure, pulse, and                            oxygen saturations were monitored continuously. The                            [id number] ([id number]) scope was introduced through                            the anus and advanced to the the cecum, identified                            by  appendiceal orifice and ileocecal valve. The                            colonoscopy was performed without difficulty. The                            patient tolerated the procedure well. The quality                            of the bowel preparation was excellent. Scope In: 9:44:18 AM Scope Out: 10:04:27 AM Scope Withdrawal Time: 0 hours 8 minutes 53 seconds  Total Procedure Duration: 0 hours 20 minutes 9 seconds  Findings:      The perianal and digital rectal examinations were normal.      A few small-mouthed diverticula were found in the sigmoid colon.      The rest of the colon appeared normal. Biopsies for histology were taken       with a cold forceps from the right colon and left colon for evaluation       of microscopic colitis.      The retroflexed view of the distal rectum and anal verge was normal and       showed no anal or rectal abnormalities. Impression:               - Diverticulosis in the sigmoid colon.                           - The rest of the examined colon is normal.                            Biopsied.                           - The distal rectum and anal verge are normal on                            retroflexion view. Moderate Sedation:      Per Anesthesia Care Recommendation:           - Discharge patient to home (ambulatory).                           -  Resume previous diet.                           - Await pathology results.                           - Repeat colonoscopy is not recommended due to                            current age (85 years or older) for screening                            purposes. Procedure Code(s):        --- Professional ---                           [phone], Colonoscopy, flexible; with biopsy, single                            or multiple Diagnosis Code(s):        --- Professional ---                           R19.7, Diarrhea, unspecified                           K57.30, Diverticulosis of large intestine without                             perforation or abscess without bleeding CPT copyright 2019 American Medical Association. All rights reserved. The codes documented in this report are preliminary and upon coder review may  be revised to meet current compliance requirements. [name], MD [name],  03/09/2021 10:09:19 AM This report has been signed electronically. Number of Addenda: 0

## 2021-03-09 NOTE — Anesthesia Procedure Notes (Signed)
Date/Time: 03/09/2021 9:26 AM Performed by: Julian Reil, CRNA Pre-anesthesia Checklist: Patient identified, Emergency Drugs available, Suction available and Patient being monitored Patient Re-evaluated:Patient Re-evaluated prior to induction Oxygen Delivery Method: Nasal cannula Induction Type: IV induction Placement Confirmation: positive ETCO2

## 2021-03-12 ENCOUNTER — Other Ambulatory Visit (INDEPENDENT_AMBULATORY_CARE_PROVIDER_SITE_OTHER): Payer: Self-pay | Admitting: Gastroenterology

## 2021-03-12 LAB — SURGICAL PATHOLOGY

## 2021-03-12 MED ORDER — COLESTIPOL HCL 1 G PO TABS: 1.0000 g | ORAL_TABLET | Freq: Three times a day (TID) | ORAL | 3 refills | Status: DC

## 2021-03-16 ENCOUNTER — Encounter (HOSPITAL_COMMUNITY): Payer: Self-pay | Admitting: Gastroenterology

## 2021-03-20 NOTE — Progress Notes (Signed)
result

## 2021-04-03 ENCOUNTER — Other Ambulatory Visit (INDEPENDENT_AMBULATORY_CARE_PROVIDER_SITE_OTHER): Payer: Self-pay

## 2021-04-03 ENCOUNTER — Telehealth (INDEPENDENT_AMBULATORY_CARE_PROVIDER_SITE_OTHER): Payer: Self-pay | Admitting: Gastroenterology

## 2021-04-03 NOTE — Telephone Encounter (Signed)
She wants to know if she needs to do anything else. She states she can get colestipol two in per day but cant make it TID. Wants to know if she has to continue this for life. Her symptoms of Diarrhea are completely gone.   I spoke with the patient and advised below.   03/12/2021:I reviewed the pathology results. I called the patient and informed  about theresults. Pathology showed mild gastritis and no HP, random colon biopsies showedcollagenous colitis, no need to repeat colonoscopy.   Thanks,   [name], MD Gastroenterology and Hepatology [facility]

## 2021-04-03 NOTE — Telephone Encounter (Signed)
Patient called the office stated she never received her procedure results - please advise - ph# [phone]

## 2021-04-03 NOTE — Telephone Encounter (Signed)
I tried reaching the patient today to discuss her questions but she did not pick up the phone and could not leave a message.  She can go down to colestipol twice a day dosing as long as her diarrhea is controlled.  She will need to take this medication indefinitely for her collagenous colitis.

## 2021-04-03 NOTE — Telephone Encounter (Signed)
Patient is aware of all. 

## 2021-05-01 ENCOUNTER — Encounter (INDEPENDENT_AMBULATORY_CARE_PROVIDER_SITE_OTHER): Payer: Medicare Other | Admitting: Ophthalmology

## 2021-05-10 DIAGNOSIS — N182 Chronic kidney disease, stage 2 (mild): Secondary | ICD-10-CM | POA: Diagnosis not present

## 2021-05-10 DIAGNOSIS — R1310 Dysphagia, unspecified: Secondary | ICD-10-CM | POA: Diagnosis not present

## 2021-05-10 DIAGNOSIS — J449 Chronic obstructive pulmonary disease, unspecified: Secondary | ICD-10-CM | POA: Diagnosis not present

## 2021-05-10 DIAGNOSIS — R197 Diarrhea, unspecified: Secondary | ICD-10-CM | POA: Diagnosis not present

## 2021-05-10 DIAGNOSIS — I10 Essential (primary) hypertension: Secondary | ICD-10-CM | POA: Diagnosis not present

## 2021-05-10 DIAGNOSIS — K2100 Gastro-esophageal reflux disease with esophagitis, without bleeding: Secondary | ICD-10-CM | POA: Diagnosis not present

## 2021-05-17 ENCOUNTER — Ambulatory Visit (INDEPENDENT_AMBULATORY_CARE_PROVIDER_SITE_OTHER): Payer: Medicare Other | Admitting: Ophthalmology

## 2021-05-17 ENCOUNTER — Encounter (INDEPENDENT_AMBULATORY_CARE_PROVIDER_SITE_OTHER): Payer: Self-pay | Admitting: Ophthalmology

## 2021-05-17 ENCOUNTER — Encounter (INDEPENDENT_AMBULATORY_CARE_PROVIDER_SITE_OTHER): Payer: Medicare Other | Admitting: Ophthalmology

## 2021-05-17 ENCOUNTER — Other Ambulatory Visit: Payer: Self-pay

## 2021-05-17 DIAGNOSIS — H353132 Nonexudative age-related macular degeneration, bilateral, intermediate dry stage: Secondary | ICD-10-CM

## 2021-05-17 DIAGNOSIS — H34811 Central retinal vein occlusion, right eye, with macular edema: Secondary | ICD-10-CM | POA: Diagnosis not present

## 2021-05-17 MED ORDER — BEVACIZUMAB 2.5 MG/0.1ML IZ SOSY
2.5000 mg | PREFILLED_SYRINGE | INTRAVITREAL | Status: AC | PRN
  Administered 2021-05-17: 2.5 mg via INTRAVITREAL

## 2021-05-17 NOTE — Assessment & Plan Note (Signed)

## 2021-05-17 NOTE — Progress Notes (Signed)
05/17/2021     CHIEF COMPLAINT Patient presents for Retina Follow Up (8 Wk F/U OD, poss Avastin OD//Pt sts she sees floaters off and on. Pt sts she is unsure which eye the floaters are in. [facility] other [facility].)   HISTORY OF PRESENT ILLNESS: Brenda Reynolds is a 87 y.o. female who presents to the clinic today for:   HPI     Retina Follow Up           Diagnosis: CRVO/BRVO   Laterality: right eye   Onset: 10 weeks ago   Severity: mild   Duration: 10 weeks   Course: stable   Comments: 8 Wk F/U OD, poss Avastin OD  Pt sts she sees floaters off and on. Pt sts she is unsure which eye the floaters are in. [place]         Comments   10 wks 2 days fu ou, avastin od Pt. is allergic to lidocaine. Patient states she has floaters but no changes to them and no new FOL.       Last edited by Frederik PearHaskett, Tonie M, COA on 05/17/2021  2:06 PM.      Referring physician: Toma DeitersHasanaj, Xaje A, MD [address] [place] [id number]  HISTORICAL INFORMATION:   Selected notes from the MEDICAL RECORD NUMBER       CURRENT MEDICATIONS: No current outpatient medications on file. (Ophthalmic Drugs)   No current facility-administered medications for this visit. (Ophthalmic Drugs)   Current Outpatient Medications (Other)  Medication Sig   acetaminophen (TYLENOL) 500 MG tablet Take 500-1,000 mg by mouth every 6 (six) hours as needed for moderate pain.   alendronate (FOSAMAX) 70 MG tablet Take 70 mg by mouth every Tuesday.   calcium-vitamin D (OSCAL WITH D) 500-200 MG-UNIT tablet Take 1 tablet by mouth 3 (three) times daily.   colestipol (COLESTID) 1 g tablet Take 1 tablet (1 g total) by mouth in the morning, at noon, and at bedtime.   famotidine (PEPCID) 20 MG tablet Take 20 mg by mouth daily.   folic acid (FOLVITE) 400 MCG tablet Take 400 mcg by mouth daily.   gabapentin (NEURONTIN) 300 MG capsule Take 300 mg by mouth 3 (three) times daily.    glucosamine-chondroitin 500-400 MG tablet Take 1 tablet by mouth daily.   losartan (COZAAR) 100 MG tablet Take 100 mg by mouth daily.   Omega-3 Fatty Acids (FISH OIL) 1000 MG CAPS Take 2,000 mg by mouth daily.   vitamin B-12 (CYANOCOBALAMIN) 500 MCG tablet Take 500 mcg by mouth daily.   vitamin C (ASCORBIC ACID) 500 MG tablet Take 1 tablet (500 mg total) by mouth daily.   No current facility-administered medications for this visit. (Other)      REVIEW OF SYSTEMS:    ALLERGIES Allergies  Allergen Reactions   Lidocaine Rash    Patch    PAST MEDICAL HISTORY Past Medical History:  Diagnosis Date   GERD (gastroesophageal reflux disease)    Hypertension    Intertrochanteric fracture of left femur (HCC)    Osteoporosis    Scoliosis    Past Surgical History:  Procedure Laterality Date   BIOPSY  03/09/2021   Procedure: BIOPSY;  Surgeon: Dolores Frameastaneda Mayorga, Daniel, MD;  [facility]: [facility];  Service: Gastroenterology;;   CATARACT EXTRACTION     COLONOSCOPY     Per patient this was done by Dr. Teena DunkBenson   COLONOSCOPY WITH PROPOFOL  N/A 03/09/2021   Procedure: COLONOSCOPY WITH PROPOFOL;  Surgeon: Dolores Frame, MD;  Location: [facility];  Service: Gastroenterology;  Laterality: N/A;  10:50   DILATION AND CURETTAGE OF UTERUS     ESOPHAGEAL DILATION N/A 03/09/2021   Procedure: ESOPHAGEAL DILATION;  Surgeon: Dolores Frame, MD;  Location: [facility];  Service: Gastroenterology;  Laterality: N/A;   ESOPHAGOGASTRODUODENOSCOPY (EGD) WITH PROPOFOL N/A 03/09/2021   Procedure: ESOPHAGOGASTRODUODENOSCOPY (EGD) WITH PROPOFOL;  Surgeon: Dolores Frame, MD;  Location: [facility];  Service: Gastroenterology;  Laterality: N/A;   EYE SURGERY     Bilateral cataracts   INTRAMEDULLARY (IM) NAIL INTERTROCHANTERIC Left 07/04/2019   Procedure: INTRAMEDULLARY (IM) NAIL INTERTROCHANTRIC;  Surgeon: Samson Frederic, MD;  Location: [facility];  Service: Orthopedics;   Laterality: Left;   TONSILLECTOMY     UPPER GASTROINTESTINAL ENDOSCOPY     Done by Dr. Gabriel Cirri    FAMILY HISTORY Family History  Problem Relation Age of Onset   Cancer Mother     SOCIAL HISTORY Social History   Tobacco Use   Smoking status: Former   Smokeless tobacco: Never  Vaping Use   Vaping Use: Never used  Substance Use Topics   Alcohol use: Yes    Alcohol/week: 1.0 standard drink    Types: 1 Glasses of wine per week   Drug use: Not Currently         OPHTHALMIC EXAM:  Base Eye Exam     Visual Acuity (ETDRS)       Right Left   Dist cc 20/60 -2 20/40   Dist ph cc 20/40 -2 20/30    Correction: Glasses         Tonometry (Tonopen, 1:30 PM)       Right Left   Pressure 12 12         Pupils       Pupils Dark Light Shape React APD   Right PERRL 4 3 Round Brisk None   Left PERRL 4 3 Round Brisk None         Visual Fields (Counting fingers)       Left Right    Full Full         Extraocular Movement       Right Left    Full Full         Neuro/Psych     Oriented x3: Yes   Mood/Affect: Normal         Dilation     Both eyes: 1.0% Mydriacyl, 2.5% Phenylephrine @ 1:47 PM           Slit Lamp and Fundus Exam     External Exam       Right Left   External Normal Normal         Slit Lamp Exam       Right Left   Lids/Lashes Normal Normal   Conjunctiva/Sclera White and quiet White and quiet   Cornea Clear Clear   Anterior Chamber Deep and quiet Deep and quiet   Iris Round and reactive Round and reactive   Lens Posterior chamber intraocular lens Posterior chamber intraocular lens   Anterior Vitreous Normal Normal         Fundus Exam       Right Left   Posterior Vitreous ,, Central vitreous floaters Posterior vitreous detachment   Disc Collaterals on the nerve, no NVD Normal   C/D Ratio 0.25 0.25   Macula Macular atrophy,,  Microaneurysms,, Epiretinal membrane  Hard drusen, Early age related macular degeneration    Vessels Old CRVO, no N/V Normal   Periphery Normal Normal            IMAGING AND PROCEDURES  Imaging and Procedures for 05/17/21  OCT, Retina - OU - Both Eyes       Right Eye Quality was good. Scan locations included subfoveal. Central Foveal Thickness: 305. Progression has been stable. Findings include abnormal foveal contour, cystoid macular edema, epiretinal membrane.   Left Eye Quality was good. Scan locations included subfoveal. Central Foveal Thickness: 258. Progression has been stable. Findings include normal foveal contour.   Notes Vastly improved CME OD at 10-week follow-up post injection Avastin for central retinal vein occlusion induced CME.  We will repeat injection today in the right eye to maintain and extend interval examination again to 10weeks     Intravitreal Injection, Pharmacologic Agent - OD - Right Eye       Time Out 05/17/2021. 2:38 PM. Confirmed correct patient, procedure, site, and patient consented.   Anesthesia Topical anesthesia was used. Anesthetic medications included Akten 3.5%.   Procedure Preparation included Tobramycin 0.3%, 10% betadine to eyelids, 5% betadine to ocular surface. A 30 gauge needle was used.   Injection: 2.5 mg bevacizumab 2.5 MG/0.1ML   Route: Intravitreal, Site: Right Eye   NDC: [phone], Lot: [id number]   Post-op Post injection exam found visual acuity of at least counting fingers. The patient tolerated the procedure well. There were no complications. The patient received written and verbal post procedure care education. Post injection medications were not given.              ASSESSMENT/PLAN:  Central retinal vein occlusion with macular edema of right eye The nature of central retinal vein occlusion was discussed with the patient including the division of types into nonischemic ischemic. The potential sequelae of ischemic central retinal vein occlusion, including macular edema, neovascularization, rubeosis  iridis, and neovascular glaucoma, were discussed, and the need for frequent follow-up.  The nature of macular edema and central retinal vein occlusion was discussed. The following options were considered:  1.Observation for a period to look for spontaneous improvement, is no linger the primary therapy. One-third worsen, one-third stay unchanged, and one-third improves.  2. Anti-VEGF Therapy. ( Lucentis, Avastin or Eylea ) injected  in intravitreal fashion, initially monthly then tailored to clinical response.  3. Intravitreal steroid usage, Kenalog, or Ozurdex, usually a second line therapy or in combination with anti-Vegf therapy noted above.  4. Panretinal laser photocoagulation to cause regression of iris neovascularization, or treat retinal  non-perfusion.  5. Surgical Management may include vitrectomy with incisions of peripheral veins to trigger retino choroidal anastomosis formation. This topic presented and discussed at [facility] 2011.   Intermediate stage nonexudative age-related macular degeneration of both eyes The nature of age--related macular degeneration was discussed with the patient as well as the distinction between dry and wet types. Checking an Amsler Grid daily with advice to return immediately should a distortion develop, was given to the patient. The patient 's smoking status now and in the past was determined and advice based on the AREDS study was provided regarding the consumption of antioxidant supplements. AREDS 2 vitamin formulation was recommended. Consumption of dark leafy vegetables and fresh fruits of various colors was recommended. Treatment modalities for wet macular degeneration particularly the use of intravitreal injections of anti-blood vessel growth factors was discussed with the patient. Avastin, Lucentis, and Eylea are the available options. On occasion,  therapy includes the use of photodynamic therapy and thermal laser. Stressed to the patient do not rub eyes.   Patient was advised to check Amsler Grid daily and return immediately if changes are noted. Instructions on using the grid were given to the patient. All patient questions were answered.      ICD-10-CM   1. Central retinal vein occlusion with macular edema of right eye  H34.8110 OCT, Retina - OU - Both Eyes    Intravitreal Injection, Pharmacologic Agent - OD - Right Eye    bevacizumab (AVASTIN) SOSY 2.5 mg    2. Intermediate stage nonexudative age-related macular degeneration of both eyes  H35.3132       1.  OD with CME secondary to CRV O, overall vastly improved and relatively stable at 10-week follow-up interval.  We will maintain this interval because of the history of multiple recurrences.  Repeat Avastin OD today and examination next in 10 weeks  2.  No signs of CNVM or complications OS  3.  Ophthalmic Meds Ordered this visit:  Meds ordered this encounter  Medications   bevacizumab (AVASTIN) SOSY 2.5 mg       Return in about 10 weeks (around 07/26/2021) for DILATE OU, AVASTIN OCT, OD.  There are no Patient Instructions on file for this visit.   Explained the diagnoses, plan, and follow up with the patient and they expressed understanding.  Patient expressed understanding of the importance of proper follow up care.   Alford Highland Constance Whittle M.D. Diseases & Surgery of the Retina and Vitreous [facility] 05/17/21     Abbreviations: M myopia (nearsighted); A astigmatism; H hyperopia (farsighted); P presbyopia; Mrx spectacle prescription;  CTL contact lenses; OD right eye; OS left eye; OU both eyes  XT exotropia; ET esotropia; PEK punctate epithelial keratitis; PEE punctate epithelial erosions; DES dry eye syndrome; MGD meibomian gland dysfunction; ATs artificial tears; PFAT's preservative free artificial tears; NSC nuclear sclerotic cataract; PSC posterior subcapsular cataract; ERM epi-retinal membrane; PVD posterior vitreous detachment; RD retinal detachment; DM  diabetes mellitus; DR diabetic retinopathy; NPDR non-proliferative diabetic retinopathy; PDR proliferative diabetic retinopathy; CSME clinically significant macular edema; DME diabetic macular edema; dbh dot blot hemorrhages; CWS cotton wool spot; POAG primary open angle glaucoma; C/D cup-to-disc ratio; HVF humphrey visual field; GVF goldmann visual field; OCT optical coherence tomography; IOP intraocular pressure; BRVO Branch retinal vein occlusion; CRVO central retinal vein occlusion; CRAO central retinal artery occlusion; BRAO branch retinal artery occlusion; RT retinal tear; SB scleral buckle; PPV pars plana vitrectomy; VH Vitreous hemorrhage; PRP panretinal laser photocoagulation; IVK intravitreal kenalog; VMT vitreomacular traction; MH Macular hole;  NVD neovascularization of the disc; NVE neovascularization elsewhere; AREDS age related eye disease study; ARMD age related macular degeneration; POAG primary open angle glaucoma; EBMD epithelial/anterior basement membrane dystrophy; ACIOL anterior chamber intraocular lens; IOL intraocular lens; PCIOL posterior chamber intraocular lens; Phaco/IOL phacoemulsification with intraocular lens placement; PRK photorefractive keratectomy; LASIK laser assisted in situ keratomileusis; HTN hypertension; DM diabetes mellitus; COPD chronic obstructive pulmonary disease

## 2021-05-17 NOTE — Assessment & Plan Note (Signed)
The nature of central retinal vein occlusion was discussed with the patient including the division of types into nonischemic ischemic. The potential sequelae of ischemic central retinal vein occlusion, including macular edema, neovascularization, rubeosis iridis, and neovascular glaucoma, were discussed, and the need for frequent follow-up.  The nature of macular edema and central retinal vein occlusion was discussed. The following options were considered:  1.Observation for a period to look for spontaneous improvement, is no linger the primary therapy. One-third worsen, one-third stay unchanged, and one-third improves.  2. Anti-VEGF Therapy. ( Lucentis, Avastin or Eylea ) injected  in intravitreal fashion, initially monthly then tailored to clinical response.  3. Intravitreal steroid usage, Kenalog, or Ozurdex, usually a second line therapy or in combination with anti-Vegf therapy noted above.  4. Panretinal laser photocoagulation to cause regression of iris neovascularization, or treat retinal  non-perfusion.  5. Surgical Management may include vitrectomy with incisions of peripheral veins to trigger retino choroidal anastomosis formation. This topic presented and discussed at [facility] 2011.  

## 2021-05-30 DIAGNOSIS — N182 Chronic kidney disease, stage 2 (mild): Secondary | ICD-10-CM | POA: Diagnosis not present

## 2021-05-30 DIAGNOSIS — K2100 Gastro-esophageal reflux disease with esophagitis, without bleeding: Secondary | ICD-10-CM | POA: Diagnosis not present

## 2021-05-30 DIAGNOSIS — J449 Chronic obstructive pulmonary disease, unspecified: Secondary | ICD-10-CM | POA: Diagnosis not present

## 2021-05-30 DIAGNOSIS — I10 Essential (primary) hypertension: Secondary | ICD-10-CM | POA: Diagnosis not present

## 2021-07-26 ENCOUNTER — Other Ambulatory Visit: Payer: Self-pay

## 2021-07-26 ENCOUNTER — Encounter (INDEPENDENT_AMBULATORY_CARE_PROVIDER_SITE_OTHER): Payer: Self-pay | Admitting: Ophthalmology

## 2021-07-26 ENCOUNTER — Ambulatory Visit (INDEPENDENT_AMBULATORY_CARE_PROVIDER_SITE_OTHER): Payer: Medicare Other | Admitting: Ophthalmology

## 2021-07-26 DIAGNOSIS — H34811 Central retinal vein occlusion, right eye, with macular edema: Secondary | ICD-10-CM

## 2021-07-26 MED ORDER — BEVACIZUMAB 2.5 MG/0.1ML IZ SOSY
2.5000 mg | PREFILLED_SYRINGE | INTRAVITREAL | Status: AC | PRN
  Administered 2021-07-26: 2.5 mg via INTRAVITREAL

## 2021-07-26 NOTE — Assessment & Plan Note (Signed)
CRV O with CME with recurrence of CME at 10-week interval this this instance.  Repeat examination confirms CME.  Repeat intravitreal injection Avastin today and shorten interval to 7 to 8 weeks

## 2021-07-26 NOTE — Progress Notes (Signed)
07/26/2021     CHIEF COMPLAINT Patient presents for  Chief Complaint  Patient presents with   Retina Follow Up    8 Wk F/U OD, poss Avastin OD  Pt sts she sees floaters off and on. Pt sts she is unsure which eye the floaters are in. No other changes to [place] reported OU.      HISTORY OF PRESENT ILLNESS: Brenda Reynolds is a 87 y.o. female who presents to the clinic today for:   HPI     Retina Follow Up   Patient presents with  CRVO/BRVO.  In right eye.  This started 10 weeks ago.  Severity is mild.  Duration of 10 weeks.  Since onset it is stable. Additional comments: 8 Wk F/U OD, poss Avastin OD  Pt sts she sees floaters off and on. Pt sts she is unsure which eye the floaters are in. No other changes to [place] reported OU.        Comments   10 week fu OU oct avastin OD. Patient states vision is stable and unchanged since last visit. Denies any new FOL.  Pt states "last week I saw a bunch of big floaters, not anymore though." Pt states she feels like something is in her right eye on the outer edge. Also states "on my left eye it seems like my eyelashes are in the way."      Last edited by Nelva Nay on 07/26/2021  2:15 PM.      Referring physician: Toma Deiters, MD [address] [place],  [place] [address]  HISTORICAL INFORMATION:   Selected notes from the MEDICAL RECORD NUMBER       CURRENT MEDICATIONS: No current outpatient medications on file. (Ophthalmic Drugs)   No current facility-administered medications for this visit. (Ophthalmic Drugs)   Current Outpatient Medications (Other)  Medication Sig   acetaminophen (TYLENOL) 500 MG tablet Take 500-1,000 mg by mouth every 6 (six) hours as needed for moderate pain.   alendronate (FOSAMAX) 70 MG tablet Take 70 mg by mouth every Tuesday.   calcium-vitamin D (OSCAL WITH D) 500-200 MG-UNIT tablet Take 1 tablet by mouth 3 (three) times daily.   colestipol (COLESTID) 1 g tablet Take 1 tablet (1 g total) by  mouth in the morning, at noon, and at bedtime.   famotidine (PEPCID) 20 MG tablet Take 20 mg by mouth daily.   folic acid (FOLVITE) 400 MCG tablet Take 400 mcg by mouth daily.   gabapentin (NEURONTIN) 300 MG capsule Take 300 mg by mouth 3 (three) times daily.   glucosamine-chondroitin 500-400 MG tablet Take 1 tablet by mouth daily.   losartan (COZAAR) 100 MG tablet Take 100 mg by mouth daily.   Omega-3 Fatty Acids (FISH OIL) 1000 MG CAPS Take 2,000 mg by mouth daily.   vitamin B-12 (CYANOCOBALAMIN) 500 MCG tablet Take 500 mcg by mouth daily.   vitamin C (ASCORBIC ACID) 500 MG tablet Take 1 tablet (500 mg total) by mouth daily.   No current facility-administered medications for this visit. (Other)      REVIEW OF SYSTEMS:    ALLERGIES Allergies  Allergen Reactions   Lidocaine Rash    Patch    PAST MEDICAL HISTORY Past Medical History:  Diagnosis Date   GERD (gastroesophageal reflux disease)    Hypertension    Intertrochanteric fracture of left femur (HCC)    Osteoporosis    Scoliosis    Past Surgical History:  Procedure Laterality Date  BIOPSY  03/09/2021   Procedure: BIOPSY;  Surgeon: Marguerita Merles, Reuel Boom, MD;  [facility];  Service: Gastroenterology;;   CATARACT EXTRACTION     COLONOSCOPY     Per patient this was done by Dr. Teena Dunk   COLONOSCOPY WITH PROPOFOL N/A 03/09/2021   Procedure: COLONOSCOPY WITH PROPOFOL;  Surgeon: Dolores Frame, MD;  [facility]: [facility];  Service: Gastroenterology;  Laterality: N/A;  10:50   DILATION AND CURETTAGE OF UTERUS     ESOPHAGEAL DILATION N/A 03/09/2021   Procedure: ESOPHAGEAL DILATION;  Surgeon: Dolores Frame, MD;  [facility];  Service: Gastroenterology;  Laterality: N/A;   ESOPHAGOGASTRODUODENOSCOPY (EGD) WITH PROPOFOL N/A 03/09/2021   Procedure: ESOPHAGOGASTRODUODENOSCOPY (EGD) WITH PROPOFOL;  Surgeon: Dolores Frame, MD;  [facility];  Service:  Gastroenterology;  Laterality: N/A;   EYE SURGERY     Bilateral cataracts   INTRAMEDULLARY (IM) NAIL INTERTROCHANTERIC Left 07/04/2019   Procedure: INTRAMEDULLARY (IM) NAIL INTERTROCHANTRIC;  Surgeon: Samson Frederic, MD;  Location: [facility];  Service: Orthopedics;  Laterality: Left;   TONSILLECTOMY     UPPER GASTROINTESTINAL ENDOSCOPY     Done by Dr. Gabriel Cirri    FAMILY HISTORY Family History  Problem Relation Age of Onset   Cancer Mother     SOCIAL HISTORY Social History   Tobacco Use   Smoking status: Former   Smokeless tobacco: Never  Vaping Use   Vaping Use: Never used  Substance Use Topics   Alcohol use: Yes    Alcohol/week: 1.0 standard drink    Types: 1 Glasses of wine per week   Drug use: Not Currently         OPHTHALMIC EXAM:  Base Eye Exam     Visual Acuity (ETDRS)       Right Left   Dist cc 20/80 -1 20/40 -2   Dist ph cc 20/70 -1 20/30 -1    Correction: Glasses         Tonometry (Tonopen, 2:21 PM)       Right Left   Pressure 13 15         Pupils       Pupils Dark Light Shape APD   Right PERRL 4 3 Round None   Left PERRL 4 3 Round None         Extraocular Movement       Right Left    Full Full         Neuro/Psych     Oriented x3: Yes   Mood/Affect: Normal         Dilation     Right eye: 1.0% Mydriacyl, 2.5% Phenylephrine            Slit Lamp and Fundus Exam     External Exam       Right Left   External Normal Normal         Slit Lamp Exam       Right Left   Lids/Lashes Normal Normal   Conjunctiva/Sclera White and quiet White and quiet   Cornea Clear Clear   Anterior Chamber Deep and quiet Deep and quiet   Iris Round and reactive Round and reactive   Lens Posterior chamber intraocular lens Posterior chamber intraocular lens   Anterior Vitreous Normal Normal         Fundus Exam       Right Left   Posterior Vitreous ,, Central vitreous floaters Posterior vitreous detachment   Disc Collaterals on  the nerve, no NVD Small collateral superiorly   C/D Ratio 0.25 0.25   Macula Macular atrophy,,  Microaneurysms,, Epiretinal membrane, Cystoid macular edema Hard drusen, Early age related macular degeneration   Vessels Old CRVO, no N/V Normal   Periphery Normal Normal            IMAGING AND PROCEDURES  Imaging and Procedures for 07/26/21  Intravitreal Injection, Pharmacologic Agent - OD - Right Eye       Time Out 07/26/2021. 2:55 PM. Confirmed correct patient, procedure, site, and patient consented.   Anesthesia Topical anesthesia was used. Anesthetic medications included Lidocaine 4%.   Procedure Preparation included Tobramycin 0.3%, 10% betadine to eyelids, 5% betadine to ocular surface. A 30 gauge needle was used.   Injection: 2.5 mg bevacizumab 2.5 MG/0.1ML   Route: Intravitreal, Site: Right Eye   NDC: [phone], Lot: [id number]   Post-op Post injection exam found visual acuity of at least counting fingers. The patient tolerated the procedure well. There were no complications. The patient received written and verbal post procedure care education. Post injection medications included ocuflox.      OCT, Retina - OU - Both Eyes       Right Eye Quality was good. Scan locations included subfoveal. Central Foveal Thickness: 492. Progression has worsened. Findings include abnormal foveal contour, cystoid macular edema, epiretinal membrane.   Left Eye Quality was good. Scan locations included subfoveal. Central Foveal Thickness: 263. Progression has been stable. Findings include normal foveal contour.   Notes Increased CME OD at 10-week follow-up post injection Avastin for central retinal vein occlusion induced CME.  We will repeat injection today in the right eye to maintain and shorten interval examination again to 8 weeks             ASSESSMENT/PLAN:  Central retinal vein occlusion with macular edema of right eye CRV O with CME with recurrence of CME at 10-week  interval this this instance.  Repeat examination confirms CME.  Repeat intravitreal injection Avastin today and shorten interval to 7 to 8 weeks     ICD-10-CM   1. Central retinal vein occlusion with macular edema of right eye  H34.8110 Intravitreal Injection, Pharmacologic Agent - OD - Right Eye    OCT, Retina - OU - Both Eyes    bevacizumab (AVASTIN) SOSY 2.5 mg      1.  OD with recurrent CME from CRV O at 10-week interval this instance.  Repeat injection Avastin today  2.  Return visit dilate OU in 7 to 8 weeks likely Avastin OCT OD  3.  Ophthalmic Meds Ordered this visit:  Meds ordered this encounter  Medications   bevacizumab (AVASTIN) SOSY 2.5 mg       Return in about 7 weeks (around 09/13/2021) for OD, AVASTIN OCT, 7-8 weeks, DILATE OU.  There are no Patient Instructions on file for this visit.   Explained the diagnoses, plan, and follow up with the patient and they expressed understanding.  Patient expressed understanding of the importance of proper follow up care.   Alford Highland Mildred Bollard M.D. Diseases & Surgery of the Retina and Vitreous [facility] 07/26/21     Abbreviations: M myopia (nearsighted); A astigmatism; H hyperopia (farsighted); P presbyopia; Mrx spectacle prescription;  CTL contact lenses; OD right eye; OS left eye; OU both eyes  XT exotropia; ET esotropia; PEK punctate epithelial keratitis; PEE punctate epithelial erosions; DES dry eye syndrome; MGD meibomian gland dysfunction; ATs artificial tears; PFAT's preservative free  artificial tears; NSC nuclear sclerotic cataract; PSC posterior subcapsular cataract; ERM epi-retinal membrane; PVD posterior vitreous detachment; RD retinal detachment; DM diabetes mellitus; DR diabetic retinopathy; NPDR non-proliferative diabetic retinopathy; PDR proliferative diabetic retinopathy; CSME clinically significant macular edema; DME diabetic macular edema; dbh dot blot hemorrhages; CWS cotton wool spot; POAG  primary open angle glaucoma; C/D cup-to-disc ratio; HVF humphrey visual field; GVF goldmann visual field; OCT optical coherence tomography; IOP intraocular pressure; BRVO Branch retinal vein occlusion; CRVO central retinal vein occlusion; CRAO central retinal artery occlusion; BRAO branch retinal artery occlusion; RT retinal tear; SB scleral buckle; PPV pars plana vitrectomy; VH Vitreous hemorrhage; PRP panretinal laser photocoagulation; IVK intravitreal kenalog; VMT vitreomacular traction; MH Macular hole;  NVD neovascularization of the disc; NVE neovascularization elsewhere; AREDS age related eye disease study; ARMD age related macular degeneration; POAG primary open angle glaucoma; EBMD epithelial/anterior basement membrane dystrophy; ACIOL anterior chamber intraocular lens; IOL intraocular lens; PCIOL posterior chamber intraocular lens; Phaco/IOL phacoemulsification with intraocular lens placement; PRK photorefractive keratectomy; LASIK laser assisted in situ keratomileusis; HTN hypertension; DM diabetes mellitus; COPD chronic obstructive pulmonary disease

## 2021-07-30 DIAGNOSIS — I10 Essential (primary) hypertension: Secondary | ICD-10-CM | POA: Diagnosis not present

## 2021-07-30 DIAGNOSIS — N182 Chronic kidney disease, stage 2 (mild): Secondary | ICD-10-CM | POA: Diagnosis not present

## 2021-07-30 DIAGNOSIS — J449 Chronic obstructive pulmonary disease, unspecified: Secondary | ICD-10-CM | POA: Diagnosis not present

## 2021-07-30 DIAGNOSIS — K2100 Gastro-esophageal reflux disease with esophagitis, without bleeding: Secondary | ICD-10-CM | POA: Diagnosis not present

## 2021-08-16 DIAGNOSIS — Z0000 Encounter for general adult medical examination without abnormal findings: Secondary | ICD-10-CM | POA: Diagnosis not present

## 2021-08-16 DIAGNOSIS — I10 Essential (primary) hypertension: Secondary | ICD-10-CM | POA: Diagnosis not present

## 2021-08-16 DIAGNOSIS — N182 Chronic kidney disease, stage 2 (mild): Secondary | ICD-10-CM | POA: Diagnosis not present

## 2021-08-16 DIAGNOSIS — R1310 Dysphagia, unspecified: Secondary | ICD-10-CM | POA: Diagnosis not present

## 2021-08-16 DIAGNOSIS — R197 Diarrhea, unspecified: Secondary | ICD-10-CM | POA: Diagnosis not present

## 2021-08-16 DIAGNOSIS — K2100 Gastro-esophageal reflux disease with esophagitis, without bleeding: Secondary | ICD-10-CM | POA: Diagnosis not present

## 2021-09-13 ENCOUNTER — Encounter (INDEPENDENT_AMBULATORY_CARE_PROVIDER_SITE_OTHER): Payer: Medicare Other | Admitting: Ophthalmology

## 2021-09-27 ENCOUNTER — Encounter (INDEPENDENT_AMBULATORY_CARE_PROVIDER_SITE_OTHER): Payer: Medicare Other | Admitting: Ophthalmology

## 2021-09-28 DIAGNOSIS — N182 Chronic kidney disease, stage 2 (mild): Secondary | ICD-10-CM | POA: Diagnosis not present

## 2021-09-28 DIAGNOSIS — K2100 Gastro-esophageal reflux disease with esophagitis, without bleeding: Secondary | ICD-10-CM | POA: Diagnosis not present

## 2021-09-28 DIAGNOSIS — J449 Chronic obstructive pulmonary disease, unspecified: Secondary | ICD-10-CM | POA: Diagnosis not present

## 2021-09-28 DIAGNOSIS — I10 Essential (primary) hypertension: Secondary | ICD-10-CM | POA: Diagnosis not present

## 2021-10-04 ENCOUNTER — Encounter (INDEPENDENT_AMBULATORY_CARE_PROVIDER_SITE_OTHER): Payer: Medicare Other | Admitting: Ophthalmology

## 2021-10-25 ENCOUNTER — Encounter (INDEPENDENT_AMBULATORY_CARE_PROVIDER_SITE_OTHER): Payer: Medicare Other | Admitting: Ophthalmology

## 2021-10-30 ENCOUNTER — Ambulatory Visit (INDEPENDENT_AMBULATORY_CARE_PROVIDER_SITE_OTHER): Payer: Medicare Other | Admitting: Ophthalmology

## 2021-10-30 ENCOUNTER — Other Ambulatory Visit: Payer: Self-pay

## 2021-10-30 ENCOUNTER — Encounter (INDEPENDENT_AMBULATORY_CARE_PROVIDER_SITE_OTHER): Payer: Self-pay | Admitting: Ophthalmology

## 2021-10-30 DIAGNOSIS — H34811 Central retinal vein occlusion, right eye, with macular edema: Secondary | ICD-10-CM

## 2021-10-30 DIAGNOSIS — H353132 Nonexudative age-related macular degeneration, bilateral, intermediate dry stage: Secondary | ICD-10-CM | POA: Diagnosis not present

## 2021-10-30 MED ORDER — BEVACIZUMAB 2.5 MG/0.1ML IZ SOSY
2.5000 mg | PREFILLED_SYRINGE | INTRAVITREAL | Status: AC | PRN
  Administered 2021-10-30: 2.5 mg via INTRAVITREAL

## 2021-10-30 NOTE — Progress Notes (Signed)
10/30/2021     CHIEF COMPLAINT Patient presents for  Chief Complaint  Patient presents with   Retina Follow Up      HISTORY OF PRESENT ILLNESS: Brenda Reynolds is a 88 y.o. female who presents to the clinic today for:   HPI     Retina Follow Up           Diagnosis: CRVO/BRVO   Laterality: right eye   Onset: 3 months ago   Severity: mild   Duration: 3 months   Course: stable         Comments   3 month fu ou  and OCT and Avastin OD  Pt states VA OU stable since last visit. Pt denies FOL, floaters, or ocular pain OU.  Pt states, "I have had a lot of company here lately with floaters in my right eye. They seem to be going away now but they were pretty frequent."         Last edited by Demetrios LollBusick, Erica L, COA on 10/30/2021  1:45 PM.      Referring physician: Toma DeitersHasanaj, Xaje A, MD [address] [place] [id number]  HISTORICAL INFORMATION:   Selected notes from the MEDICAL RECORD NUMBER       CURRENT MEDICATIONS: No current outpatient medications on file. (Ophthalmic Drugs)   No current facility-administered medications for this visit. (Ophthalmic Drugs)   Current Outpatient Medications (Other)  Medication Sig   acetaminophen (TYLENOL) 500 MG tablet Take 500-1,000 mg by mouth every 6 (six) hours as needed for moderate pain.   alendronate (FOSAMAX) 70 MG tablet Take 70 mg by mouth every Tuesday.   calcium-vitamin D (OSCAL WITH D) 500-200 MG-UNIT tablet Take 1 tablet by mouth 3 (three) times daily.   colestipol (COLESTID) 1 g tablet Take 1 tablet (1 g total) by mouth in the morning, at noon, and at bedtime.   famotidine (PEPCID) 20 MG tablet Take 20 mg by mouth daily.   folic acid (FOLVITE) 400 MCG tablet Take 400 mcg by mouth daily.   gabapentin (NEURONTIN) 300 MG capsule Take 300 mg by mouth 3 (three) times daily.   glucosamine-chondroitin 500-400 MG tablet Take 1 tablet by mouth daily.   losartan (COZAAR) 100 MG tablet Take 100 mg by mouth daily.    Omega-3 Fatty Acids (FISH OIL) 1000 MG CAPS Take 2,000 mg by mouth daily.   vitamin B-12 (CYANOCOBALAMIN) 500 MCG tablet Take 500 mcg by mouth daily.   vitamin C (ASCORBIC ACID) 500 MG tablet Take 1 tablet (500 mg total) by mouth daily.   No current facility-administered medications for this visit. (Other)      REVIEW OF SYSTEMS: ROS   Negative for: Constitutional, Gastrointestinal, Neurological, Skin, Genitourinary, Musculoskeletal, HENT, Endocrine, Cardiovascular, Eyes, Respiratory, Psychiatric, Allergic/Imm, Heme/Lymph Last edited by Edmon Crapeankin, Monigue Spraggins A, MD on 10/30/2021  2:28 PM.       ALLERGIES Allergies  Allergen Reactions   Lidocaine Rash    Patch    PAST MEDICAL HISTORY Past Medical History:  Diagnosis Date   GERD (gastroesophageal reflux disease)    Hypertension    Intertrochanteric fracture of left femur (HCC)    Osteoporosis    Scoliosis    Past Surgical History:  Procedure Laterality Date   BIOPSY  03/09/2021   Procedure: BIOPSY;  Surgeon: Dolores Frameastaneda Mayorga, Daniel, MD;  [facility];  Service: Gastroenterology;;   CATARACT EXTRACTION     COLONOSCOPY     Per patient this was  done by Dr. Teena Dunk   COLONOSCOPY WITH PROPOFOL N/A 03/09/2021   Procedure: COLONOSCOPY WITH PROPOFOL;  Surgeon: Dolores Frame, MD;  [facility];  Service: Gastroenterology;  Laterality: N/A;  10:50   DILATION AND CURETTAGE OF UTERUS     ESOPHAGEAL DILATION N/A 03/09/2021   Procedure: ESOPHAGEAL DILATION;  Surgeon: Dolores Frame, MD;  [facility];  Service: Gastroenterology;  Laterality: N/A;   ESOPHAGOGASTRODUODENOSCOPY (EGD) WITH PROPOFOL N/A 03/09/2021   Procedure: ESOPHAGOGASTRODUODENOSCOPY (EGD) WITH PROPOFOL;  Surgeon: Dolores Frame, MD;  [facility];  Service: Gastroenterology;  Laterality: N/A;   EYE SURGERY     Bilateral cataracts   INTRAMEDULLARY (IM) NAIL INTERTROCHANTERIC Left 07/04/2019   Procedure:  INTRAMEDULLARY (IM) NAIL INTERTROCHANTRIC;  Surgeon: Samson Frederic, MD;  Location: [facility];  Service: Orthopedics;  Laterality: Left;   TONSILLECTOMY     UPPER GASTROINTESTINAL ENDOSCOPY     Done by Dr. Gabriel Cirri    FAMILY HISTORY Family History  Problem Relation Age of Onset   Cancer Mother     SOCIAL HISTORY Social History   Tobacco Use   Smoking status: Former   Smokeless tobacco: Never  Vaping Use   Vaping Use: Never used  Substance Use Topics   Alcohol use: Yes    Alcohol/week: 1.0 standard drink    Types: 1 Glasses of wine per week   Drug use: Not Currently         OPHTHALMIC EXAM:  Base Eye Exam     Visual Acuity (ETDRS)       Right Left   Dist cc 20/200 20/30   Dist ph cc 20/80 -2 NI    Correction: Glasses         Tonometry (Tonopen, 1:49 PM)       Right Left   Pressure 13 16         Pupils       Pupils Shape React APD   Right PERRL Round Brisk None   Left PERRL Round Brisk None         Visual Fields (Counting fingers)       Left Right    Full Full         Extraocular Movement       Right Left    Full, Ortho Full, Ortho         Neuro/Psych     Oriented x3: Yes   Mood/Affect: Normal         Dilation     Both eyes: 1.0% Mydriacyl, 2.5% Phenylephrine @ 1:49 PM           Slit Lamp and Fundus Exam     External Exam       Right Left   External Normal Normal         Slit Lamp Exam       Right Left   Lids/Lashes Normal Normal   Conjunctiva/Sclera White and quiet White and quiet   Cornea Clear Clear   Anterior Chamber Deep and quiet Deep and quiet   Iris Round and reactive Round and reactive   Lens Posterior chamber intraocular lens Posterior chamber intraocular lens   Anterior Vitreous Normal Normal         Fundus Exam       Right Left   Posterior Vitreous ,, Central vitreous floaters Posterior vitreous detachment   Disc Collaterals on the nerve, no NVD Small collateral superiorly   C/D Ratio  0.25 0.25  Macula Macular atrophy,,  Microaneurysms,, Epiretinal membrane, Cystoid macular edema Hard drusen, Early age related macular degeneration   Vessels Old CRVO, no N/V Normal   Periphery Normal Normal            IMAGING AND PROCEDURES  Imaging and Procedures for 10/30/21  OCT, Retina - OU - Both Eyes       Right Eye Quality was good. Scan locations included subfoveal. Central Foveal Thickness: 533. Progression has worsened. Findings include abnormal foveal contour, cystoid macular edema, epiretinal membrane.   Left Eye Quality was good. Scan locations included subfoveal. Central Foveal Thickness: 261. Progression has been stable. Findings include normal foveal contour.   Notes Increased CME OD at 3 months follow-up post injection Avastin for central retinal vein occlusion induced CME.  We will repeat injection today in the right eye to maintain and shorten interval examination again to 8 weeks     Intravitreal Injection, Pharmacologic Agent - OD - Right Eye       Time Out 10/30/2021. 2:30 PM. Confirmed correct patient, procedure, site, and patient consented.   Anesthesia Topical anesthesia was used. Anesthetic medications included Lidocaine 4%.   Procedure Preparation included Tobramycin 0.3%, 10% betadine to eyelids, 5% betadine to ocular surface. A 30 gauge needle was used.   Injection: 2.5 mg bevacizumab 2.5 MG/0.1ML   Route: Intravitreal, Site: Right Eye   NDC: [phone], Lot: [id number]   Post-op Post injection exam found visual acuity of at least counting fingers. The patient tolerated the procedure well. There were no complications. The patient received written and verbal post procedure care education. Post injection medications included ocuflox.              ASSESSMENT/PLAN:  Intermediate stage nonexudative age-related macular degeneration of both eyes No sign of CNVM OU  Central retinal vein occlusion with macular edema of right eye CSME  with increased macular thickening at 56-month interval.  We will need to repeat injection into vegF, Avastin today and examination next in 8 weeks     ICD-10-CM   1. Central retinal vein occlusion with macular edema of right eye  H34.8110 OCT, Retina - OU - Both Eyes    Intravitreal Injection, Pharmacologic Agent - OD - Right Eye    bevacizumab (AVASTIN) SOSY 2.5 mg    2. Intermediate stage nonexudative age-related macular degeneration of both eyes  H35.3132       1.  OD, center involved CME from CRV O recurrent and some 3 months.  Will need repeat injection into vegF today and follow-up next in 8 weeks  2.  3.  Ophthalmic Meds Ordered this visit:  Meds ordered this encounter  Medications   bevacizumab (AVASTIN) SOSY 2.5 mg       Return in about 8 weeks (around 12/25/2021) for dilate, OD, AVASTIN OCT.  There are no Patient Instructions on file for this visit.   Explained the diagnoses, plan, and follow up with the patient and they expressed understanding.  Patient expressed understanding of the importance of proper follow up care.   Clent Demark Apolinar Bero M.D. Diseases & Surgery of the Retina and Vitreous [facility] 10/30/21     Abbreviations: M myopia (nearsighted); A astigmatism; H hyperopia (farsighted); P presbyopia; Mrx spectacle prescription;  CTL contact lenses; OD right eye; OS left eye; OU both eyes  XT exotropia; ET esotropia; PEK punctate epithelial keratitis; PEE punctate epithelial erosions; DES dry eye syndrome; MGD meibomian gland dysfunction; ATs artificial tears; PFAT's preservative  free artificial tears; [place] nuclear sclerotic cataract; PSC posterior subcapsular cataract; ERM epi-retinal membrane; PVD posterior vitreous detachment; RD retinal detachment; DM diabetes mellitus; DR diabetic retinopathy; NPDR non-proliferative diabetic retinopathy; PDR proliferative diabetic retinopathy; CSME clinically significant macular edema; DME diabetic macular  edema; dbh dot blot hemorrhages; CWS cotton wool spot; POAG primary open angle glaucoma; C/D cup-to-disc ratio; HVF humphrey visual field; GVF goldmann visual field; OCT optical coherence tomography; IOP intraocular pressure; BRVO Branch retinal vein occlusion; CRVO central retinal vein occlusion; CRAO central retinal artery occlusion; BRAO branch retinal artery occlusion; RT retinal tear; SB scleral buckle; PPV pars plana vitrectomy; VH Vitreous hemorrhage; PRP panretinal laser photocoagulation; IVK intravitreal kenalog; VMT vitreomacular traction; MH Macular hole;  NVD neovascularization of the disc; NVE neovascularization elsewhere; AREDS age related eye disease study; ARMD age related macular degeneration; POAG primary open angle glaucoma; EBMD epithelial/anterior basement membrane dystrophy; ACIOL anterior chamber intraocular lens; IOL intraocular lens; PCIOL posterior chamber intraocular lens; Phaco/IOL phacoemulsification with intraocular lens placement; [place] photorefractive keratectomy; LASIK laser assisted in situ keratomileusis; HTN hypertension; DM diabetes mellitus; COPD chronic obstructive pulmonary disease

## 2021-10-30 NOTE — Assessment & Plan Note (Signed)
No sign of CNVM OU 

## 2021-10-30 NOTE — Assessment & Plan Note (Signed)
CSME with increased macular thickening at 51-month interval.  We will need to repeat injection into vegF, Avastin today and examination next in 8 weeks

## 2021-11-22 DIAGNOSIS — I10 Essential (primary) hypertension: Secondary | ICD-10-CM | POA: Diagnosis not present

## 2021-11-22 DIAGNOSIS — N182 Chronic kidney disease, stage 2 (mild): Secondary | ICD-10-CM | POA: Diagnosis not present

## 2021-11-22 DIAGNOSIS — K2100 Gastro-esophageal reflux disease with esophagitis, without bleeding: Secondary | ICD-10-CM | POA: Diagnosis not present

## 2021-11-22 DIAGNOSIS — J449 Chronic obstructive pulmonary disease, unspecified: Secondary | ICD-10-CM | POA: Diagnosis not present

## 2021-11-27 DIAGNOSIS — K2100 Gastro-esophageal reflux disease with esophagitis, without bleeding: Secondary | ICD-10-CM | POA: Diagnosis not present

## 2021-11-27 DIAGNOSIS — N182 Chronic kidney disease, stage 2 (mild): Secondary | ICD-10-CM | POA: Diagnosis not present

## 2021-11-27 DIAGNOSIS — J449 Chronic obstructive pulmonary disease, unspecified: Secondary | ICD-10-CM | POA: Diagnosis not present

## 2021-11-27 DIAGNOSIS — I10 Essential (primary) hypertension: Secondary | ICD-10-CM | POA: Diagnosis not present

## 2021-12-25 ENCOUNTER — Encounter (INDEPENDENT_AMBULATORY_CARE_PROVIDER_SITE_OTHER): Payer: Medicare Other | Admitting: Ophthalmology

## 2022-01-03 ENCOUNTER — Ambulatory Visit (INDEPENDENT_AMBULATORY_CARE_PROVIDER_SITE_OTHER): Payer: Medicare Other | Admitting: Ophthalmology

## 2022-01-03 ENCOUNTER — Encounter (INDEPENDENT_AMBULATORY_CARE_PROVIDER_SITE_OTHER): Payer: Self-pay | Admitting: Ophthalmology

## 2022-01-03 DIAGNOSIS — H353132 Nonexudative age-related macular degeneration, bilateral, intermediate dry stage: Secondary | ICD-10-CM | POA: Diagnosis not present

## 2022-01-03 DIAGNOSIS — H34811 Central retinal vein occlusion, right eye, with macular edema: Secondary | ICD-10-CM | POA: Diagnosis not present

## 2022-01-03 MED ORDER — BEVACIZUMAB 2.5 MG/0.1ML IZ SOSY
2.5000 mg | PREFILLED_SYRINGE | INTRAVITREAL | Status: AC | PRN
  Administered 2022-01-03: 2.5 mg via INTRAVITREAL

## 2022-01-03 NOTE — Assessment & Plan Note (Signed)
OD, with massive CME secondary to CRV O which had worsened at 70-month follow-up interval now improved at 9-week interval post Avastin we will repeat injection today and maintain 8 to 9-week evaluations ?

## 2022-01-03 NOTE — Assessment & Plan Note (Signed)
OU, stable overall no sign of CNVM ?

## 2022-01-03 NOTE — Progress Notes (Signed)
? ? ?01/03/2022 ? ?  ? ?CHIEF COMPLAINT ?Patient presents for  ?Chief Complaint  ?Patient presents with  ? Central Retinal Vein Occlusion  ? ? ? ? ?HISTORY OF PRESENT ILLNESS: ?Brenda Reynolds is a 88 y.o. female who presents to the clinic today for:  ? ?HPI   ?9 weeks for dilate, OD AVASTIN OCT. ?Pt stated floaters in the right eye ?Pt stated no changes in vision. ? ? ?Last edited by Angeline Slim on 01/03/2022  1:35 PM.  ?  ? ? ?Referring physician: ?Toma Deiters, MD ?[address] ?[place],  [place] [address] ? ?HISTORICAL INFORMATION:  ? ?Selected notes from the MEDICAL RECORD NUMBER ?  ?   ? ?CURRENT MEDICATIONS: ?No current outpatient medications on file. (Ophthalmic Drugs)  ? ?No current facility-administered medications for this visit. (Ophthalmic Drugs)  ? ?Current Outpatient Medications (Other)  ?Medication Sig  ? acetaminophen (TYLENOL) 500 MG tablet Take 500-1,000 mg by mouth every 6 (six) hours as needed for moderate pain.  ? alendronate (FOSAMAX) 70 MG tablet Take 70 mg by mouth every Tuesday.  ? calcium-vitamin D (OSCAL WITH D) 500-200 MG-UNIT tablet Take 1 tablet by mouth 3 (three) times daily.  ? colestipol (COLESTID) 1 g tablet Take 1 tablet (1 g total) by mouth in the morning, at noon, and at bedtime.  ? famotidine (PEPCID) 20 MG tablet Take 20 mg by mouth daily.  ? folic acid (FOLVITE) 400 MCG tablet Take 400 mcg by mouth daily.  ? gabapentin (NEURONTIN) 300 MG capsule Take 300 mg by mouth 3 (three) times daily.  ? glucosamine-chondroitin 500-400 MG tablet Take 1 tablet by mouth daily.  ? losartan (COZAAR) 100 MG tablet Take 100 mg by mouth daily.  ? Omega-3 Fatty Acids (FISH OIL) 1000 MG CAPS Take 2,000 mg by mouth daily.  ? vitamin B-12 (CYANOCOBALAMIN) 500 MCG tablet Take 500 mcg by mouth daily.  ? vitamin C (ASCORBIC ACID) 500 MG tablet Take 1 tablet (500 mg total) by mouth daily.  ? ?No current facility-administered medications for this visit. (Other)  ? ? ? ? ?REVIEW OF SYSTEMS: ?ROS   ?Negative for:  Constitutional, Gastrointestinal, Neurological, Skin, Genitourinary, Musculoskeletal, HENT, Endocrine, Cardiovascular, Eyes, Respiratory, Psychiatric, Allergic/Imm, Heme/Lymph ?Last edited by Angeline Slim on 01/03/2022  1:35 PM.  ?  ? ? ? ?ALLERGIES ?Allergies  ?Allergen Reactions  ? Lidocaine Rash  ?  Patch  ? ? ?PAST MEDICAL HISTORY ?Past Medical History:  ?Diagnosis Date  ? GERD (gastroesophageal reflux disease)   ? Hypertension   ? Intertrochanteric fracture of left femur (HCC)   ? Osteoporosis   ? Scoliosis   ? ?Past Surgical History:  ?Procedure Laterality Date  ? BIOPSY  03/09/2021  ? Procedure: BIOPSY;  Surgeon: Marguerita Merles, Reuel Boom, MD;  [facility];  Service: Gastroenterology;;  ? CATARACT EXTRACTION    ? COLONOSCOPY    ? Per patient this was done by Dr. Teena Dunk  ? COLONOSCOPY WITH PROPOFOL N/A 03/09/2021  ? Procedure: COLONOSCOPY WITH PROPOFOL;  Surgeon: Dolores Frame, MD;  [facility];  Service: Gastroenterology;  Laterality: N/A;  10:50  ? DILATION AND CURETTAGE OF UTERUS    ? ESOPHAGEAL DILATION N/A 03/09/2021  ? Procedure: ESOPHAGEAL DILATION;  Surgeon: Marguerita Merles, Reuel Boom, MD;  [facility];  Service: Gastroenterology;  Laterality: N/A;  ? ESOPHAGOGASTRODUODENOSCOPY (EGD) WITH PROPOFOL N/A 03/09/2021  ? Procedure: ESOPHAGOGASTRODUODENOSCOPY (EGD) WITH PROPOFOL;  Surgeon: Dolores Frame, MD;  [facility];  Service: Gastroenterology;  Laterality: N/A;  ? EYE SURGERY    ? Bilateral cataracts  ? INTRAMEDULLARY (IM) NAIL INTERTROCHANTERIC Left 07/04/2019  ? Procedure: INTRAMEDULLARY (IM) NAIL INTERTROCHANTRIC;  Surgeon: Samson FredericSwinteck, Brian, MD;  Location[facility];  Service: Orthopedics;  Laterality: Left;  ? TONSILLECTOMY    ? UPPER GASTROINTESTINAL ENDOSCOPY    ? Done by Dr. Gabriel Cirriemason  ? ? ?FAMILY HISTORY ?Family History  ?Problem Relation Age of Onset  ? Cancer Mother   ? ? ?SOCIAL HISTORY ?Social History  ? ?Tobacco Use  ? Smoking status:  Former  ? Smokeless tobacco: Never  ?Vaping Use  ? Vaping Use: Never used  ?Substance Use Topics  ? Alcohol use: Yes  ?  Alcohol/week: 1.0 standard drink  ?  Types: 1 Glasses of wine per week  ? Drug use: Not Currently  ? ?  ? ?  ? ?OPHTHALMIC EXAM: ? ?Base Eye Exam   ? ? Visual Acuity (ETDRS)   ? ?   Right Left  ? Dist cc 20/80 20/70 -1  ? Dist ph cc 20/50 -1 20/20 -1  ? ? Correction: Glasses  ? ?  ?  ? ? Tonometry (Tonopen, 1:44 PM)   ? ?   Right Left  ? Pressure 17 14  ? ?  ?  ? ? Pupils   ? ?   Pupils APD  ? Right PERRL None  ? Left PERRL None  ? ?  ?  ? ? Visual Fields   ? ?   Left Right  ?  Full Full  ? ?  ?  ? ? Extraocular Movement   ? ?   Right Left  ?  Full Full  ? ?  ?  ? ? Neuro/Psych   ? ? Oriented x3: Yes  ? Mood/Affect: Normal  ? ?  ?  ? ? Dilation   ? ? Right eye: 1.0% Mydriacyl, 2.5% Phenylephrine @ 1:44 PM  ? ?  ?  ? ?  ? ?Slit Lamp and Fundus Exam   ? ? External Exam   ? ?   Right Left  ? External Normal Normal  ? ?  ?  ? ? Slit Lamp Exam   ? ?   Right Left  ? Lids/Lashes Normal Normal  ? Conjunctiva/Sclera White and quiet White and quiet  ? Cornea Clear Clear  ? Anterior Chamber Deep and quiet Deep and quiet  ? Iris Round and reactive Round and reactive  ? Lens Posterior chamber intraocular lens Posterior chamber intraocular lens  ? Anterior Vitreous Normal Normal  ? ?  ?  ? ? Fundus Exam   ? ?   Right Left  ? Posterior Vitreous ,, Central vitreous floaters   ? Disc Collaterals on the nerve, no NVD   ? C/D Ratio 0.25   ? Macula Macular atrophy, Microaneurysms, Epiretinal membrane, Cystoid macular edema less   ? Vessels Old CRVO, no N/V   ? Periphery Normal   ? ?  ?  ? ?  ? ? ?IMAGING AND PROCEDURES  ?Imaging and Procedures for 01/03/22 ? ?OCT, Retina - OU - Both Eyes   ? ?   ?Right Eye ?Quality was good. Scan locations included subfoveal. Central Foveal Thickness: 241. Progression has improved. Findings include abnormal foveal contour, cystoid macular edema, epiretinal membrane.  ? ?Left  Eye ?Quality was good. Scan locations included subfoveal. Central Foveal Thickness: 263. Progression has been stable. Findings include normal foveal contour.  ? ?  Notes ?Decreased CME OD at 9 weeks follow-up post injection Avastin for central retinal vein occlusion induced CME.  We will repeat injection today in the right eye to maintain and shorten interval examination again to 8 weeks ? ?  ? ?Intravitreal Injection, Pharmacologic Agent - OD - Right Eye   ? ?   ?Time Out ?01/03/2022. 2:10 PM. Confirmed correct patient, procedure, site, and patient consented.  ? ?Anesthesia ?Topical anesthesia was used. Anesthetic medications included Lidocaine 4%.  ? ?Procedure ?Preparation included Tobramycin 0.3%, 10% betadine to eyelids, 5% betadine to ocular surface. A 30 gauge needle was used.  ? ?Injection: ?2.5 mg bevacizumab 2.5 MG/0.1ML ?  Route: Intravitreal, Site: Right Eye ?  NDC: 31497-026-37, Lot: [id number]  ? ?Post-op ?Post injection exam found visual acuity of at least counting fingers. The patient tolerated the procedure well. There were no complications. The patient received written and verbal post procedure care education. Post injection medications included ocuflox.  ? ?  ? ? ?  ?  ? ?  ?ASSESSMENT/PLAN: ? ?Intermediate stage nonexudative age-related macular degeneration of both eyes ?OU, stable overall no sign of CNVM ? ?Central retinal vein occlusion with macular edema of right eye ?OD, with massive CME secondary to CRV O which had worsened at 91-month follow-up interval now improved at 9-week interval post Avastin we will repeat injection today and maintain 8 to 9-week evaluations  ? ?  ICD-10-CM   ?1. Central retinal vein occlusion with macular edema of right eye  H34.8110 OCT, Retina - OU - Both Eyes  ?  Intravitreal Injection, Pharmacologic Agent - OD - Right Eye  ?  bevacizumab (AVASTIN) SOSY 2.5 mg  ?  ?2. Intermediate stage nonexudative age-related macular degeneration of both eyes  H35.3132   ?  ? ? ?1.  OD  with improved CME at shorter interval follow-up today, post Avastin, 9-week interval.  Repeat injection today to maintain improved acuity and prevent long-term visual acuity decline and/or neovascular complications.

## 2022-02-21 DIAGNOSIS — N182 Chronic kidney disease, stage 2 (mild): Secondary | ICD-10-CM | POA: Diagnosis not present

## 2022-02-21 DIAGNOSIS — R32 Unspecified urinary incontinence: Secondary | ICD-10-CM | POA: Diagnosis not present

## 2022-02-21 DIAGNOSIS — R197 Diarrhea, unspecified: Secondary | ICD-10-CM | POA: Diagnosis not present

## 2022-02-21 DIAGNOSIS — I10 Essential (primary) hypertension: Secondary | ICD-10-CM | POA: Diagnosis not present

## 2022-02-21 DIAGNOSIS — Z0000 Encounter for general adult medical examination without abnormal findings: Secondary | ICD-10-CM | POA: Diagnosis not present

## 2022-02-21 DIAGNOSIS — K2100 Gastro-esophageal reflux disease with esophagitis, without bleeding: Secondary | ICD-10-CM | POA: Diagnosis not present

## 2022-03-07 ENCOUNTER — Encounter (INDEPENDENT_AMBULATORY_CARE_PROVIDER_SITE_OTHER): Payer: Self-pay | Admitting: Ophthalmology

## 2022-03-07 ENCOUNTER — Ambulatory Visit (INDEPENDENT_AMBULATORY_CARE_PROVIDER_SITE_OTHER): Payer: Medicare Other | Admitting: Ophthalmology

## 2022-03-07 DIAGNOSIS — H353132 Nonexudative age-related macular degeneration, bilateral, intermediate dry stage: Secondary | ICD-10-CM

## 2022-03-07 DIAGNOSIS — H34811 Central retinal vein occlusion, right eye, with macular edema: Secondary | ICD-10-CM | POA: Diagnosis not present

## 2022-03-07 DIAGNOSIS — H348122 Central retinal vein occlusion, left eye, stable: Secondary | ICD-10-CM | POA: Diagnosis not present

## 2022-03-07 MED ORDER — BEVACIZUMAB 2.5 MG/0.1ML IZ SOSY
2.5000 mg | PREFILLED_SYRINGE | INTRAVITREAL | Status: AC | PRN
  Administered 2022-03-07: 2.5 mg via INTRAVITREAL

## 2022-03-07 NOTE — Progress Notes (Signed)
03/07/2022     CHIEF COMPLAINT Patient presents for  Chief Complaint  Patient presents with   Retina Follow Up      HISTORY OF PRESENT ILLNESS: Brenda Reynolds is a 88 y.o. female who presents to the clinic today for:   HPI     Retina Follow Up           Diagnosis: Central retinal vein occlusion with macular edema   Laterality: right eye   Onset: 9 weeks ago         Comments   9 weeks dilate OU, Avastin OCT OD. Patient reports she was having issues with pain from arthritis so her doctor prescribed hydrocodone in May for the pain.  Patient reports she has a lot of "tiny" floaters in her right eye.      Last edited by Laurin Coder on 03/07/2022  1:52 PM.      Referring physician: Neale Burly, MD [place],  [place] [phone]  HISTORICAL INFORMATION:   Selected notes from the MEDICAL RECORD NUMBER       CURRENT MEDICATIONS: No current outpatient medications on file. (Ophthalmic Drugs)   No current facility-administered medications for this visit. (Ophthalmic Drugs)   Current Outpatient Medications (Other)  Medication Sig   acetaminophen (TYLENOL) 500 MG tablet Take 500-1,000 mg by mouth every 6 (six) hours as needed for moderate pain.   alendronate (FOSAMAX) 70 MG tablet Take 70 mg by mouth every Tuesday.   calcium-vitamin D (OSCAL WITH D) 500-200 MG-UNIT tablet Take 1 tablet by mouth 3 (three) times daily.   colestipol (COLESTID) 1 g tablet Take 1 tablet (1 g total) by mouth in the morning, at noon, and at bedtime.   famotidine (PEPCID) 20 MG tablet Take 20 mg by mouth daily.   folic acid (FOLVITE) [id number] MCG tablet Take 400 mcg by mouth daily.   gabapentin (NEURONTIN) 300 MG capsule Take 300 mg by mouth 3 (three) times daily.   glucosamine-chondroitin 500-400 MG tablet Take 1 tablet by mouth daily.   losartan (COZAAR) 100 MG tablet Take 100 mg by mouth daily.   Omega-3 Fatty Acids (FISH OIL) 1000 MG CAPS Take 2,000 mg by mouth daily.    vitamin B-12 (CYANOCOBALAMIN) 500 MCG tablet Take 500 mcg by mouth daily.   vitamin C (ASCORBIC ACID) 500 MG tablet Take 1 tablet (500 mg total) by mouth daily.   No current facility-administered medications for this visit. (Other)      REVIEW OF SYSTEMS: ROS   Negative for: Constitutional, Gastrointestinal, Neurological, Skin, Genitourinary, Musculoskeletal, HENT, Endocrine, Cardiovascular, Eyes, Respiratory, Psychiatric, Allergic/Imm, Heme/Lymph Last edited by Hurman Horn, MD on 03/07/2022  2:13 PM.       ALLERGIES Allergies  Allergen Reactions   Lidocaine Rash    Patch    PAST MEDICAL HISTORY Past Medical History:  Diagnosis Date   GERD (gastroesophageal reflux disease)    Hypertension    Intertrochanteric fracture of left femur ([place])    Osteoporosis    Scoliosis    Past Surgical History:  Procedure Laterality Date   BIOPSY  03/09/2021   Procedure: BIOPSY;  Surgeon: Harvel Quale, MD;  [facility];  Service: Gastroenterology;;   CATARACT EXTRACTION     COLONOSCOPY     Per patient this was done by Dr. Britta Mccreedy   COLONOSCOPY WITH PROPOFOL N/A 03/09/2021   Procedure: COLONOSCOPY WITH PROPOFOL;  Surgeon: Harvel Quale, MD;  [facility];  Service: Gastroenterology;  Laterality: N/A;  10:50   DILATION AND CURETTAGE OF UTERUS     ESOPHAGEAL DILATION N/A 03/09/2021   Procedure: ESOPHAGEAL DILATION;  Surgeon: Harvel Quale, MD;  [facility];  Service: Gastroenterology;  Laterality: N/A;   ESOPHAGOGASTRODUODENOSCOPY (EGD) WITH PROPOFOL N/A 03/09/2021   Procedure: ESOPHAGOGASTRODUODENOSCOPY (EGD) WITH PROPOFOL;  Surgeon: Harvel Quale, MD;  [facility];  Service: Gastroenterology;  Laterality: N/A;   EYE SURGERY     Bilateral cataracts   INTRAMEDULLARY (IM) NAIL INTERTROCHANTERIC Left 07/04/2019   Procedure: INTRAMEDULLARY (IM) NAIL INTERTROCHANTRIC;  Surgeon: Rod Can, MD;   Location: [place];  Service: Orthopedics;  Laterality: Left;   TONSILLECTOMY     UPPER GASTROINTESTINAL ENDOSCOPY     Done by Dr. Anthony Sar    FAMILY HISTORY Family History  Problem Relation Age of Onset   Cancer Mother     SOCIAL HISTORY Social History   Tobacco Use   Smoking status: Former   Smokeless tobacco: Never  Vaping Use   Vaping Use: Never used  Substance Use Topics   Alcohol use: Yes    Alcohol/week: 1.0 standard drink of alcohol    Types: 1 Glasses of wine per week   Drug use: Not Currently         OPHTHALMIC EXAM:  Base Eye Exam     Visual Acuity (ETDRS)       Right Left   Dist cc 20/50 -2 20/50 +1   Dist ph cc  20/25 -1    Correction: Glasses         Tonometry (Tonopen, 1:58 PM)       Right Left   Pressure 20 21         Tonometry #2 (Tonopen, 1:58 PM)       Right Left   Pressure 20 21         Pupils       Pupils APD   Right PERRL None   Left PERRL None         Extraocular Movement       Right Left    Full Full         Neuro/Psych     Oriented x3: Yes   Mood/Affect: Normal         Dilation     Both eyes: 1.0% Mydriacyl, 2.5% Phenylephrine @ 1:58 PM           Slit Lamp and Fundus Exam     External Exam       Right Left   External Normal Normal         Slit Lamp Exam       Right Left   Lids/Lashes Normal Normal   Conjunctiva/Sclera White and quiet White and quiet   Cornea Clear Clear   Anterior Chamber Deep and quiet Deep and quiet   Iris Round and reactive Round and reactive   Lens Posterior chamber intraocular lens Posterior chamber intraocular lens   Anterior Vitreous Normal Normal         Fundus Exam       Right Left   Posterior Vitreous ,, Central vitreous floaters Posterior vitreous detachment   Disc Collaterals on the nerve, no NVD Small collateral superiorly   C/D Ratio 0.25 0.25   Macula Macular atrophy, Microaneurysms, Epiretinal membrane, Cystoid macular edema less Hard  drusen, Early age related macular degeneration   Vessels Old CRVO, no N/V Normal   Periphery Normal Normal  IMAGING AND PROCEDURES  Imaging and Procedures for 03/07/22  OCT, Retina - OU - Both Eyes       Right Eye Quality was good. Scan locations included subfoveal. Central Foveal Thickness: 227. Progression has improved. Findings include abnormal foveal contour, cystoid macular edema, epiretinal membrane.   Left Eye Quality was good. Scan locations included subfoveal. Central Foveal Thickness: 260. Progression has been stable. Findings include normal foveal contour.   Notes Decreased CME OD at 9 weeks follow-up post injection Avastin for central retinal vein occlusion induced CME.  We will repeat injection today in the right eye and maintain and repeat evaluation in 9 weeks     Intravitreal Injection, Pharmacologic Agent - OD - Right Eye       Time Out 03/07/2022. 2:14 PM. Confirmed correct patient, procedure, site, and patient consented.   Anesthesia Topical anesthesia was used. Anesthetic medications included Lidocaine 4%.   Procedure Preparation included 5% betadine to ocular surface, 10% betadine to eyelids, Tobramycin 0.3%. A 30 gauge needle was used.   Injection: 2.5 mg bevacizumab 2.5 MG/0.1ML   Route: Intravitreal, Site: Right Eye   NDC: [phone], Lot: [id number], Expiration date: 04/28/2022   Post-op Post injection exam found visual acuity of at least counting fingers. The patient tolerated the procedure well. There were no complications. The patient received written and verbal post procedure care education. Post injection medications included ocuflox.              ASSESSMENT/PLAN:  Central retinal vein occlusion with macular edema of right eye OD, CME center involved and temporally from CRV O remained stable again at 9 weeks today post Avastin.  We will repeat injection today and maintain this interval as longer duration of interval  examinations have resulted in massive recurrent CME  Intermediate stage nonexudative age-related macular degeneration of both eyes No sign of CNVM OU  Stable central retinal vein occlusion of left eye Old collaterals optic nerve OS no CME     ICD-10-CM   1. Central retinal vein occlusion with macular edema of right eye  H34.8110 OCT, Retina - OU - Both Eyes    Intravitreal Injection, Pharmacologic Agent - OD - Right Eye    bevacizumab (AVASTIN) SOSY 2.5 mg    2. Intermediate stage nonexudative age-related macular degeneration of both eyes  H35.3132 OCT, Retina - OU - Both Eyes    3. Stable central retinal vein occlusion of left eye  H34.8122       1.  OD continued improvement of CME from CRV O and maintained at 9-week interval.  Repeat injection today  2.  3.  Ophthalmic Meds Ordered this visit:  Meds ordered this encounter  Medications   bevacizumab (AVASTIN) SOSY 2.5 mg       Return in about 9 weeks (around 05/09/2022) for dilate, OD, AVASTIN OCT.  There are no Patient Instructions on file for this visit.   Explained the diagnoses, plan, and follow up with the patient and they expressed understanding.  Patient expressed understanding of the importance of proper follow up care.   Clent Demark Lark Runk M.D. Diseases & Surgery of the Retina and Vitreous [facility] 03/07/22     Abbreviations: M myopia (nearsighted); A astigmatism; H hyperopia (farsighted); P presbyopia; Mrx spectacle prescription;  CTL contact lenses; OD right eye; OS left eye; OU both eyes  XT exotropia; ET esotropia; PEK punctate epithelial keratitis; PEE punctate epithelial erosions; DES dry eye syndrome; MGD meibomian gland dysfunction; ATs artificial  tears; PFAT's preservative free artificial tears; [place] nuclear sclerotic cataract; PSC posterior subcapsular cataract; ERM epi-retinal membrane; PVD posterior vitreous detachment; RD retinal detachment; DM diabetes mellitus; DR diabetic  retinopathy; NPDR non-proliferative diabetic retinopathy; PDR proliferative diabetic retinopathy; CSME clinically significant macular edema; DME diabetic macular edema; dbh dot blot hemorrhages; CWS cotton wool spot; POAG primary open angle glaucoma; C/D cup-to-disc ratio; HVF humphrey visual field; GVF goldmann visual field; OCT optical coherence tomography; IOP intraocular pressure; BRVO Branch retinal vein occlusion; CRVO central retinal vein occlusion; CRAO central retinal artery occlusion; BRAO branch retinal artery occlusion; RT retinal tear; SB scleral buckle; PPV pars plana vitrectomy; VH Vitreous hemorrhage; PRP panretinal laser photocoagulation; IVK intravitreal kenalog; VMT vitreomacular traction; MH Macular hole;  NVD neovascularization of the disc; NVE neovascularization elsewhere; AREDS age related eye disease study; ARMD age related macular degeneration; POAG primary open angle glaucoma; EBMD epithelial/anterior basement membrane dystrophy; ACIOL anterior chamber intraocular lens; IOL intraocular lens; PCIOL posterior chamber intraocular lens; Phaco/IOL phacoemulsification with intraocular lens placement; [place] photorefractive keratectomy; LASIK laser assisted in situ keratomileusis; HTN hypertension; DM diabetes mellitus; COPD chronic obstructive pulmonary disease

## 2022-03-07 NOTE — Assessment & Plan Note (Signed)
OD, CME center involved and temporally from CRV O remained stable again at 9 weeks today post Avastin.  We will repeat injection today and maintain this interval as longer duration of interval examinations have resulted in massive recurrent CME

## 2022-03-07 NOTE — Assessment & Plan Note (Signed)
Old collaterals optic nerve OS no CME

## 2022-03-07 NOTE — Assessment & Plan Note (Signed)
No sign of CNVM OU 

## 2022-04-01 ENCOUNTER — Other Ambulatory Visit (INDEPENDENT_AMBULATORY_CARE_PROVIDER_SITE_OTHER): Payer: Self-pay | Admitting: Gastroenterology

## 2022-04-01 ENCOUNTER — Telehealth (INDEPENDENT_AMBULATORY_CARE_PROVIDER_SITE_OTHER): Payer: Self-pay

## 2022-04-01 NOTE — Telephone Encounter (Signed)
Message sent to [facility] to have this scheduled.

## 2022-04-01 NOTE — Telephone Encounter (Signed)
Please schedule an appointment for appointment.      colestipol (COLESTID) 1 g tablet       Sig: TAKE ONE TABLET BY MOUTH EVERY MORNING, AT NOON AND AT BEDTIME   Disp:  210 tablet    Refills:  0 (Pharmacy requested: 3)   Start: 04/01/2022   Class: Normal   Authorized by: Dolores Frame, MD      To be filled at: [facility] - [place], [place] - [place] [address]       04/01/2022 - Rx Request: Interface, [employer] Out and others [employer]) View All Conversations on this Encounter April 01, 2022        04/01/22  4:38 PM Dolores Frame, MD routed this conversation to Me        04/01/22  4:38 PM  Dolores Frame, MD signed an order  Marguerita Merles, Reuel Boom, MD to Me      04/01/22  4:38 PM Note Will refill medication for 3 months, needs follow up appointment with any provider in order to receive any refills.   Thanks,   [name], MD Gastroenterology and Hepatology [facility];

## 2022-04-01 NOTE — Telephone Encounter (Signed)
Will refill medication for 3 months, needs follow up appointment with any provider in order to receive any refills. ? ?Thanks, ? ?Sanjeev Main Castaneda, MD ?Gastroenterology and Hepatology ?[facility] ? ?

## 2022-04-03 ENCOUNTER — Telehealth (INDEPENDENT_AMBULATORY_CARE_PROVIDER_SITE_OTHER): Payer: Self-pay

## 2022-05-09 ENCOUNTER — Encounter (INDEPENDENT_AMBULATORY_CARE_PROVIDER_SITE_OTHER): Payer: Medicare Other | Admitting: Ophthalmology

## 2022-05-14 ENCOUNTER — Encounter (INDEPENDENT_AMBULATORY_CARE_PROVIDER_SITE_OTHER): Payer: Medicare Other | Admitting: Ophthalmology

## 2022-05-28 DIAGNOSIS — I10 Essential (primary) hypertension: Secondary | ICD-10-CM | POA: Diagnosis not present

## 2022-05-28 DIAGNOSIS — K2100 Gastro-esophageal reflux disease with esophagitis, without bleeding: Secondary | ICD-10-CM | POA: Diagnosis not present

## 2022-05-28 DIAGNOSIS — R32 Unspecified urinary incontinence: Secondary | ICD-10-CM | POA: Diagnosis not present

## 2022-05-28 DIAGNOSIS — N182 Chronic kidney disease, stage 2 (mild): Secondary | ICD-10-CM | POA: Diagnosis not present

## 2022-06-06 ENCOUNTER — Encounter (INDEPENDENT_AMBULATORY_CARE_PROVIDER_SITE_OTHER): Payer: Self-pay | Admitting: Gastroenterology

## 2022-06-06 ENCOUNTER — Ambulatory Visit (INDEPENDENT_AMBULATORY_CARE_PROVIDER_SITE_OTHER): Payer: Medicare Other | Admitting: Gastroenterology

## 2022-06-06 VITALS — BP 136/75 | HR 76 | Temp 98.4°F | Ht 59.0 in | Wt 119.6 lb

## 2022-06-06 DIAGNOSIS — R1310 Dysphagia, unspecified: Secondary | ICD-10-CM | POA: Diagnosis not present

## 2022-06-06 DIAGNOSIS — K529 Noninfective gastroenteritis and colitis, unspecified: Secondary | ICD-10-CM

## 2022-06-06 DIAGNOSIS — K52831 Collagenous colitis: Secondary | ICD-10-CM | POA: Insufficient documentation

## 2022-06-06 MED ORDER — OMEPRAZOLE 40 MG PO CPDR: 40.0000 mg | DELAYED_RELEASE_CAPSULE | Freq: Every day | ORAL | 1 refills | Status: DC

## 2022-06-06 NOTE — Progress Notes (Signed)
Referring Provider: Toma Deiters, MD Primary Care Physician:  Toma Deiters, MD Primary GI Physician: Levon Hedger  Chief Complaint  Patient presents with   chronic diarrhea    Follow up on chronic diarrhea. Takes colestipol once or twice per day. Does not take three times a day as prescribed. Does help with diarrhea.    HPI:   Brenda Reynolds is a 88 y.o. female with past medical history of GERD, HTN, collagenous colitis and osteoporosis  Patient presenting today for follow up of diarrhea secondary to collagenous colitis and dysphagia.   History:  Last seen may 2022, at that time taking famotidine for her GERD, having dysphagia to solids and liquids every 1-2 days, no heartburn or odynophagia. Also with some watery to looser stools, 3-4x/day, started on colestied 1g q8h with some improvement.   Patient recommended to undergo EGD and Colonoscopy, as outlined below.  Present:  Patient states she is doing well today. She reports she eats a lot of beans, peas and corn. She is having a BM in the morning and sometimes has to go again pretty soon after. She notes there is not much stool on the toilet tissue that often after having a BM. Stools are mostly soft/mushy, occasionally has harder stools. Denies melena, has occasional toilet tissue hematochezia upon straining. She is taking colestipol daily, sometimes BID. Occasionally she will take it TID but it is difficult with all of her other meds.   She states that after the EGD she felt that dysphagia improved thereafter, she notes that she is having episodes where after she eats she will feel a sensation of something in her throat, sometimes will have to go try to regurgitate the food back up and will note only presence of white, foamy liquid, never notes any food coming back up. She tries to drink water which passes but then still has the sensation of something stuck in her throat. She notes that these symptoms may only occur a couple of times  per week, usually with her dinner in the evening. She denies heartburn. She denies any sore throat, sometimes will cough which helps to clear her throat. She notes some hoarseness in the mornings on occasion.   Last Colonoscopy:03/09/21- Diverticulosis in the sigmoid colon. - The rest of the examined colon is normal. Biopsied-collagenous colitis .- The distal rectum and anal verge are normal on retroflexion view. Last Endoscopy:03/09/21 - No endoscopic esophageal abnormality to explain patient's dysphagia. Esophagus dilated. - 4 cm hiatal hernia. - Non-bleeding gastric ulcers with a clean ulcer base (Forrest Class III). Biopsied-no HP  - Non-bleeding duodenal diverticulum. - Normal examined duodenum.  Recommendations:  No repeat Colonoscopy   Past Medical History:  Diagnosis Date   GERD (gastroesophageal reflux disease)    Hypertension    Intertrochanteric fracture of left femur (HCC)    Osteoporosis    Scoliosis     Past Surgical History:  Procedure Laterality Date   BIOPSY  03/09/2021   Procedure: BIOPSY;  Surgeon: Dolores Frame, MD;  [facility];  Service: Gastroenterology;;   CATARACT EXTRACTION     COLONOSCOPY     Per patient this was done by Dr. Teena Dunk   COLONOSCOPY WITH PROPOFOL N/A 03/09/2021   Procedure: COLONOSCOPY WITH PROPOFOL;  Surgeon: Dolores Frame, MD;  [facility]: [facility];  Service: Gastroenterology;  Laterality: N/A;  10:50   DILATION AND CURETTAGE OF UTERUS     ESOPHAGEAL DILATION N/A 03/09/2021   Procedure: ESOPHAGEAL  DILATION;  Surgeon: Marguerita Merles, Reuel Boom, MD;  [facility];  Service: Gastroenterology;  Laterality: N/A;   ESOPHAGOGASTRODUODENOSCOPY (EGD) WITH PROPOFOL N/A 03/09/2021   Procedure: ESOPHAGOGASTRODUODENOSCOPY (EGD) WITH PROPOFOL;  Surgeon: Dolores Frame, MD;  [facility];  Service: Gastroenterology;  Laterality: N/A;   EYE SURGERY     Bilateral cataracts    INTRAMEDULLARY (IM) NAIL INTERTROCHANTERIC Left 07/04/2019   Procedure: INTRAMEDULLARY (IM) NAIL INTERTROCHANTRIC;  Surgeon: Samson Frederic, MD;  [facility]: [facility];  Service: Orthopedics;  Laterality: Left;   TONSILLECTOMY     UPPER GASTROINTESTINAL ENDOSCOPY     Done by Dr. Gabriel Cirri    Current Outpatient Medications  Medication Sig Dispense Refill   alendronate (FOSAMAX) 70 MG tablet Take 70 mg by mouth every Tuesday.     calcium-vitamin D (OSCAL WITH D) 500-200 MG-UNIT tablet Take 1 tablet by mouth 3 (three) times daily.     colestipol (COLESTID) 1 g tablet TAKE ONE TABLET BY MOUTH EVERY MORNING, AT NOON AND AT BEDTIME 210 tablet 0   famotidine (PEPCID) 20 MG tablet Take 20 mg by mouth daily.     folic acid (FOLVITE) 400 MCG tablet Take 400 mcg by mouth daily.     gabapentin (NEURONTIN) 300 MG capsule Take 300 mg by mouth 3 (three) times daily.     glucosamine-chondroitin 500-400 MG tablet Take 1 tablet by mouth daily.     HYDROcodone-acetaminophen (NORCO/VICODIN) 5-325 MG tablet Take 1 tablet by mouth 2 (two) times daily as needed for moderate pain.     losartan (COZAAR) 100 MG tablet Take 100 mg by mouth daily.     Multiple Vitamin (MULTIVITAMIN) tablet Take 1 tablet by mouth daily.     Omega-3 Fatty Acids (FISH OIL) 1000 MG CAPS Take 2,000 mg by mouth daily.     vitamin B-12 (CYANOCOBALAMIN) 500 MCG tablet Take 500 mcg by mouth daily.     vitamin C (ASCORBIC ACID) 500 MG tablet Take 1 tablet (500 mg total) by mouth daily.     acetaminophen (TYLENOL) 500 MG tablet Take 500-1,000 mg by mouth every 6 (six) hours as needed for moderate pain. (Patient not taking: Reported on 06/06/2022)     No current facility-administered medications for this visit.    Allergies as of 06/06/2022 - Review Complete 06/06/2022  Allergen Reaction Noted   Lidocaine Rash 07/03/2019    Family History  Problem Relation Age of Onset   Cancer Mother     Social History   Socioeconomic History   Marital  status: Widowed    Spouse name: Not on file   Number of children: Not on file   Years of education: Not on file   Highest education level: Not on file  Occupational History   Not on file  Tobacco Use   Smoking status: Former    Passive exposure: Past   Smokeless tobacco: Never  Vaping Use   Vaping Use: Never used  Substance and Sexual Activity   Alcohol use: Yes    Alcohol/week: 1.0 standard drink of alcohol    Types: 1 Glasses of wine per week   Drug use: Not Currently   Sexual activity: Not on file  Other Topics Concern   Not on file  Social History Narrative   Not on file   Social Determinants of Health   Financial Resource Strain: Not on file  Food Insecurity: Not on file  Transportation Needs: Not on file  Physical Activity: Not on file  Stress: Not  on file  Social Connections: Not on file   Review of systems General: negative for malaise, night sweats, fever, chills, weight loss Neck: Negative for lumps, goiter, pain and significant neck swelling Resp: Negative for cough, wheezing, dyspnea at rest CV: Negative for chest pain, leg swelling, palpitations, orthopnea GI: denies melena, hematochezia, nausea, vomiting, diarrhea, constipation,odyonophagia, early satiety or unintentional weight loss. +dysphagia  MSK: Negative for joint pain or swelling, back pain, and muscle pain. Derm: Negative for itching or rash Psych: Denies depression, anxiety, memory loss, confusion. No homicidal or suicidal ideation.  Heme: Negative for prolonged bleeding, bruising easily, and swollen nodes. Endocrine: Negative for cold or heat intolerance, polyuria, polydipsia and goiter. Neuro: negative for tremor, gait imbalance, syncope and seizures. The remainder of the review of systems is noncontributory.  Physical Exam: BP 136/75 (BP Location: Right Arm, Patient Position: Sitting, Cuff Size: Normal)   Pulse 76   Temp 98.4 F (36.9 C) (Oral)   Ht 4\' 11"  (1.499 m)   Wt 119 lb 9.6 oz  (54.3 kg)   BMI 24.16 kg/m  General:   Alert and oriented. No distress noted. Pleasant and cooperative.  Head:  Normocephalic and atraumatic. Eyes:  Conjuctiva clear without scleral icterus. Mouth:  Oral mucosa pink and moist. Good dentition. No lesions. Heart: Normal rate and rhythm, s1 and s2 heart sounds present.  Lungs: Clear lung sounds in all lobes. Respirations equal and unlabored. Abdomen:  +BS, soft, non-tender and non-distended. No rebound or guarding. No HSM or masses noted. Derm: No palmar erythema or jaundice Msk:  Symmetrical without gross deformities. Normal posture. Extremities:  Without edema. Neurologic:  Alert and  oriented x4 Psych:  Alert and cooperative. Normal mood and affect.  Invalid input(s): "6 MONTHS"   ASSESSMENT: Brenda Reynolds is a 88 y.o. female presenting today for follow up of diarrhea secondary to collagenous colitis.  Taking colestipol 1-3 times a day, she is having 1-2 BMs per day, usually with softer/mushy stools, occasionally has some constipation. Seems to be doing well with this. Will continue on current regimen  In regards to dysphagia, she is still having some symptoms, usually in the evening and after she eats dinner where she feels a sensation of something stuck in her throat, at the sternal notch, sometimes can relax and sensation will resolve, other times she may regurgitate up some foamy, white liquid. She denies heartburn or nausea. No episodes of food impaction. Notably had hiatal hernia of 4cm on recent EGD in 2022, at that time felt that foods and liquids were sticking in mid chest. Query if current symptoms are more of a globus sensation from silent reflux. Will stop famotidine and start omeprazole 40mg  daily, she should continue to chew foods thoroughly. Recommend she call with an update on symptoms in about 4 weeks, if no improvement, may consider proceeding with CT imaging to further evaluate hiatal hernia.    Patient denies melena,  hematochezia, nausea, vomiting, odyonophagia, early satiety or weight loss.    PLAN:  Stop famotidine  2. Start omeprazole 40mg  daily 3. Pt to let me know if she has continued dysphagia after 1 month on PPI  4. Consider CT chest if continuing to have dyspagia  5. Chewing precautions   All questions were answered, patient verbalized understanding and is in agreement with plan as outlined above.   Follow Up: 1 year   Lyndal Reggio L. Alver Sorrow, MSN, APRN, AGNP-C Adult-Gerontology Nurse Practitioner [facility]

## 2022-06-06 NOTE — Patient Instructions (Signed)
Stop famotidine Please start omeprazole 40mg  once daily, take this in the morning before eating Continue your colestipol Please call with an update in about 1 month on how your swallowing is doing, as we may need to do further evaluation if you are still having issues on the omeprazole  Follow up 1 year

## 2022-06-11 ENCOUNTER — Encounter (INDEPENDENT_AMBULATORY_CARE_PROVIDER_SITE_OTHER): Payer: Self-pay | Admitting: Ophthalmology

## 2022-06-11 ENCOUNTER — Ambulatory Visit (INDEPENDENT_AMBULATORY_CARE_PROVIDER_SITE_OTHER): Payer: Medicare Other | Admitting: Ophthalmology

## 2022-06-11 DIAGNOSIS — H43391 Other vitreous opacities, right eye: Secondary | ICD-10-CM | POA: Insufficient documentation

## 2022-06-11 DIAGNOSIS — H34811 Central retinal vein occlusion, right eye, with macular edema: Secondary | ICD-10-CM

## 2022-06-11 DIAGNOSIS — H353132 Nonexudative age-related macular degeneration, bilateral, intermediate dry stage: Secondary | ICD-10-CM | POA: Diagnosis not present

## 2022-06-11 DIAGNOSIS — H43811 Vitreous degeneration, right eye: Secondary | ICD-10-CM | POA: Diagnosis not present

## 2022-06-11 NOTE — Assessment & Plan Note (Signed)
OU stable overall

## 2022-06-11 NOTE — Progress Notes (Addendum)
06/11/2022     CHIEF COMPLAINT Patient presents for  Chief Complaint  Patient presents with   Central Retinal Vein Occlusion      HISTORY OF PRESENT ILLNESS: Brenda Reynolds is a 88 y.o. female who presents to the clinic today for:   HPI   Central retinal vein occlusion w/macular edema of right eye 3 mths dilate od avastin oct Pt states her vision has been stable Pt denies any new floaters or FOL Pt states she feels like something is in her right eye and it seems to her that a she has blurred spot in her right eye.  Last edited by Brenda Reynolds, CMA on 06/11/2022  1:55 PM.      Referring physician: Toma Deiters, MD [address] [place],  [place] [address]  HISTORICAL INFORMATION:   Selected notes from the MEDICAL RECORD NUMBER       CURRENT MEDICATIONS: No current outpatient medications on file. (Ophthalmic Drugs)   No current facility-administered medications for this visit. (Ophthalmic Drugs)   Current Outpatient Medications (Other)  Medication Sig   acetaminophen (TYLENOL) 500 MG tablet Take 500-1,000 mg by mouth every 6 (six) hours as needed for moderate pain. (Patient not taking: Reported on 06/06/2022)   alendronate (FOSAMAX) 70 MG tablet Take 70 mg by mouth every Tuesday.   calcium-vitamin D (OSCAL WITH D) 500-200 MG-UNIT tablet Take 1 tablet by mouth 3 (three) times daily.   colestipol (COLESTID) 1 g tablet TAKE ONE TABLET BY MOUTH EVERY MORNING, AT NOON AND AT BEDTIME   famotidine (PEPCID) 20 MG tablet Take 20 mg by mouth daily.   folic acid (FOLVITE) 400 MCG tablet Take 400 mcg by mouth daily.   gabapentin (NEURONTIN) 300 MG capsule Take 300 mg by mouth 3 (three) times daily.   glucosamine-chondroitin 500-400 MG tablet Take 1 tablet by mouth daily.   HYDROcodone-acetaminophen (NORCO/VICODIN) 5-325 MG tablet Take 1 tablet by mouth 2 (two) times daily as needed for moderate pain.   losartan (COZAAR) 100 MG tablet Take 100 mg by mouth daily.   Multiple  Vitamin (MULTIVITAMIN) tablet Take 1 tablet by mouth daily.   Omega-3 Fatty Acids (FISH OIL) 1000 MG CAPS Take 2,000 mg by mouth daily.   omeprazole (PRILOSEC) 40 MG capsule Take 1 capsule (40 mg total) by mouth daily.   vitamin B-12 (CYANOCOBALAMIN) 500 MCG tablet Take 500 mcg by mouth daily.   vitamin C (ASCORBIC ACID) 500 MG tablet Take 1 tablet (500 mg total) by mouth daily.   No current facility-administered medications for this visit. (Other)      REVIEW OF SYSTEMS: ROS   Negative for: Constitutional, Gastrointestinal, Neurological, Skin, Genitourinary, Musculoskeletal, HENT, Endocrine, Cardiovascular, Eyes, Respiratory, Psychiatric, Allergic/Imm, Heme/Lymph Last edited by Erling Cruz D, CMA on 06/11/2022  1:55 PM.       ALLERGIES Allergies  Allergen Reactions   Lidocaine Rash    Patch    PAST MEDICAL HISTORY Past Medical History:  Diagnosis Date   GERD (gastroesophageal reflux disease)    Hypertension    Intertrochanteric fracture of left femur (HCC)    Osteoporosis    Scoliosis    Past Surgical History:  Procedure Laterality Date   BIOPSY  03/09/2021   Procedure: BIOPSY;  Surgeon: Brenda Frame, MD;  [facility];  Service: Gastroenterology;;   CATARACT EXTRACTION     COLONOSCOPY     Per patient this was done by Dr. Teena Dunk   COLONOSCOPY WITH PROPOFOL  N/A 03/09/2021   Procedure: COLONOSCOPY WITH PROPOFOL;  Surgeon: Brenda Frame, MD;  [facility];  Service: Gastroenterology;  Laterality: N/A;  10:50   DILATION AND CURETTAGE OF UTERUS     ESOPHAGEAL DILATION N/A 03/09/2021   Procedure: ESOPHAGEAL DILATION;  Surgeon: Brenda Frame, MD;  [facility];  Service: Gastroenterology;  Laterality: N/A;   ESOPHAGOGASTRODUODENOSCOPY (EGD) WITH PROPOFOL N/A 03/09/2021   Procedure: ESOPHAGOGASTRODUODENOSCOPY (EGD) WITH PROPOFOL;  Surgeon: Brenda Frame, MD;  [facility];  Service:  Gastroenterology;  Laterality: N/A;   EYE SURGERY     Bilateral cataracts   INTRAMEDULLARY (IM) NAIL INTERTROCHANTERIC Left 07/04/2019   Procedure: INTRAMEDULLARY (IM) NAIL INTERTROCHANTRIC;  Surgeon: Brenda Frederic, MD;  Location: [facility];  Service: Orthopedics;  Laterality: Left;   TONSILLECTOMY     UPPER GASTROINTESTINAL ENDOSCOPY     Done by Dr. Gabriel Reynolds    FAMILY HISTORY Family History  Problem Relation Age of Onset   Cancer Mother     SOCIAL HISTORY Social History   Tobacco Use   Smoking status: Former    Passive exposure: Past   Smokeless tobacco: Never  Vaping Use   Vaping Use: Never used  Substance Use Topics   Alcohol use: Yes    Alcohol/week: 1.0 standard drink of alcohol    Types: 1 Glasses of wine per week   Drug use: Not Currently         OPHTHALMIC EXAM:  Base Eye Exam     Visual Acuity (ETDRS)       Right Left   Dist cc 20/60 -2 20/30 +2    Correction: Glasses         Tonometry (Tonopen, 2:01 PM)       Right Left   Pressure 17 14         Pupils       Pupils React APD   Right PERRL Brisk None   Left PERRL Brisk None         Visual Fields       Left Right    Full Full         Extraocular Movement       Right Left    Ortho Ortho    -- -- --  --  --  -- -- --   -- -- --  --  --  -- -- --           Neuro/Psych     Oriented x3: Yes   Mood/Affect: Normal         Dilation     Right eye: 2.5% Phenylephrine, 1.0% Mydriacyl @ 1:56 PM           Slit Lamp and Fundus Exam     External Exam       Right Left   External Normal Normal         Slit Lamp Exam       Right Left   Lids/Lashes Normal Normal   Conjunctiva/Sclera White and quiet White and quiet   Cornea Clear Clear   Anterior Chamber Deep and quiet Deep and quiet   Iris Round and reactive Round and reactive   Lens Posterior chamber intraocular lens Posterior chamber intraocular lens   Anterior Vitreous Normal Normal         Fundus  Exam       Right Left   Posterior Vitreous Central vitreous floaters no change Posterior vitreous detachment   Disc Collaterals  on the nerve, no NVD Small collateral superiorly   C/D Ratio 0.25 0.25   Macula Macular atrophy, Microaneurysms, Epiretinal membrane, Cystoid macular edema less Hard drusen, Early age related macular degeneration   Vessels Old CRVO, no N/V Normal   Periphery Normal Normal            IMAGING AND PROCEDURES  Imaging and Procedures for 06/13/22  OCT, Retina - OU - Both Eyes       Right Eye Quality was poor. Scan locations included subfoveal. Progression has improved. Findings include abnormal foveal contour, cystoid macular edema, epiretinal membrane.   Left Eye Quality was good. Scan locations included subfoveal. Central Foveal Thickness: 260. Progression has been stable. Findings include normal foveal contour.   Notes Decreased CME OD at 9 weeks follow-up post injection Avastin for central retinal vein occlusion induced CME.  We will repeat injection today in the right eye and maintain and repeat evaluation in 9 weeks     Intravitreal Injection, Pharmacologic Agent - OD - Right Eye       Time Out 06/11/2022. 2:50 PM. Confirmed correct patient, procedure, site, and patient consented.   Anesthesia Topical anesthesia was used. Anesthetic medications included Lidocaine 4%.   Procedure Preparation included 5% betadine to ocular surface, 10% betadine to eyelids, Tobramycin 0.3%. A 30 gauge needle was used.   Injection: 1.25 mg Bevacizumab 1.25mg /0.35ml   Route: Intravitreal, Site: Right Eye   NDC: [id number], Lot: [id number], Expiration date: 08/14/2022   Post-op Post injection exam found visual acuity of at least counting fingers. The patient tolerated the procedure well. There were no complications. The patient received written and verbal post procedure care education. Post injection medications included ocuflox.               ASSESSMENT/PLAN:  Central retinal vein occlusion with macular edema of right eye Stable overall at 9-week interval.  We will repeat injection today and reevaluate again in 9 to 10 weeks  Intermediate stage nonexudative age-related macular degeneration of both eyes OU stable overall  Vitreous floaters of right eye Stable overall continue to monitor  Posterior vitreous detachment of right eye OD, no retinal holes or tears     ICD-10-CM   1. Central retinal vein occlusion with macular edema of right eye  H34.8110 OCT, Retina - OU - Both Eyes    Intravitreal Injection, Pharmacologic Agent - OD - Right Eye    Bevacizumab (AVASTIN) SOLN 1.25 mg    2. Intermediate stage nonexudative age-related macular degeneration of both eyes  H35.3132     3. Vitreous floaters of right eye  H43.391     4. Posterior vitreous detachment of right eye  H43.811       1.  OD, repeat injection Avastin today to maintain and keep stable  2.  Vitreous floaters may need to attention if these persist or worsen  3.  Ophthalmic Meds Ordered this visit:  Meds ordered this encounter  Medications   Bevacizumab (AVASTIN) SOLN 1.25 mg       Return in about 10 weeks (around 08/20/2022) for dilate, OD, AVASTIN OCT.  There are no Patient Instructions on file for this visit.   Explained the diagnoses, plan, and follow up with the patient and they expressed understanding.  Patient expressed understanding of the importance of proper follow up care.   Alford Highland Marisa Hufstetler M.D. Diseases & Surgery of the Retina and Vitreous [facility] 06/13/22     Abbreviations: M myopia (nearsighted);  A astigmatism; H hyperopia (farsighted); P presbyopia; Mrx spectacle prescription;  CTL contact lenses; OD right eye; OS left eye; OU both eyes  XT exotropia; ET esotropia; PEK punctate epithelial keratitis; PEE punctate epithelial erosions; DES dry eye syndrome; MGD meibomian gland dysfunction; ATs artificial  tears; PFAT's preservative free artificial tears; NSC nuclear sclerotic cataract; PSC posterior subcapsular cataract; ERM epi-retinal membrane; PVD posterior vitreous detachment; RD retinal detachment; DM diabetes mellitus; DR diabetic retinopathy; NPDR non-proliferative diabetic retinopathy; PDR proliferative diabetic retinopathy; CSME clinically significant macular edema; DME diabetic macular edema; dbh dot blot hemorrhages; CWS cotton wool spot; POAG primary open angle glaucoma; C/D cup-to-disc ratio; HVF humphrey visual field; GVF goldmann visual field; OCT optical coherence tomography; IOP intraocular pressure; BRVO Branch retinal vein occlusion; CRVO central retinal vein occlusion; CRAO central retinal artery occlusion; BRAO branch retinal artery occlusion; RT retinal tear; SB scleral buckle; PPV pars plana vitrectomy; VH Vitreous hemorrhage; PRP panretinal laser photocoagulation; IVK intravitreal kenalog; VMT vitreomacular traction; MH Macular hole;  NVD neovascularization of the disc; NVE neovascularization elsewhere; AREDS age related eye disease study; ARMD age related macular degeneration; POAG primary open angle glaucoma; EBMD epithelial/anterior basement membrane dystrophy; ACIOL anterior chamber intraocular lens; IOL intraocular lens; PCIOL posterior chamber intraocular lens; Phaco/IOL phacoemulsification with intraocular lens placement; PRK photorefractive keratectomy; LASIK laser assisted in situ keratomileusis; HTN hypertension; DM diabetes mellitus; COPD chronic obstructive pulmonary disease

## 2022-06-11 NOTE — Assessment & Plan Note (Signed)
Stable overall at 9-week interval.  We will repeat injection today and reevaluate again in 9 to 10 weeks

## 2022-06-11 NOTE — Assessment & Plan Note (Signed)
Stable overall continue to monitor

## 2022-06-11 NOTE — Assessment & Plan Note (Signed)
OD, no retinal holes or tears

## 2022-06-13 DIAGNOSIS — H34811 Central retinal vein occlusion, right eye, with macular edema: Secondary | ICD-10-CM | POA: Diagnosis not present

## 2022-06-13 MED ORDER — BEVACIZUMAB CHEMO INJECTION 1.25MG/0.05ML SYRINGE FOR KALEIDOSCOPE
1.2500 mg | INTRAVITREAL | Status: AC | PRN
  Administered 2022-06-13: 1.25 mg via INTRAVITREAL

## 2022-06-13 NOTE — Addendum Note (Signed)
Addended by: Fawn Kirk A on: 06/13/2022 02:10 PM   Modules accepted: Orders

## 2022-07-16 ENCOUNTER — Telehealth (INDEPENDENT_AMBULATORY_CARE_PROVIDER_SITE_OTHER): Payer: Self-pay

## 2022-07-16 NOTE — Telephone Encounter (Signed)
Per patient she says she has contacted pcp and will have him continue prescribing the famotidine as he has in the past.

## 2022-07-16 NOTE — Telephone Encounter (Signed)
Omeprazole not working, and she says she will discontinue this and will have pcp refill her famotine as this is the only thing that works.

## 2022-08-20 ENCOUNTER — Encounter (INDEPENDENT_AMBULATORY_CARE_PROVIDER_SITE_OTHER): Payer: Medicare Other | Admitting: Ophthalmology

## 2022-08-27 DIAGNOSIS — J449 Chronic obstructive pulmonary disease, unspecified: Secondary | ICD-10-CM | POA: Diagnosis not present

## 2022-08-27 DIAGNOSIS — M25619 Stiffness of unspecified shoulder, not elsewhere classified: Secondary | ICD-10-CM | POA: Diagnosis not present

## 2022-08-27 DIAGNOSIS — I10 Essential (primary) hypertension: Secondary | ICD-10-CM | POA: Diagnosis not present

## 2022-08-27 DIAGNOSIS — R32 Unspecified urinary incontinence: Secondary | ICD-10-CM | POA: Diagnosis not present

## 2022-09-03 NOTE — Progress Notes (Signed)
[facility] Note  09/17/2022     CHIEF COMPLAINT Patient presents for Retina Evaluation   HISTORY OF PRESENT ILLNESS: Brenda Reynolds is a 88 y.o. female who presents to the clinic today for:   HPI     Retina Evaluation   In both eyes.  This started 7 years ago.  Duration of 14 weeks.  I, the attending physician,  performed the HPI with the patient and updated documentation appropriately.        Comments   Pt here ret eval, 14 weeks since last OV on 06/11/22 w/ Dr. Zadie Rhine. Received IVA OD q 9-10 weeks. Pt states she can tell VA is a little blurry. Has been seeing Dr. Zadie Rhine since 2016. Same rx specs since 2016.       Last edited by Bernarda Caffey, MD on 09/18/2022 12:28 AM.    Pt is a previous Dr. Zadie Rhine pt, here for insurance reasons, pt hasn't seen him since 09.12.23, she received IVA OD at that appt, pt feels like over the past week or so her right eye seems blurrier  Referring physician: Neale Burly, MD [place],  [place] [id number]  HISTORICAL INFORMATION:   Selected notes from the MEDICAL RECORD NUMBER Referred by Dr. Zadie Rhine due to insurance network LEE:  Ocular Hx- PMH-    CURRENT MEDICATIONS: No current outpatient medications on file. (Ophthalmic Drugs)   No current facility-administered medications for this visit. (Ophthalmic Drugs)   Current Outpatient Medications (Other)  Medication Sig   alendronate (FOSAMAX) 70 MG tablet Take 70 mg by mouth every Tuesday.   calcium-vitamin D (OSCAL WITH D) 500-200 MG-UNIT tablet Take 1 tablet by mouth 3 (three) times daily.   colestipol (COLESTID) 1 g tablet TAKE ONE TABLET BY MOUTH EVERY MORNING, AT NOON AND AT BEDTIME   famotidine (PEPCID) 20 MG tablet Take 20 mg by mouth daily.   folic acid (FOLVITE) 185 MCG tablet Take 400 mcg by mouth daily.   gabapentin (NEURONTIN) 300 MG capsule Take 300 mg by mouth 3 (three) times daily.   glucosamine-chondroitin 500-400 MG tablet  Take 1 tablet by mouth daily.   HYDROcodone-acetaminophen (NORCO/VICODIN) 5-325 MG tablet Take 1 tablet by mouth 2 (two) times daily as needed for moderate pain.   losartan (COZAAR) 100 MG tablet Take 100 mg by mouth daily.   Multiple Vitamin (MULTIVITAMIN) tablet Take 1 tablet by mouth daily.   Omega-3 Fatty Acids (FISH OIL) 1000 MG CAPS Take 2,000 mg by mouth daily.   omeprazole (PRILOSEC) 40 MG capsule Take 1 capsule (40 mg total) by mouth daily.   vitamin B-12 (CYANOCOBALAMIN) 500 MCG tablet Take 500 mcg by mouth daily.   vitamin C (ASCORBIC ACID) 500 MG tablet Take 1 tablet (500 mg total) by mouth daily.   acetaminophen (TYLENOL) 500 MG tablet Take 500-1,000 mg by mouth every 6 (six) hours as needed for moderate pain. (Patient not taking: Reported on 06/06/2022)   No current facility-administered medications for this visit. (Other)   REVIEW OF SYSTEMS: ROS   Positive for: Gastrointestinal, Cardiovascular, Eyes Negative for: Constitutional, Neurological, Skin, Genitourinary, Musculoskeletal, HENT, Endocrine, Respiratory, Psychiatric, Allergic/Imm, Heme/Lymph Last edited by Kingsley Spittle, COT on 09/17/2022 12:56 PM.     ALLERGIES Allergies  Allergen Reactions   Lidocaine Rash    Patch   PAST MEDICAL HISTORY Past Medical History:  Diagnosis Date   GERD (gastroesophageal reflux disease)    Hypertension    Intertrochanteric  fracture of left femur ([place])    Osteoporosis    Scoliosis    Past Surgical History:  Procedure Laterality Date   BIOPSY  03/09/2021   Procedure: BIOPSY;  Surgeon: Harvel Quale, MD;  [facility];  Service: Gastroenterology;;   CATARACT EXTRACTION     COLONOSCOPY     Per patient this was done by Dr. Britta Mccreedy   COLONOSCOPY WITH PROPOFOL N/A 03/09/2021   Procedure: COLONOSCOPY WITH PROPOFOL;  Surgeon: Harvel Quale, MD;  [facility]: [facility];  Service: Gastroenterology;  Laterality: N/A;  10:50   DILATION AND CURETTAGE  OF UTERUS     ESOPHAGEAL DILATION N/A 03/09/2021   Procedure: ESOPHAGEAL DILATION;  Surgeon: Harvel Quale, MD;  [facility];  Service: Gastroenterology;  Laterality: N/A;   ESOPHAGOGASTRODUODENOSCOPY (EGD) WITH PROPOFOL N/A 03/09/2021   Procedure: ESOPHAGOGASTRODUODENOSCOPY (EGD) WITH PROPOFOL;  Surgeon: Harvel Quale, MD;  [facility];  Service: Gastroenterology;  Laterality: N/A;   EYE SURGERY     Bilateral cataracts   INTRAMEDULLARY (IM) NAIL INTERTROCHANTERIC Left 07/04/2019   Procedure: INTRAMEDULLARY (IM) NAIL INTERTROCHANTRIC;  Surgeon: Rod Can, MD;  Location: [facility];  Service: Orthopedics;  Laterality: Left;   TONSILLECTOMY     UPPER GASTROINTESTINAL ENDOSCOPY     Done by Dr. Anthony Sar   FAMILY HISTORY Family History  Problem Relation Age of Onset   Cancer Mother    SOCIAL HISTORY Social History   Tobacco Use   Smoking status: Former    Passive exposure: Past   Smokeless tobacco: Never  Vaping Use   Vaping Use: Never used  Substance Use Topics   Alcohol use: Yes    Alcohol/week: 1.0 standard drink of alcohol    Types: 1 Glasses of wine per week   Drug use: Not Currently       OPHTHALMIC EXAM:  Base Eye Exam     Visual Acuity (Snellen - Linear)       Right Left   Dist cc 20/150 +1 20/40   Dist ph cc NI 20/25 -2    Correction: Glasses         Tonometry (Tonopen, 1:16 PM)       Right Left   Pressure 15 29         Tonometry #2 (Tonopen, 1:17 PM)       Right Left   Pressure  26         Tonometry #3 (Tonopen, 1:17 PM)       Right Left   Pressure  26         Tonometry Comments   Given 1 gtt of Brim and Dorz OD MS        Pupils       Pupils Dark Light Shape React APD   Right PERRL 2 2 Round Minimal None   Left PERRL 2 2 Round Minimal None         Visual Fields (Counting fingers)       Left Right    Full Full         Extraocular Movement       Right Left    Full, Ortho  Full, Ortho         Neuro/Psych     Oriented x3: Yes   Mood/Affect: Normal         Dilation     Both eyes: 1.0% Mydriacyl, 2.5% Phenylephrine @ 1:21 PM  Slit Lamp and Fundus Exam     External Exam       Right Left   External Normal Normal         Slit Lamp Exam       Right Left   Lids/Lashes Dermatochalasis - upper lid, Meibomian gland dysfunction Dermatochalasis - upper lid, Meibomian gland dysfunction   Conjunctiva/Sclera White and quiet White and quiet   Cornea arcus, 2+ Punctate epithelial erosions, Descemet's folds, tear film debris arcus, 2-3+ Punctate epithelial erosions, trace Descemet's folds, tear film debris   Anterior Chamber deep and clear deep and clear   Iris Round and dilated Round and dilated   Lens 3 piece PC IOL in good position with open PC 3 piece PC IOL in good position with open PC         Fundus Exam       Right Left   Posterior Vitreous Vitreous syneresis Posterior vitreous detachment   Disc Pink and Sharp, +hyperemia mild Pallor, Sharp rim, central hyperemia, mild PPA   C/D Ratio 0.25 0.25   Macula hazy view, Blunted foveal reflex, +edema, scattered IRH, +drusen Flat, Good foveal reflex, RPE mottling and clumping, Drusen, No heme or edema   Vessels attenuated, Tortuous, old CRVO attenuated, Tortuous   Periphery Attached, mild reticular degeneration Attached, mild reticular degen           Refraction     Wearing Rx       Sphere Cylinder Axis Add   Right -0.75 +2.00 178 +2.50   Left -0.50 +2.00 170 +2.50         Manifest Refraction       Sphere Cylinder Axis Dist VA   Right -0.25 +2.00 180 20/100-3   Left -0.50 +2.00 180 20/25-2           IMAGING AND PROCEDURES  Imaging and Procedures for 09/17/2022  OCT, Retina - OU - Both Eyes       Right Eye Quality was poor. Scan locations included subfoveal. Central Foveal Thickness: 617. Progression has no prior data. Findings include abnormal foveal  contour, intraretinal fluid, subretinal fluid, outer retinal atrophy (Central edema with IRF and shallow SRF centrally).   Left Eye Quality was good. Scan locations included subfoveal. Central Foveal Thickness: 255. Progression has no prior data. Findings include normal foveal contour, no IRF, no SRF, retinal drusen .   Notes *Images captured and stored on drive  Diagnosis / Impression:  OD: Central edema with IRF and shallow SRF centrally OS: NFP, no IRF/SRF  Clinical management:  See below  Abbreviations: NFP - Normal foveal profile. CME - cystoid macular edema. PED - pigment epithelial detachment. IRF - intraretinal fluid. SRF - subretinal fluid. EZ - ellipsoid zone. ERM - epiretinal membrane. ORA - outer retinal atrophy. ORT - outer retinal tubulation. SRHM - subretinal hyper-reflective material. IRHM - intraretinal hyper-reflective material      Intravitreal Injection, Pharmacologic Agent - OD - Right Eye       Time Out 09/17/2022. 2:13 PM. Confirmed correct patient, procedure, site, and patient consented.   Anesthesia Topical anesthesia was used. Anesthetic medications included Lidocaine 2%, Proparacaine 0.5%.   Procedure Preparation included 5% betadine to ocular surface, eyelid speculum. A supplied needle was used.   Injection: 1.25 mg Bevacizumab 1.28m/0.05ml   Route: Intravitreal, Site: Right Eye   NDC: 50242-060-01, Lot: [id number] , Expiration date: 10/30/2022   Post-op Post injection exam found visual acuity of at least counting fingers. The patient tolerated the procedure  well. There were no complications. The patient received written and verbal post procedure care education.            ASSESSMENT/PLAN:    ICD-10-CM   1. Central retinal vein occlusion with macular edema of right eye  H34.8110 OCT, Retina - OU - Both Eyes    Intravitreal Injection, Pharmacologic Agent - OD - Right Eye    Bevacizumab (AVASTIN) SOLN 1.25 mg    2. Intermediate stage  nonexudative age-related macular degeneration of both eyes  H35.3132     3. Essential hypertension  I10     4. Hypertensive retinopathy of both eyes  H35.033     5. Pseudophakia of both eyes  Z96.1     6. Dry eyes  H04.123      CRVO w/ CME, OD  - previous Dr. Zadie Rhine pt here for insurance reasons  - history of IVA OD x13 (last one 09.14.23 -- 14 wks)  - The natural history of retinal vein occlusion and macular edema and treatment options including observation, laser photocoagulation, and intravitreal antiVEGF injection with Avastin and Lucentis and Eylea and intravitreal injection of steroids with triamcinolone and Ozurdex and the complications of these procedures including loss of vision, infection, cataract, glaucoma, and retinal detachment were discussed with patient.  - Specifically discussed stabilization with anti-VEGF agents and increased potential for visual improvements.  Also discussed need for frequent follow up and potentially multiple injections given the chronic nature of the disease process  - OCT shows OD: Central edema with IRF and shallow SRF centrally at 14 wks since last IVA OD  - recommend IVA OD #14 today, with follow up in 6 weeks to gauge response  - RBA of procedure discussed, questions answered - IVA informed consent obtained and signed 12.19.23  - see procedure note - f/u 6 wks -- DFE/OCT, possible injection  2. Age related macular degeneration, non-exudative, OU  - mild-intermediate stage  - The incidence, anatomy, and pathology of dry AMD, risk of progression, and the AREDS and AREDS 2 studies including smoking risks discussed with patient.  - Recommend amsler grid monitoring  3,4. Hypertensive retinopathy OU - discussed importance of tight BP control - monitor  5. Pseudophakia OU  - s/p CE/IOL OU  - IOLs in good position, doing well  - monitor  6. Dry eyes OU - recommend artificial tears and lubricating ointment as needed  Ophthalmic Meds Ordered  this visit:  Meds ordered this encounter  Medications   Bevacizumab (AVASTIN) SOLN 1.25 mg     Return in about 6 weeks (around 10/29/2022) for f/u CRVO OD, DFE, OCT.  There are no Patient Instructions on file for this visit.   Explained the diagnoses, plan, and follow up with the patient and they expressed understanding.  Patient expressed understanding of the importance of proper follow up care.   This document serves as a record of services personally performed by Gardiner Sleeper, MD, PhD. It was created on their behalf by Renaldo Reel, [place] an ophthalmic technician. The creation of this record is the provider's dictation and/or activities during the visit.    Electronically signed by:  Renaldo Reel, COT  12.05.23 12:36 AM  This document serves as a record of services personally performed by Gardiner Sleeper, MD, PhD. It was created on their behalf by San Jetty. Owens Shark, OA an ophthalmic technician. The creation of this record is the provider's dictation and/or activities during the visit.    Electronically signed by: San Jetty.  Owens Shark, [place] 12.19.2023 12:36 AM  Gardiner Sleeper, M.D., Ph.D. Diseases & Surgery of the Retina and Vitreous [facility]  I have reviewed the above documentation for accuracy and completeness, and I agree with the above. Gardiner Sleeper, M.D., Ph.D. 09/18/22 12:36 AM  Abbreviations: M myopia (nearsighted); A astigmatism; H hyperopia (farsighted); P presbyopia; Mrx spectacle prescription;  CTL contact lenses; OD right eye; OS left eye; OU both eyes  XT exotropia; ET esotropia; PEK punctate epithelial keratitis; PEE punctate epithelial erosions; DES dry eye syndrome; MGD meibomian gland dysfunction; ATs artificial tears; PFAT's preservative free artificial tears; [place] nuclear sclerotic cataract; PSC posterior subcapsular cataract; ERM epi-retinal membrane; PVD posterior vitreous detachment; RD retinal detachment; DM diabetes mellitus; DR  diabetic retinopathy; NPDR non-proliferative diabetic retinopathy; PDR proliferative diabetic retinopathy; CSME clinically significant macular edema; DME diabetic macular edema; dbh dot blot hemorrhages; CWS cotton wool spot; POAG primary open angle glaucoma; C/D cup-to-disc ratio; HVF humphrey visual field; GVF goldmann visual field; OCT optical coherence tomography; IOP intraocular pressure; BRVO Branch retinal vein occlusion; CRVO central retinal vein occlusion; CRAO central retinal artery occlusion; BRAO branch retinal artery occlusion; RT retinal tear; SB scleral buckle; PPV pars plana vitrectomy; VH Vitreous hemorrhage; PRP panretinal laser photocoagulation; IVK intravitreal kenalog; VMT vitreomacular traction; MH Macular hole;  NVD neovascularization of the disc; NVE neovascularization elsewhere; AREDS age related eye disease study; ARMD age related macular degeneration; POAG primary open angle glaucoma; EBMD epithelial/anterior basement membrane dystrophy; ACIOL anterior chamber intraocular lens; IOL intraocular lens; PCIOL posterior chamber intraocular lens; Phaco/IOL phacoemulsification with intraocular lens placement; [place] photorefractive keratectomy; LASIK laser assisted in situ keratomileusis; HTN hypertension; DM diabetes mellitus; COPD chronic obstructive pulmonary disease

## 2022-09-04 ENCOUNTER — Encounter (INDEPENDENT_AMBULATORY_CARE_PROVIDER_SITE_OTHER): Payer: Medicare Other | Admitting: Ophthalmology

## 2022-09-17 ENCOUNTER — Ambulatory Visit (INDEPENDENT_AMBULATORY_CARE_PROVIDER_SITE_OTHER): Payer: Medicare Other | Admitting: Ophthalmology

## 2022-09-17 ENCOUNTER — Encounter (INDEPENDENT_AMBULATORY_CARE_PROVIDER_SITE_OTHER): Payer: Self-pay | Admitting: Ophthalmology

## 2022-09-17 DIAGNOSIS — H04123 Dry eye syndrome of bilateral lacrimal glands: Secondary | ICD-10-CM

## 2022-09-17 DIAGNOSIS — H353132 Nonexudative age-related macular degeneration, bilateral, intermediate dry stage: Secondary | ICD-10-CM

## 2022-09-17 DIAGNOSIS — I10 Essential (primary) hypertension: Secondary | ICD-10-CM

## 2022-09-17 DIAGNOSIS — H43811 Vitreous degeneration, right eye: Secondary | ICD-10-CM

## 2022-09-17 DIAGNOSIS — H348122 Central retinal vein occlusion, left eye, stable: Secondary | ICD-10-CM

## 2022-09-17 DIAGNOSIS — H43391 Other vitreous opacities, right eye: Secondary | ICD-10-CM

## 2022-09-17 DIAGNOSIS — H35033 Hypertensive retinopathy, bilateral: Secondary | ICD-10-CM

## 2022-09-17 DIAGNOSIS — H34811 Central retinal vein occlusion, right eye, with macular edema: Secondary | ICD-10-CM

## 2022-09-17 DIAGNOSIS — Z961 Presence of intraocular lens: Secondary | ICD-10-CM

## 2022-09-17 MED ORDER — BEVACIZUMAB CHEMO INJECTION 1.25MG/0.05ML SYRINGE FOR KALEIDOSCOPE
1.2500 mg | INTRAVITREAL | Status: AC | PRN
  Administered 2022-09-17: 1.25 mg via INTRAVITREAL

## 2022-09-18 ENCOUNTER — Encounter (INDEPENDENT_AMBULATORY_CARE_PROVIDER_SITE_OTHER): Payer: Self-pay | Admitting: Ophthalmology

## 2022-10-15 NOTE — Progress Notes (Signed)
[facility] Note  10/29/2022     CHIEF COMPLAINT Patient presents for Retina Follow Up   HISTORY OF PRESENT ILLNESS: Brenda Reynolds Risk is a 89 y.o. female who presents to the clinic today for:   HPI     Retina Follow Up   Patient presents with  CRVO/BRVO.  In right eye.  This started 6 weeks ago.  I, the attending physician,  performed the HPI with the patient and updated documentation appropriately.        Comments   Patient here for 6 weeks retina follow up for CRVO OD. Patient states vision bee ok. Sees Ok. Got some eye drops for dry eyes. After using drops couldn't see good. After being dilated lights are like a ferris wheel lights. Has FB sensation sometimes.       Last edited by Bernarda Caffey, MD on 10/29/2022  4:36 PM.    Pt states she got Systane for dryness and says the drops burned her   Referring physician: Neale Burly, MD [place],  [place] [address]  HISTORICAL INFORMATION:   Selected notes from the MEDICAL RECORD NUMBER Referred by Dr. Zadie Rhine due to insurance network LEE:  Ocular Hx- PMH-    CURRENT MEDICATIONS: No current outpatient medications on file. (Ophthalmic Drugs)   No current facility-administered medications for this visit. (Ophthalmic Drugs)   Current Outpatient Medications (Other)  Medication Sig   alendronate (FOSAMAX) 70 MG tablet Take 70 mg by mouth every Tuesday.   calcium-vitamin D (OSCAL WITH D) 500-200 MG-UNIT tablet Take 1 tablet by mouth 3 (three) times daily.   colestipol (COLESTID) 1 g tablet TAKE ONE TABLET BY MOUTH EVERY MORNING, AT NOON AND AT BEDTIME   famotidine (PEPCID) 20 MG tablet Take 20 mg by mouth daily.   folic acid (FOLVITE) 809 MCG tablet Take 400 mcg by mouth daily.   gabapentin (NEURONTIN) 300 MG capsule Take 300 mg by mouth 3 (three) times daily.   glucosamine-chondroitin 500-400 MG tablet Take 1 tablet by mouth daily.   HYDROcodone-acetaminophen (NORCO/VICODIN)  5-325 MG tablet Take 1 tablet by mouth 2 (two) times daily as needed for moderate pain.   losartan (COZAAR) 100 MG tablet Take 100 mg by mouth daily.   Multiple Vitamin (MULTIVITAMIN) tablet Take 1 tablet by mouth daily.   Omega-3 Fatty Acids (FISH OIL) 1000 MG CAPS Take 2,000 mg by mouth daily.   omeprazole (PRILOSEC) 40 MG capsule Take 1 capsule (40 mg total) by mouth daily.   vitamin Reynolds-12 (CYANOCOBALAMIN) 500 MCG tablet Take 500 mcg by mouth daily.   vitamin C (ASCORBIC ACID) 500 MG tablet Take 1 tablet (500 mg total) by mouth daily.   acetaminophen (TYLENOL) 500 MG tablet Take 500-1,000 mg by mouth every 6 (six) hours as needed for moderate pain. (Patient not taking: Reported on 06/06/2022)   No current facility-administered medications for this visit. (Other)   REVIEW OF SYSTEMS: ROS   Positive for: Gastrointestinal, Cardiovascular, Eyes Negative for: Constitutional, Neurological, Skin, Genitourinary, Musculoskeletal, HENT, Endocrine, Respiratory, Psychiatric, Allergic/Imm, Heme/Lymph Last edited by Theodore Demark, COA on 10/29/2022  1:57 PM.     ALLERGIES Allergies  Allergen Reactions   Lidocaine Rash    Patch   PAST MEDICAL HISTORY Past Medical History:  Diagnosis Date   GERD (gastroesophageal reflux disease)    Hypertension    Intertrochanteric fracture of left femur ([place])    Osteoporosis    Scoliosis  Past Surgical History:  Procedure Laterality Date   BIOPSY  03/09/2021   Procedure: BIOPSY;  Surgeon: Harvel Quale, MD;  [facility];  Service: Gastroenterology;;   CATARACT EXTRACTION     COLONOSCOPY     Per patient this was done by Dr. Britta Mccreedy   COLONOSCOPY WITH PROPOFOL N/A 03/09/2021   Procedure: COLONOSCOPY WITH PROPOFOL;  Surgeon: Harvel Quale, MD;  [facility]: [facility];  Service: Gastroenterology;  Laterality: N/A;  10:50   DILATION AND CURETTAGE OF UTERUS     ESOPHAGEAL DILATION N/A 03/09/2021   Procedure: ESOPHAGEAL  DILATION;  Surgeon: Harvel Quale, MD;  [facility]: [facility];  Service: Gastroenterology;  Laterality: N/A;   ESOPHAGOGASTRODUODENOSCOPY (EGD) WITH PROPOFOL N/A 03/09/2021   Procedure: ESOPHAGOGASTRODUODENOSCOPY (EGD) WITH PROPOFOL;  Surgeon: Harvel Quale, MD;  [facility];  Service: Gastroenterology;  Laterality: N/A;   EYE SURGERY     Bilateral cataracts   INTRAMEDULLARY (IM) NAIL INTERTROCHANTERIC Left 07/04/2019   Procedure: INTRAMEDULLARY (IM) NAIL INTERTROCHANTRIC;  Surgeon: Rod Can, MD;  Location: [facility];  Service: Orthopedics;  Laterality: Left;   TONSILLECTOMY     UPPER GASTROINTESTINAL ENDOSCOPY     Done by Dr. Anthony Sar   FAMILY HISTORY Family History  Problem Relation Age of Onset   Cancer Mother    SOCIAL HISTORY Social History   Tobacco Use   Smoking status: Former    Passive exposure: Past   Smokeless tobacco: Never  Vaping Use   Vaping Use: Never used  Substance Use Topics   Alcohol use: Yes    Alcohol/week: 1.0 standard drink of alcohol    Types: 1 Glasses of wine per week   Drug use: Not Currently       OPHTHALMIC EXAM:  Base Eye Exam     Visual Acuity (Snellen - Linear)       Right Left   Dist cc 20/80 20/25   Dist ph cc 20/50 -2 20/20 -1    Correction: Glasses         Tonometry (Tonopen, 1:53 PM)       Right Left   Pressure 12 12         Pupils       Dark Light Shape React APD   Right 2 2 Round Minimal None   Left 2 2 Round Minimal None         Visual Fields (Counting fingers)       Left Right    Full Full         Extraocular Movement       Right Left    Full, Ortho Full, Ortho         Neuro/Psych     Oriented x3: Yes   Mood/Affect: Normal         Dilation     Both eyes: 1.0% Mydriacyl, 2.5% Phenylephrine @ 1:53 PM           Slit Lamp and Fundus Exam     External Exam       Right Left   External Normal Normal         Slit Lamp Exam       Right  Left   Lids/Lashes Dermatochalasis - upper lid, Meibomian gland dysfunction Dermatochalasis - upper lid, Meibomian gland dysfunction   Conjunctiva/Sclera White and quiet White and quiet   Cornea arcus, 2+ Punctate epithelial erosions, Descemet's folds, tear film debris arcus, 2-3+ Punctate epithelial erosions, trace Descemet's folds,  tear film debris   Anterior Chamber deep and clear deep and clear   Iris Round and dilated Round and dilated   Lens 3 piece PC IOL in good position with open PC 3 piece PC IOL in good position with open PC   Anterior Vitreous Vitreous syneresis Posterior vitreous detachment         Fundus Exam       Right Left   Disc Pink and Sharp, +hyperemia, vascular loops mild Pallor, Sharp rim, central hyperemia, mild PPA   C/D Ratio 0.25 0.25   Macula hazy view, Blunted foveal reflex, +edema, scattered IRH, +drusen Flat, Good foveal reflex, RPE mottling and clumping, Drusen, No heme or edema   Vessels attenuated, Tortuous, old CRVO attenuated, Tortuous   Periphery Attached, mild reticular degeneration Attached, mild reticular degen           Refraction     Wearing Rx       Sphere Cylinder Axis Add   Right -0.75 +2.00 178 +2.50   Left -0.50 +2.00 170 +2.50           IMAGING AND PROCEDURES  Imaging and Procedures for 10/29/2022  OCT, Retina - OU - Both Eyes       Right Eye Quality was poor. Scan locations included subfoveal. Central Foveal Thickness: 225. Progression has improved. Findings include abnormal foveal contour, intraretinal fluid, subretinal fluid, outer retinal atrophy (Massive interval improvement in IRF, SRF and foveal contour -- just mild cystic changes temporal mac).   Left Eye Quality was good. Scan locations included subfoveal. Central Foveal Thickness: 259. Progression has been stable. Findings include normal foveal contour, no IRF, no SRF, retinal drusen .   Notes *Images captured and stored on drive  Diagnosis / Impression:   OD: Massive interval improvement in IRF, SRF and foveal contour -- just mild cystic changes temporal mac OS: NFP, no IRF/SRF  Clinical management:  See below  Abbreviations: NFP - Normal foveal profile. CME - cystoid macular edema. PED - pigment epithelial detachment. IRF - intraretinal fluid. SRF - subretinal fluid. EZ - ellipsoid zone. ERM - epiretinal membrane. ORA - outer retinal atrophy. ORT - outer retinal tubulation. SRHM - subretinal hyper-reflective material. IRHM - intraretinal hyper-reflective material      Intravitreal Injection, Pharmacologic Agent - OD - Right Eye       Time Out 10/29/2022. 2:57 PM. Confirmed correct patient, procedure, site, and patient consented.   Anesthesia Topical anesthesia was used. Anesthetic medications included Lidocaine 2%, Proparacaine 0.5%.   Procedure Preparation included 5% betadine to ocular surface, eyelid speculum. A (32g) needle was used.   Injection: 1.25 mg Bevacizumab 1.6m/0.05ml   Route: Intravitreal, Site: Right Eye   NDC: 50242-060-01, Lot:: [id number] Expiration date: 11/29/2022   Post-op Post injection exam found visual acuity of at least counting fingers. The patient tolerated the procedure well. There were no complications. The patient received written and verbal post procedure care education.             ASSESSMENT/PLAN:    ICD-10-CM   1. Central retinal vein occlusion with macular edema of right eye  H34.8110 OCT, Retina - OU - Both Eyes    Intravitreal Injection, Pharmacologic Agent - OD - Right Eye    Bevacizumab (AVASTIN) SOLN 1.25 mg    2. Intermediate stage nonexudative age-related macular degeneration of both eyes  H35.3132     3. Essential hypertension  I10     4. Hypertensive retinopathy of both eyes  H35.033  5. Pseudophakia of both eyes  Z96.1     6. Dry eyes  H04.123       CRVO w/ CME, OD  - previous Dr. Zadie Rhine pt here for insurance reasons  - history of IVA OD x13 (last one 09.14.23 --  14 wks) - s/p IVA OD #14 (12.19.23),  - OCT shows OD: Massive interval improvement in IRF, SRF and foveal contour -- just mild cystic changes temporal mac at 6 wks  - recommend IVA OD #15 today 01.30.24, with follow up in 6 weeks  - RBA of procedure discussed, questions answered - IVA informed consent obtained and signed 12.19.23  - see procedure note - f/u 6 wks -- DFE/OCT, possible injection  2. Age related macular degeneration, non-exudative, OU  - mild-intermediate stage  - The incidence, anatomy, and pathology of dry AMD, risk of progression, and the AREDS and AREDS 2 studies including smoking risks discussed with patient.  - Recommend amsler grid monitoring  3,4. Hypertensive retinopathy OU - discussed importance of tight BP control - monitor  5. Pseudophakia OU  - s/p CE/IOL OU  - IOLs in good position, doing well  - monitor  6. Dry eyes OU - recommend artificial tears and lubricating ointment as needed  Ophthalmic Meds Ordered this visit:  Meds ordered this encounter  Medications   Bevacizumab (AVASTIN) SOLN 1.25 mg     Return in about 6 weeks (around 12/10/2022) for f/u CRVO OD, DFE, OCT.  There are no Patient Instructions on file for this visit.   Explained the diagnoses, plan, and follow up with the patient and they expressed understanding.  Patient expressed understanding of the importance of proper follow up care.   This document serves as a record of services personally performed by Gardiner Sleeper, MD, PhD. It was created on their behalf by Renaldo Reel, [place] an ophthalmic technician. The creation of this record is the provider's dictation and/or activities during the visit.    Electronically signed by:  Renaldo Reel, COT  1.16.24 4:37 PM  Gardiner Sleeper, M.D., Ph.D. Diseases & Surgery of the Retina and Vitreous [facility]  I have reviewed the above documentation for accuracy and completeness, and I agree with the above.  Gardiner Sleeper, M.D., Ph.D. 10/29/22 4:38 PM   Abbreviations: M myopia (nearsighted); A astigmatism; H hyperopia (farsighted); P presbyopia; Mrx spectacle prescription;  CTL contact lenses; OD right eye; OS left eye; OU both eyes  XT exotropia; ET esotropia; PEK punctate epithelial keratitis; PEE punctate epithelial erosions; DES dry eye syndrome; MGD meibomian gland dysfunction; ATs artificial tears; PFAT's preservative free artificial tears; [place] nuclear sclerotic cataract; PSC posterior subcapsular cataract; ERM epi-retinal membrane; PVD posterior vitreous detachment; RD retinal detachment; DM diabetes mellitus; DR diabetic retinopathy; NPDR non-proliferative diabetic retinopathy; PDR proliferative diabetic retinopathy; CSME clinically significant macular edema; DME diabetic macular edema; dbh dot blot hemorrhages; CWS cotton wool spot; POAG primary open angle glaucoma; C/D cup-to-disc ratio; HVF humphrey visual field; GVF goldmann visual field; OCT optical coherence tomography; IOP intraocular pressure; BRVO Branch retinal vein occlusion; CRVO central retinal vein occlusion; CRAO central retinal artery occlusion; BRAO branch retinal artery occlusion; RT retinal tear; SB scleral buckle; PPV pars plana vitrectomy; VH Vitreous hemorrhage; PRP panretinal laser photocoagulation; IVK intravitreal kenalog; VMT vitreomacular traction; MH Macular hole;  NVD neovascularization of the disc; NVE neovascularization elsewhere; AREDS age related eye disease study; ARMD age related macular degeneration; POAG primary open angle glaucoma; EBMD epithelial/anterior basement  membrane dystrophy; ACIOL anterior chamber intraocular lens; IOL intraocular lens; PCIOL posterior chamber intraocular lens; Phaco/IOL phacoemulsification with intraocular lens placement; [place] photorefractive keratectomy; LASIK laser assisted in situ keratomileusis; HTN hypertension; DM diabetes mellitus; COPD chronic obstructive pulmonary disease

## 2022-10-17 ENCOUNTER — Other Ambulatory Visit (INDEPENDENT_AMBULATORY_CARE_PROVIDER_SITE_OTHER): Payer: Self-pay | Admitting: Gastroenterology

## 2022-10-17 NOTE — Telephone Encounter (Signed)
Last seen 06/06/22.

## 2022-10-29 ENCOUNTER — Ambulatory Visit (INDEPENDENT_AMBULATORY_CARE_PROVIDER_SITE_OTHER): Payer: Medicare Other | Admitting: Ophthalmology

## 2022-10-29 ENCOUNTER — Encounter (INDEPENDENT_AMBULATORY_CARE_PROVIDER_SITE_OTHER): Payer: Self-pay | Admitting: Ophthalmology

## 2022-10-29 DIAGNOSIS — H35033 Hypertensive retinopathy, bilateral: Secondary | ICD-10-CM | POA: Diagnosis not present

## 2022-10-29 DIAGNOSIS — I10 Essential (primary) hypertension: Secondary | ICD-10-CM

## 2022-10-29 DIAGNOSIS — H04123 Dry eye syndrome of bilateral lacrimal glands: Secondary | ICD-10-CM

## 2022-10-29 DIAGNOSIS — H34811 Central retinal vein occlusion, right eye, with macular edema: Secondary | ICD-10-CM | POA: Diagnosis not present

## 2022-10-29 DIAGNOSIS — H353132 Nonexudative age-related macular degeneration, bilateral, intermediate dry stage: Secondary | ICD-10-CM

## 2022-10-29 DIAGNOSIS — Z961 Presence of intraocular lens: Secondary | ICD-10-CM

## 2022-10-29 MED ORDER — BEVACIZUMAB CHEMO INJECTION 1.25MG/0.05ML SYRINGE FOR KALEIDOSCOPE
1.2500 mg | INTRAVITREAL | Status: AC | PRN
  Administered 2022-10-29: 1.25 mg via INTRAVITREAL

## 2022-11-26 DIAGNOSIS — M25619 Stiffness of unspecified shoulder, not elsewhere classified: Secondary | ICD-10-CM | POA: Diagnosis not present

## 2022-11-26 DIAGNOSIS — Z0000 Encounter for general adult medical examination without abnormal findings: Secondary | ICD-10-CM | POA: Diagnosis not present

## 2022-11-26 DIAGNOSIS — R32 Unspecified urinary incontinence: Secondary | ICD-10-CM | POA: Diagnosis not present

## 2022-11-26 DIAGNOSIS — I10 Essential (primary) hypertension: Secondary | ICD-10-CM | POA: Diagnosis not present

## 2022-11-26 DIAGNOSIS — J449 Chronic obstructive pulmonary disease, unspecified: Secondary | ICD-10-CM | POA: Diagnosis not present

## 2022-11-26 NOTE — Progress Notes (Signed)
[facility] Note  12/10/2022     CHIEF COMPLAINT Patient presents for Retina Follow Up   HISTORY OF PRESENT ILLNESS: Brenda Reynolds is a 89 y.o. female who presents to the clinic today for:   HPI     Retina Follow Up   Patient presents with  CRVO/BRVO.  In right eye.  This started 6 weeks ago.  Duration of 6 weeks.  Since onset it is stable.  I, the attending physician,  performed the HPI with the patient and updated documentation appropriately.        Comments   6 week retina follow up CRVO OD and IVA pt is reporting no vision changes noticed       Last edited by Bernarda Caffey, MD on 12/10/2022  4:17 PM.     Pt feels like she didn't do as well on the eye chart with her right eye today   Referring physician: Neale Burly, MD [place],  [place] [address]  HISTORICAL INFORMATION:   Selected notes from the MEDICAL RECORD NUMBER Referred by Dr. Zadie Rhine due to insurance network LEE:  Ocular Hx- PMH-    CURRENT MEDICATIONS: No current outpatient medications on file. (Ophthalmic Drugs)   No current facility-administered medications for this visit. (Ophthalmic Drugs)   Current Outpatient Medications (Other)  Medication Sig   acetaminophen (TYLENOL) 500 MG tablet Take 500-1,000 mg by mouth every 6 (six) hours as needed for moderate pain. (Patient not taking: Reported on 06/06/2022)   alendronate (FOSAMAX) 70 MG tablet Take 70 mg by mouth every Tuesday.   calcium-vitamin D (OSCAL WITH D) 500-200 MG-UNIT tablet Take 1 tablet by mouth 3 (three) times daily.   colestipol (COLESTID) 1 g tablet TAKE ONE TABLET BY MOUTH EVERY MORNING, AT NOON AND AT BEDTIME   famotidine (PEPCID) 20 MG tablet Take 20 mg by mouth daily.   folic acid (FOLVITE) 536 MCG tablet Take 400 mcg by mouth daily.   gabapentin (NEURONTIN) 300 MG capsule Take 300 mg by mouth 3 (three) times daily.   glucosamine-chondroitin 500-400 MG tablet Take 1 tablet by mouth  daily.   HYDROcodone-acetaminophen (NORCO/VICODIN) 5-325 MG tablet Take 1 tablet by mouth 2 (two) times daily as needed for moderate pain.   losartan (COZAAR) 100 MG tablet Take 100 mg by mouth daily.   Multiple Vitamin (MULTIVITAMIN) tablet Take 1 tablet by mouth daily.   Omega-3 Fatty Acids (FISH OIL) 1000 MG CAPS Take 2,000 mg by mouth daily.   omeprazole (PRILOSEC) 40 MG capsule Take 1 capsule (40 mg total) by mouth daily.   vitamin B-12 (CYANOCOBALAMIN) 500 MCG tablet Take 500 mcg by mouth daily.   vitamin C (ASCORBIC ACID) 500 MG tablet Take 1 tablet (500 mg total) by mouth daily.   No current facility-administered medications for this visit. (Other)   REVIEW OF SYSTEMS: ROS   Positive for: Cardiovascular, Eyes Negative for: Constitutional, Gastrointestinal, Neurological, Skin, Genitourinary, Musculoskeletal, HENT, Endocrine, Respiratory, Psychiatric, Allergic/Imm, Heme/Lymph Last edited by Debbrah Alar, COT on 12/10/2022  2:42 PM.      ALLERGIES Allergies  Allergen Reactions   Lidocaine Rash    Patch   PAST MEDICAL HISTORY Past Medical History:  Diagnosis Date   GERD (gastroesophageal reflux disease)    Hypertension    Intertrochanteric fracture of left femur ([place])    Osteoporosis    Scoliosis    Past Surgical History:  Procedure Laterality Date   BIOPSY  03/09/2021  Procedure: BIOPSY;  Surgeon: Montez Morita, Quillian Quince, MD;  [facility]: [facility];  Service: Gastroenterology;;   CATARACT EXTRACTION     COLONOSCOPY     Per patient this was done by Dr. Britta Mccreedy   COLONOSCOPY WITH PROPOFOL N/A 03/09/2021   Procedure: COLONOSCOPY WITH PROPOFOL;  Surgeon: Harvel Quale, MD;  [facility]: [facility];  Service: Gastroenterology;  Laterality: N/A;  10:50   DILATION AND CURETTAGE OF UTERUS     ESOPHAGEAL DILATION N/A 03/09/2021   Procedure: ESOPHAGEAL DILATION;  Surgeon: Harvel Quale, MD;  [facility]: [facility];  Service: Gastroenterology;   Laterality: N/A;   ESOPHAGOGASTRODUODENOSCOPY (EGD) WITH PROPOFOL N/A 03/09/2021   Procedure: ESOPHAGOGASTRODUODENOSCOPY (EGD) WITH PROPOFOL;  Surgeon: Harvel Quale, MD;  [facility]: [facility];  Service: Gastroenterology;  Laterality: N/A;   EYE SURGERY     Bilateral cataracts   INTRAMEDULLARY (IM) NAIL INTERTROCHANTERIC Left 07/04/2019   Procedure: INTRAMEDULLARY (IM) NAIL INTERTROCHANTRIC;  Surgeon: Rod Can, MD;  Location: [facility];  Service: Orthopedics;  Laterality: Left;   TONSILLECTOMY     UPPER GASTROINTESTINAL ENDOSCOPY     Done by Dr. Anthony Sar   FAMILY HISTORY Family History  Problem Relation Age of Onset   Cancer Mother    SOCIAL HISTORY Social History   Tobacco Use   Smoking status: Former    Passive exposure: Past   Smokeless tobacco: Never  Vaping Use   Vaping Use: Never used  Substance Use Topics   Alcohol use: Yes    Alcohol/week: 1.0 standard drink of alcohol    Types: 1 Glasses of wine per week   Drug use: Not Currently       OPHTHALMIC EXAM:  Base Eye Exam     Visual Acuity (Snellen - Linear)       Right Left   Dist cc 20/70 -1 20/30 +1   Dist ph cc 20/60 NI         Tonometry (Tonopen, 1:29 PM)       Right Left   Pressure 14 15         Pupils       Pupils Dark Light Shape React APD   Right PERRL 2 2 Round Minimal None   Left PERRL 2 2 Round Minimal None         Visual Fields       Left Right    Full Full         Extraocular Movement       Right Left    Full, Ortho Full, Ortho         Neuro/Psych     Oriented x3: Yes   Mood/Affect: Normal         Dilation     Both eyes: 2.5% Phenylephrine @ 1:29 PM           Slit Lamp and Fundus Exam     External Exam       Right Left   External Normal Normal         Slit Lamp Exam       Right Left   Lids/Lashes Dermatochalasis - upper lid, Meibomian gland dysfunction Dermatochalasis - upper lid, Meibomian gland dysfunction    Conjunctiva/Sclera White and quiet White and quiet   Cornea arcus, 2+ Punctate epithelial erosions, Descemet's folds, tear film debris arcus, 1+ Punctate epithelial erosions, trace Descemet's folds, tear film debris   Anterior Chamber deep and clear deep and clear   Iris Round and dilated Round  and dilated   Lens 3 piece PC IOL in good position with open PC 3 piece PC IOL in good position with open PC   Anterior Vitreous Vitreous syneresis Posterior vitreous detachment         Fundus Exam       Right Left   Disc Pink and Sharp, +hyperemia, vascular loops mild Pallor, Sharp rim, central hyperemia, mild PPA   C/D Ratio 0.3 0.2   Macula hazy view, Blunted foveal reflex, +edema and scattered IRH -- improved, +drusen Flat, Good foveal reflex, RPE mottling and clumping, Drusen, No heme or edema   Vessels attenuated, Tortuous, old CRVO attenuated, Tortuous   Periphery Attached, mild reticular degeneration Attached, mild reticular degen           Refraction     Wearing Rx       Sphere Cylinder Axis Add   Right -0.75 +2.00 178 +2.50   Left -0.50 +2.00 170 +2.50           IMAGING AND PROCEDURES  Imaging and Procedures for 12/10/2022  OCT, Retina - OU - Both Eyes       Right Eye Quality was borderline. Scan locations included subfoveal. Central Foveal Thickness: 217. Progression has improved. Findings include abnormal foveal contour, intraretinal fluid, subretinal fluid, outer retinal atrophy (Persistent IRF/cystic changes temporal fovea and macula -- slightly improved, SRF and foveal contour stably improved).   Left Eye Quality was good. Scan locations included subfoveal. Central Foveal Thickness: 255. Progression has been stable. Findings include normal foveal contour, no IRF, no SRF, retinal drusen .   Notes *Images captured and stored on drive  Diagnosis / Impression:  OD: Persistent IRF/cystic changes temporal fovea and macula -- slightly improved, SRF and foveal contour  stably improved OS: NFP, no IRF/SRF  Clinical management:  See below  Abbreviations: NFP - Normal foveal profile. CME - cystoid macular edema. PED - pigment epithelial detachment. IRF - intraretinal fluid. SRF - subretinal fluid. EZ - ellipsoid zone. ERM - epiretinal membrane. ORA - outer retinal atrophy. ORT - outer retinal tubulation. SRHM - subretinal hyper-reflective material. IRHM - intraretinal hyper-reflective material      Intravitreal Injection, Pharmacologic Agent - OD - Right Eye       Time Out 12/10/2022. 2:44 PM. Confirmed correct patient, procedure, site, and patient consented.   Anesthesia Topical anesthesia was used. Anesthetic medications included Lidocaine 2%, Proparacaine 0.5%.   Procedure Preparation included 5% betadine to ocular surface, eyelid speculum. A supplied (32g) needle was used.   Injection: 1.25 mg Bevacizumab 1.25mg /0.39ml   Route: Intravitreal, Site: Right Eye   NDC: H061816, Lot: [id number], Expiration date: 01/11/2023   Post-op Post injection exam found visual acuity of at least counting fingers. The patient tolerated the procedure well. There were no complications. The patient received written and verbal post procedure care education.            ASSESSMENT/PLAN:    ICD-10-CM   1. Central retinal vein occlusion with macular edema of right eye  H34.8110 OCT, Retina - OU - Both Eyes    Intravitreal Injection, Pharmacologic Agent - OD - Right Eye    Bevacizumab (AVASTIN) SOLN 1.25 mg    2. Intermediate stage nonexudative age-related macular degeneration of both eyes  H35.3132     3. Essential hypertension  I10     4. Hypertensive retinopathy of both eyes  H35.033     5. Pseudophakia of both eyes  Z96.1     6. Dry  eyes  H04.123      CRVO w/ CME, OD  - previous Dr. Zadie Rhine pt here for insurance reasons  - history of IVA OD x13 (last one 09.14.23 -- 14 wks) - s/p IVA OD #14 (12.19.23), #15 (01.30.24)  - OCT shows OD: Persistent  IRF/cystic changes temporal fovea and macula -- slightly improved, SRF and foveal contour stably improved at 6 weeks  - recommend IVA OD #16 today 03.12.24, with follow up in 6 weeks  - RBA of procedure discussed, questions answered - IVA informed consent obtained and signed 12.19.23  - see procedure note - f/u 6 wks -- DFE/OCT, possible injection  2. Age related macular degeneration, non-exudative, OU  - mild-intermediate stage  - The incidence, anatomy, and pathology of dry AMD, risk of progression, and the AREDS and AREDS 2 studies including smoking risks discussed with patient.  - Recommend amsler grid monitoring  3,4. Hypertensive retinopathy OU - discussed importance of tight BP control - continue to monitor  5. Pseudophakia OU  - s/p CE/IOL OU  - IOLs in good position, doing well  - continue to monitor  6. Dry eyes OU - recommend artificial tears and lubricating ointment as needed  Ophthalmic Meds Ordered this visit:  Meds ordered this encounter  Medications   Bevacizumab (AVASTIN) SOLN 1.25 mg     Return in about 6 weeks (around 01/21/2023) for f/u CRVO OD, DFE, OCT.  There are no Patient Instructions on file for this visit.   Explained the diagnoses, plan, and follow up with the patient and they expressed understanding.  Patient expressed understanding of the importance of proper follow up care.   This document serves as a record of services personally performed by Gardiner Sleeper, MD, PhD. It was created on their behalf by Renaldo Reel, [place] an ophthalmic technician. The creation of this record is the provider's dictation and/or activities during the visit.    Electronically signed by:  Renaldo Reel, COT  02.27.24 4:18 PM  Gardiner Sleeper, M.D., Ph.D. Diseases & Surgery of the Retina and Vitreous [facility]  I have reviewed the above documentation for accuracy and completeness, and I agree with the above. Gardiner Sleeper, M.D.,  Ph.D. 12/10/22 4:18 PM  Abbreviations: M myopia (nearsighted); A astigmatism; H hyperopia (farsighted); P presbyopia; Mrx spectacle prescription;  CTL contact lenses; OD right eye; OS left eye; OU both eyes  XT exotropia; ET esotropia; PEK punctate epithelial keratitis; PEE punctate epithelial erosions; DES dry eye syndrome; MGD meibomian gland dysfunction; ATs artificial tears; PFAT's preservative free artificial tears; [place] nuclear sclerotic cataract; PSC posterior subcapsular cataract; ERM epi-retinal membrane; PVD posterior vitreous detachment; RD retinal detachment; DM diabetes mellitus; DR diabetic retinopathy; NPDR non-proliferative diabetic retinopathy; PDR proliferative diabetic retinopathy; CSME clinically significant macular edema; DME diabetic macular edema; dbh dot blot hemorrhages; CWS cotton wool spot; POAG primary open angle glaucoma; C/D cup-to-disc ratio; HVF humphrey visual field; GVF goldmann visual field; OCT optical coherence tomography; IOP intraocular pressure; BRVO Branch retinal vein occlusion; CRVO central retinal vein occlusion; CRAO central retinal artery occlusion; BRAO branch retinal artery occlusion; RT retinal tear; SB scleral buckle; PPV pars plana vitrectomy; VH Vitreous hemorrhage; PRP panretinal laser photocoagulation; IVK intravitreal kenalog; VMT vitreomacular traction; MH Macular hole;  NVD neovascularization of the disc; NVE neovascularization elsewhere; AREDS age related eye disease study; ARMD age related macular degeneration; POAG primary open angle glaucoma; EBMD epithelial/anterior basement membrane dystrophy; ACIOL anterior chamber intraocular lens; IOL intraocular  lens; PCIOL posterior chamber intraocular lens; Phaco/IOL phacoemulsification with intraocular lens placement; PRK photorefractive keratectomy; LASIK laser assisted in situ keratomileusis; HTN hypertension; DM diabetes mellitus; COPD chronic obstructive pulmonary disease 

## 2022-12-10 ENCOUNTER — Encounter (INDEPENDENT_AMBULATORY_CARE_PROVIDER_SITE_OTHER): Payer: Self-pay | Admitting: Ophthalmology

## 2022-12-10 ENCOUNTER — Ambulatory Visit (INDEPENDENT_AMBULATORY_CARE_PROVIDER_SITE_OTHER): Payer: Medicare Other | Admitting: Ophthalmology

## 2022-12-10 DIAGNOSIS — I10 Essential (primary) hypertension: Secondary | ICD-10-CM | POA: Diagnosis not present

## 2022-12-10 DIAGNOSIS — H04123 Dry eye syndrome of bilateral lacrimal glands: Secondary | ICD-10-CM

## 2022-12-10 DIAGNOSIS — H353132 Nonexudative age-related macular degeneration, bilateral, intermediate dry stage: Secondary | ICD-10-CM | POA: Diagnosis not present

## 2022-12-10 DIAGNOSIS — H34811 Central retinal vein occlusion, right eye, with macular edema: Secondary | ICD-10-CM | POA: Diagnosis not present

## 2022-12-10 DIAGNOSIS — H35033 Hypertensive retinopathy, bilateral: Secondary | ICD-10-CM | POA: Diagnosis not present

## 2022-12-10 DIAGNOSIS — Z961 Presence of intraocular lens: Secondary | ICD-10-CM

## 2022-12-10 MED ORDER — BEVACIZUMAB CHEMO INJECTION 1.25MG/0.05ML SYRINGE FOR KALEIDOSCOPE
1.2500 mg | INTRAVITREAL | Status: AC | PRN
  Administered 2022-12-10: 1.25 mg via INTRAVITREAL

## 2023-01-07 NOTE — Progress Notes (Signed)
[facility] - Clinic Note  01/21/2023     CHIEF COMPLAINT Patient presents for Retina Follow Up   HISTORY OF PRESENT ILLNESS: Brenda Reynolds is a 89 y.o. female who presents to the clinic today for:   HPI     Retina Follow Up   Patient presents with  CRVO/BRVO.  In right eye.  This started years ago.  Duration of 6 weeks.  Since onset it is stable.  I, the attending physician,  performed the HPI with the patient and updated documentation appropriately.        Comments   Patient feels that the vision looks foggy at times. She has been seeing floaters for the last few days. She has been using At's OU PRN.      Last edited by Rennis Chris, MD on 01/21/2023  4:51 PM.    Pt feels like her right eye vision is worse today, she states the cross in the OCT seemed to be off to the side instead of in the middle like normal   Referring physician: Toma Deiters, MD [address] [place],  [place] [address]  HISTORICAL INFORMATION:   Selected notes from the MEDICAL RECORD NUMBER Referred by Dr. Luciana Axe due to insurance network LEE:  Ocular Hx- PMH-    CURRENT MEDICATIONS: No current outpatient medications on file. (Ophthalmic Drugs)   No current facility-administered medications for this visit. (Ophthalmic Drugs)   Current Outpatient Medications (Other)  Medication Sig   acetaminophen (TYLENOL) 500 MG tablet Take 500-1,000 mg by mouth every 6 (six) hours as needed for moderate pain.   alendronate (FOSAMAX) 70 MG tablet Take 70 mg by mouth every Tuesday.   calcium-vitamin D (OSCAL WITH D) 500-200 MG-UNIT tablet Take 1 tablet by mouth 3 (three) times daily.   colestipol (COLESTID) 1 g tablet TAKE ONE TABLET BY MOUTH EVERY MORNING, AT NOON AND AT BEDTIME   famotidine (PEPCID) 20 MG tablet Take 20 mg by mouth daily.   folic acid (FOLVITE) 400 MCG tablet Take 400 mcg by mouth daily.   gabapentin (NEURONTIN) 300 MG capsule Take 300 mg by mouth 3 (three) times  daily.   glucosamine-chondroitin 500-400 MG tablet Take 1 tablet by mouth daily.   HYDROcodone-acetaminophen (NORCO/VICODIN) 5-325 MG tablet Take 1 tablet by mouth 2 (two) times daily as needed for moderate pain.   losartan (COZAAR) 100 MG tablet Take 100 mg by mouth daily.   Multiple Vitamin (MULTIVITAMIN) tablet Take 1 tablet by mouth daily.   Omega-3 Fatty Acids (FISH OIL) 1000 MG CAPS Take 2,000 mg by mouth daily.   omeprazole (PRILOSEC) 40 MG capsule Take 1 capsule (40 mg total) by mouth daily.   vitamin B-12 (CYANOCOBALAMIN) 500 MCG tablet Take 500 mcg by mouth daily.   vitamin C (ASCORBIC ACID) 500 MG tablet Take 1 tablet (500 mg total) by mouth daily.   No current facility-administered medications for this visit. (Other)   REVIEW OF SYSTEMS: ROS   Positive for: Cardiovascular, Eyes Negative for: Constitutional, Gastrointestinal, Neurological, Skin, Genitourinary, Musculoskeletal, HENT, Endocrine, Respiratory, Psychiatric, Allergic/Imm, Heme/Lymph Last edited by Julieanne Cotton, COT on 01/21/2023  1:39 PM.     ALLERGIES Allergies  Allergen Reactions   Lidocaine Rash    Patch   PAST MEDICAL HISTORY Past Medical History:  Diagnosis Date   GERD (gastroesophageal reflux disease)    Hypertension    Intertrochanteric fracture of left femur    Osteoporosis    Scoliosis  Past Surgical History:  Procedure Laterality Date   BIOPSY  03/09/2021   Procedure: BIOPSY;  Surgeon: Dolores Frameastaneda Mayorga, Daniel, MD;  Location: [facility];  Service: Gastroenterology;;   CATARACT EXTRACTION     COLONOSCOPY     Per patient this was done by Dr. Teena DunkBenson   COLONOSCOPY WITH PROPOFOL N/A 03/09/2021   Procedure: COLONOSCOPY WITH PROPOFOL;  Surgeon: Dolores Frameastaneda Mayorga, Daniel, MD;  Location: [facility];  Service: Gastroenterology;  Laterality: N/A;  10:50   DILATION AND CURETTAGE OF UTERUS     ESOPHAGEAL DILATION N/A 03/09/2021   Procedure: ESOPHAGEAL DILATION;  Surgeon: Dolores Frameastaneda Mayorga,  Daniel, MD;  Location: [facility];  Service: Gastroenterology;  Laterality: N/A;   ESOPHAGOGASTRODUODENOSCOPY (EGD) WITH PROPOFOL N/A 03/09/2021   Procedure: ESOPHAGOGASTRODUODENOSCOPY (EGD) WITH PROPOFOL;  Surgeon: Dolores Frameastaneda Mayorga, Daniel, MD;  Location: [facility];  Service: Gastroenterology;  Laterality: N/A;   EYE SURGERY     Bilateral cataracts   INTRAMEDULLARY (IM) NAIL INTERTROCHANTERIC Left 07/04/2019   Procedure: INTRAMEDULLARY (IM) NAIL INTERTROCHANTRIC;  Surgeon: Samson FredericSwinteck, Tayanna Talford, MD;  Location: [facility];  Service: Orthopedics;  Laterality: Left;   TONSILLECTOMY     UPPER GASTROINTESTINAL ENDOSCOPY     Done by Dr. Gabriel Cirriemason   FAMILY HISTORY Family History  Problem Relation Age of Onset   Cancer Mother    SOCIAL HISTORY Social History   Tobacco Use   Smoking status: Former    Passive exposure: Past   Smokeless tobacco: Never  Vaping Use   Vaping Use: Never used  Substance Use Topics   Alcohol use: Yes    Alcohol/week: 1.0 standard drink of alcohol    Types: 1 Glasses of wine per week   Drug use: Not Currently       OPHTHALMIC EXAM:  Base Eye Exam     Visual Acuity (Snellen - Linear)       Right Left   Dist cc 20/70 20/30   Dist ph cc 20/60 NI    Correction: Glasses         Tonometry (Tonopen, 1:44 PM)       Right Left   Pressure 15 15         Pupils       Dark Light Shape React APD   Right 2 2 Round Minimal None   Left 2 2 Round Minimal None         Visual Fields       Left Right    Full Full         Extraocular Movement       Right Left    Full, Ortho Full, Ortho         Neuro/Psych     Oriented x3: Yes   Mood/Affect: Normal         Dilation     Both eyes: 1.0% Mydriacyl, 2.5% Phenylephrine @ 1:40 PM           Slit Lamp and Fundus Exam     External Exam       Right Left   External Normal Normal         Slit Lamp Exam       Right Left   Lids/Lashes Dermatochalasis - upper lid, Meibomian gland  dysfunction Dermatochalasis - upper lid, Meibomian gland dysfunction   Conjunctiva/Sclera White and quiet White and quiet   Cornea arcus, 2+ Punctate epithelial erosions, Descemet's folds, tear film debris arcus, 1+ Punctate epithelial erosions, trace Descemet's folds, tear film debris  Anterior Chamber deep and clear deep and clear   Iris Round and dilated Round and dilated   Lens 3 piece PC IOL in good position with open PC 3 piece PC IOL in good position with open PC   Anterior Vitreous Vitreous syneresis Posterior vitreous detachment         Fundus Exam       Right Left   Disc Pink and Sharp, +hyperemia, vascular loops mild Pallor, Sharp rim, central hyperemia, mild PPA   C/D Ratio 0.3 0.2   Macula hazy view, Blunted foveal reflex, +edema and scattered IRH, +drusen Flat, Good foveal reflex, RPE mottling and clumping, Drusen, No heme or edema   Vessels attenuated, Tortuous, old CRVO attenuated, Tortuous   Periphery Attached, mild reticular degeneration Attached, mild reticular degen           Refraction     Wearing Rx       Sphere Cylinder Axis Add   Right -0.75 +2.00 178 +2.50   Left -0.50 +2.00 170 +2.50           IMAGING AND PROCEDURES  Imaging and Procedures for 01/21/2023  OCT, Retina - OU - Both Eyes       Right Eye Quality was borderline. Scan locations included subfoveal. Central Foveal Thickness: 223. Progression has been stable. Findings include abnormal foveal contour, intraretinal fluid, subretinal fluid, outer retinal atrophy (Persistent IRF/cystic changes temporal fovea and macula, SRF and foveal contour stably improved).   Left Eye Quality was good. Scan locations included subfoveal. Central Foveal Thickness: 255. Progression has been stable. Findings include normal foveal contour, no IRF, no SRF, retinal drusen .   Notes *Images captured and stored on drive  Diagnosis / Impression:  OD: Persistent IRF/cystic changes temporal fovea and macula,  SRF and foveal contour stably improved OS: NFP, no IRF/SRF  Clinical management:  See below  Abbreviations: NFP - Normal foveal profile. CME - cystoid macular edema. PED - pigment epithelial detachment. IRF - intraretinal fluid. SRF - subretinal fluid. EZ - ellipsoid zone. ERM - epiretinal membrane. ORA - outer retinal atrophy. ORT - outer retinal tubulation. SRHM - subretinal hyper-reflective material. IRHM - intraretinal hyper-reflective material      Intravitreal Injection, Pharmacologic Agent - OD - Right Eye       Time Out 01/21/2023. 2:10 PM. Confirmed correct patient, procedure, site, and patient consented.   Anesthesia Topical anesthesia was used. Anesthetic medications included Lidocaine 2%, Proparacaine 0.5%.   Procedure Preparation included 5% betadine to ocular surface, eyelid speculum. A supplied (32g) needle was used.   Injection: 1.25 mg Bevacizumab 1.25mg /0.6ml   Route: Intravitreal, Site: Right Eye   NDC: [id number], Lot: [id number] , Expiration date: 02/28/2023   Post-op Post injection exam found visual acuity of at least counting fingers. The patient tolerated the procedure well. There were no complications. The patient received written and verbal post procedure care education.            ASSESSMENT/PLAN:    ICD-10-CM   1. Central retinal vein occlusion with macular edema of right eye  H34.8110 OCT, Retina - OU - Both Eyes    Intravitreal Injection, Pharmacologic Agent - OD - Right Eye    Bevacizumab (AVASTIN) SOLN 1.25 mg    2. Intermediate stage nonexudative age-related macular degeneration of both eyes  H35.3132     3. Essential hypertension  I10     4. Hypertensive retinopathy of both eyes  H35.033     5. Pseudophakia of both eyes  Z96.1     6. Dry eyes  H04.123      CRVO w/ CME, OD  - previous Dr. Luciana Axe pt here for insurance reasons  - history of IVA OD x13 (last one 09.14.23 -- 14 wks) - s/p IVA OD #14 (12.19.23), #15 (01.30.24), #16  (03.12.24)  - OCT shows OD: Persistent IRF/cystic changes temporal fovea and macula, SRF and foveal contour stably improved at 6 weeks  - recommend IVA OD #17 today 04.23.24, with follow up in 6 weeks  - RBA of procedure discussed, questions answered - IVA informed consent obtained and signed 12.19.23  - see procedure note - will check [employer] auth for next visit - f/u 6 wks -- DFE/OCT, possible injection  2. Age related macular degeneration, non-exudative, OU  - mild-intermediate stage  - The incidence, anatomy, and pathology of dry AMD, risk of progression, and the AREDS and AREDS 2 studies including smoking risks discussed with patient.  - Recommend amsler grid monitoring  3,4. Hypertensive retinopathy OU - discussed importance of tight BP control - continue to monitor  5. Pseudophakia OU  - s/p CE/IOL OU  - IOLs in good position, doing well  - continue to monitor  6. Dry eyes OU - recommend artificial tears and lubricating ointment as needed  Ophthalmic Meds Ordered this visit:  Meds ordered this encounter  Medications   Bevacizumab (AVASTIN) SOLN 1.25 mg     Return in about 6 weeks (around 03/04/2023) for f/u CRVO OD, DFE, OCT.  There are no Patient Instructions on file for this visit.   Explained the diagnoses, plan, and follow up with the patient and they expressed understanding.  Patient expressed understanding of the importance of proper follow up care.   This document serves as a record of services personally performed by Karie Chimera, MD, PhD. It was created on their behalf by Gerilyn Nestle, COT an ophthalmic technician. The creation of this record is the provider's dictation and/or activities during the visit.    Electronically signed by:  Gerilyn Nestle, COT  04.09.24 4:52 PM  This document serves as a record of services personally performed by Karie Chimera, MD, PhD. It was created on their behalf by Glee Arvin. Manson Passey, OA an ophthalmic technician. The  creation of this record is the provider's dictation and/or activities during the visit.    Electronically signed by: Glee Arvin. Manson Passey, [place] 04.23.2024 4:52 PM   Karie Chimera, M.D., Ph.D. Diseases & Surgery of the Retina and Vitreous [facility]  I have reviewed the above documentation for accuracy and completeness, and I agree with the above. Karie Chimera, M.D., Ph.D. 01/21/23 4:53 PM  Abbreviations: M myopia (nearsighted); A astigmatism; H hyperopia (farsighted); P presbyopia; Mrx spectacle prescription;  CTL contact lenses; OD right eye; OS left eye; OU both eyes  XT exotropia; ET esotropia; PEK punctate epithelial keratitis; PEE punctate epithelial erosions; DES dry eye syndrome; MGD meibomian gland dysfunction; ATs artificial tears; PFAT's preservative free artificial tears; NSC nuclear sclerotic cataract; PSC posterior subcapsular cataract; ERM epi-retinal membrane; PVD posterior vitreous detachment; RD retinal detachment; DM diabetes mellitus; DR diabetic retinopathy; NPDR non-proliferative diabetic retinopathy; PDR proliferative diabetic retinopathy; CSME clinically significant macular edema; DME diabetic macular edema; dbh dot blot hemorrhages; CWS cotton wool spot; POAG primary open angle glaucoma; C/D cup-to-disc ratio; HVF humphrey visual field; GVF goldmann visual field; OCT optical coherence tomography; IOP intraocular pressure; BRVO Branch retinal vein occlusion; CRVO central retinal vein occlusion;  CRAO central retinal artery occlusion; BRAO branch retinal artery occlusion; RT retinal tear; SB scleral buckle; PPV pars plana vitrectomy; VH Vitreous hemorrhage; PRP panretinal laser photocoagulation; IVK intravitreal kenalog; VMT vitreomacular traction; MH Macular hole;  NVD neovascularization of the disc; NVE neovascularization elsewhere; AREDS age related eye disease study; ARMD age related macular degeneration; POAG primary open angle glaucoma; EBMD  epithelial/anterior basement membrane dystrophy; ACIOL anterior chamber intraocular lens; IOL intraocular lens; PCIOL posterior chamber intraocular lens; Phaco/IOL phacoemulsification with intraocular lens placement; PRK photorefractive keratectomy; LASIK laser assisted in situ keratomileusis; HTN hypertension; DM diabetes mellitus; COPD chronic obstructive pulmonary disease

## 2023-01-21 ENCOUNTER — Ambulatory Visit (INDEPENDENT_AMBULATORY_CARE_PROVIDER_SITE_OTHER): Payer: Medicare Other | Admitting: Ophthalmology

## 2023-01-21 ENCOUNTER — Encounter (INDEPENDENT_AMBULATORY_CARE_PROVIDER_SITE_OTHER): Payer: Self-pay | Admitting: Ophthalmology

## 2023-01-21 DIAGNOSIS — H04123 Dry eye syndrome of bilateral lacrimal glands: Secondary | ICD-10-CM

## 2023-01-21 DIAGNOSIS — H34811 Central retinal vein occlusion, right eye, with macular edema: Secondary | ICD-10-CM | POA: Diagnosis not present

## 2023-01-21 DIAGNOSIS — H35033 Hypertensive retinopathy, bilateral: Secondary | ICD-10-CM | POA: Diagnosis not present

## 2023-01-21 DIAGNOSIS — H353132 Nonexudative age-related macular degeneration, bilateral, intermediate dry stage: Secondary | ICD-10-CM | POA: Diagnosis not present

## 2023-01-21 DIAGNOSIS — Z961 Presence of intraocular lens: Secondary | ICD-10-CM

## 2023-01-21 DIAGNOSIS — I10 Essential (primary) hypertension: Secondary | ICD-10-CM | POA: Diagnosis not present

## 2023-01-21 MED ORDER — BEVACIZUMAB CHEMO INJECTION 1.25MG/0.05ML SYRINGE FOR KALEIDOSCOPE
1.2500 mg | INTRAVITREAL | Status: AC | PRN
  Administered 2023-01-21: 1.25 mg via INTRAVITREAL

## 2023-01-30 DIAGNOSIS — K080 Exfoliation of teeth due to systemic causes: Secondary | ICD-10-CM | POA: Diagnosis not present

## 2023-02-18 NOTE — Progress Notes (Signed)
[facility] - Clinic Note  03/04/2023     CHIEF COMPLAINT Patient presents for Retina Follow Up   HISTORY OF PRESENT ILLNESS: Brenda Reynolds is a 89 y.o. female who presents to the clinic today for:   HPI     Retina Follow Up   Patient presents with  CRVO/BRVO.  In right eye.  This started 6 weeks ago.  I, the attending physician,  performed the HPI with the patient and updated documentation appropriately.        Comments   Patient here for 6 weeks for retina follow up for CRVO OD. Patient states vision past day or 2 OD seems like when reads sees something else instead of correct letter. Uses magnifying glass. Has felt bad physically past few days. Messes with eye. No eye pain. Uses drops 1 - 2 times a day.      Last edited by Rennis Chris, MD on 03/04/2023  4:29 PM.     Referring physician: Toma Deiters, MD [address] [place],  [place] [address]  HISTORICAL INFORMATION:   Selected notes from the MEDICAL RECORD NUMBER Referred by Dr. Luciana Axe due to insurance network LEE:  Ocular Hx- PMH-    CURRENT MEDICATIONS: No current outpatient medications on file. (Ophthalmic Drugs)   No current facility-administered medications for this visit. (Ophthalmic Drugs)   Current Outpatient Medications (Other)  Medication Sig   acetaminophen (TYLENOL) 500 MG tablet Take 500-1,000 mg by mouth every 6 (six) hours as needed for moderate pain.   alendronate (FOSAMAX) 70 MG tablet Take 70 mg by mouth every Tuesday.   calcium-vitamin D (OSCAL WITH D) 500-200 MG-UNIT tablet Take 1 tablet by mouth 3 (three) times daily.   colestipol (COLESTID) 1 g tablet TAKE ONE TABLET BY MOUTH EVERY MORNING, AT NOON AND AT BEDTIME   famotidine (PEPCID) 20 MG tablet Take 20 mg by mouth daily.   folic acid (FOLVITE) 400 MCG tablet Take 400 mcg by mouth daily.   gabapentin (NEURONTIN) 300 MG capsule Take 300 mg by mouth 3 (three) times daily.   glucosamine-chondroitin 500-400 MG  tablet Take 1 tablet by mouth daily.   HYDROcodone-acetaminophen (NORCO/VICODIN) 5-325 MG tablet Take 1 tablet by mouth 2 (two) times daily as needed for moderate pain.   losartan (COZAAR) 100 MG tablet Take 100 mg by mouth daily.   Multiple Vitamin (MULTIVITAMIN) tablet Take 1 tablet by mouth daily.   Omega-3 Fatty Acids (FISH OIL) 1000 MG CAPS Take 2,000 mg by mouth daily.   omeprazole (PRILOSEC) 40 MG capsule Take 1 capsule (40 mg total) by mouth daily.   vitamin B-12 (CYANOCOBALAMIN) 500 MCG tablet Take 500 mcg by mouth daily.   vitamin C (ASCORBIC ACID) 500 MG tablet Take 1 tablet (500 mg total) by mouth daily.   No current facility-administered medications for this visit. (Other)   REVIEW OF SYSTEMS: ROS   Positive for: Gastrointestinal, Cardiovascular, Eyes Negative for: Constitutional, Neurological, Skin, Genitourinary, Musculoskeletal, HENT, Endocrine, Respiratory, Psychiatric, Allergic/Imm, Heme/Lymph Last edited by Laddie Aquas, COA on 03/04/2023  2:07 PM.     ALLERGIES Allergies  Allergen Reactions   Lidocaine Rash    Patch   PAST MEDICAL HISTORY Past Medical History:  Diagnosis Date   GERD (gastroesophageal reflux disease)    Hypertension    Intertrochanteric fracture of left femur (HCC)    Osteoporosis    Scoliosis    Past Surgical History:  Procedure Laterality Date   BIOPSY  03/09/2021   Procedure: BIOPSY;  Surgeon: Marguerita Merles, Reuel Boom, MD;  Location: [facility];  Service: Gastroenterology;;   CATARACT EXTRACTION     COLONOSCOPY     Per patient this was done by Dr. Teena Dunk   COLONOSCOPY WITH PROPOFOL N/A 03/09/2021   Procedure: COLONOSCOPY WITH PROPOFOL;  Surgeon: Dolores Frame, MD;  Location: [facility];  Service: Gastroenterology;  Laterality: N/A;  10:50   DILATION AND CURETTAGE OF UTERUS     ESOPHAGEAL DILATION N/A 03/09/2021   Procedure: ESOPHAGEAL DILATION;  Surgeon: Dolores Frame, MD;  Location: [facility];   Service: Gastroenterology;  Laterality: N/A;   ESOPHAGOGASTRODUODENOSCOPY (EGD) WITH PROPOFOL N/A 03/09/2021   Procedure: ESOPHAGOGASTRODUODENOSCOPY (EGD) WITH PROPOFOL;  Surgeon: Dolores Frame, MD;  Location: [facility];  Service: Gastroenterology;  Laterality: N/A;   EYE SURGERY     Bilateral cataracts   INTRAMEDULLARY (IM) NAIL INTERTROCHANTERIC Left 07/04/2019   Procedure: INTRAMEDULLARY (IM) NAIL INTERTROCHANTRIC;  Surgeon: Samson Frederic, MD;  Location: [facility];  Service: Orthopedics;  Laterality: Left;   TONSILLECTOMY     UPPER GASTROINTESTINAL ENDOSCOPY     Done by Dr. Gabriel Cirri   FAMILY HISTORY Family History  Problem Relation Age of Onset   Cancer Mother    SOCIAL HISTORY Social History   Tobacco Use   Smoking status: Former    Passive exposure: Past   Smokeless tobacco: Never  Vaping Use   Vaping Use: Never used  Substance Use Topics   Alcohol use: Yes    Alcohol/week: 1.0 standard drink of alcohol    Types: 1 Glasses of wine per week   Drug use: Not Currently       OPHTHALMIC EXAM:  Base Eye Exam     Visual Acuity (Snellen - Linear)       Right Left   Dist cc 20/70 -2 20/40   Dist ph cc 20/60 -1 20/30 -1    Correction: Glasses         Tonometry (Tonopen, 2:03 PM)       Right Left   Pressure 13 14         Pupils       Dark Light Shape React APD   Right 2 2 Round Minimal None   Left 2 2 Round Minimal None         Visual Fields (Counting fingers)       Left Right    Full Full         Extraocular Movement       Right Left    Full, Ortho Full, Ortho         Neuro/Psych     Oriented x3: Yes   Mood/Affect: Normal         Dilation     Both eyes: 1.0% Mydriacyl, 2.5% Phenylephrine @ 2:03 PM           Slit Lamp and Fundus Exam     External Exam       Right Left   External Normal Normal         Slit Lamp Exam       Right Left   Lids/Lashes Dermatochalasis - upper lid, Meibomian gland dysfunction  Dermatochalasis - upper lid, Meibomian gland dysfunction   Conjunctiva/Sclera White and quiet White and quiet   Cornea arcus, 2+ Punctate epithelial erosions, Descemet's folds, tear film debris arcus, 1+ Punctate epithelial erosions, trace Descemet's folds, tear film debris   Anterior Chamber deep and clear  deep and clear   Iris Round and dilated Round and dilated   Lens 3 piece PC IOL in good position with open PC 3 piece PC IOL in good position with open PC   Anterior Vitreous Vitreous syneresis Posterior vitreous detachment         Fundus Exam       Right Left   Disc Pink and Sharp, +hyperemia, vascular loops mild Pallor, Sharp rim, central hyperemia, mild PPA   C/D Ratio 0.3 0.2   Macula hazy view, Blunted foveal reflex, +edema and scattered IRH, +drusen Flat, Good foveal reflex, RPE mottling and clumping, Drusen, No heme or edema   Vessels attenuated, Tortuous, old CRVO attenuated, Tortuous   Periphery Attached, mild reticular degeneration Attached, mild reticular degen           Refraction     Wearing Rx       Sphere Cylinder Axis Add   Right -0.75 +2.00 178 +2.50   Left -0.50 +2.00 170 +2.50           IMAGING AND PROCEDURES  Imaging and Procedures for 03/04/2023  OCT, Retina - OU - Both Eyes       Right Eye Quality was borderline. Scan locations included subfoveal. Central Foveal Thickness: 235. Progression has been stable. Findings include abnormal foveal contour, intraretinal fluid, subretinal fluid, outer retinal atrophy (Persistent cystic changes temporal fovea and macula -- slightly improved, SRF and foveal contour stably improved).   Left Eye Quality was good. Scan locations included subfoveal. Central Foveal Thickness: 255. Progression has been stable. Findings include normal foveal contour, no IRF, no SRF, retinal drusen .   Notes *Images captured and stored on drive  Diagnosis / Impression:  OD: Persistent cystic changes temporal fovea and macula --  slightly improved, SRF and foveal contour stably improved OS: NFP, no IRF/SRF  Clinical management:  See below  Abbreviations: NFP - Normal foveal profile. CME - cystoid macular edema. PED - pigment epithelial detachment. IRF - intraretinal fluid. SRF - subretinal fluid. EZ - ellipsoid zone. ERM - epiretinal membrane. ORA - outer retinal atrophy. ORT - outer retinal tubulation. SRHM - subretinal hyper-reflective material. IRHM - intraretinal hyper-reflective material      Intravitreal Injection, Pharmacologic Agent - OD - Right Eye       Time Out 03/04/2023. 3:29 PM. Confirmed correct patient, procedure, site, and patient consented.   Anesthesia Topical anesthesia was used. Anesthetic medications included Lidocaine 2%, Proparacaine 0.5%.   Procedure Preparation included 5% betadine to ocular surface, eyelid speculum. A (32g) needle was used.   Injection: 2 mg aflibercept 2 MG/0.05ML   Route: Intravitreal, Site: Right Eye   NDC: [id number], Lot: [id number], Expiration date: 01/28/2024, Waste: 0 mL   Post-op Post injection exam found visual acuity of at least counting fingers. The patient tolerated the procedure well. There were no complications. The patient received written and verbal post procedure care education.            ASSESSMENT/PLAN:    ICD-10-CM   1. Central retinal vein occlusion with macular edema of right eye  H34.8110 OCT, Retina - OU - Both Eyes    Intravitreal Injection, Pharmacologic Agent - OD - Right Eye    aflibercept (EYLEA) SOLN 2 mg    2. Intermediate stage nonexudative age-related macular degeneration of both eyes  H35.3132     3. Essential hypertension  I10     4. Hypertensive retinopathy of both eyes  H35.033     5.  Pseudophakia of both eyes  Z96.1     6. Dry eyes  H04.123      CRVO w/ CME, OD  - previous Dr. Luciana Axe pt here for insurance reasons  - history of IVA OD x13 (last one 09.14.23 -- 14 wks) - s/p IVA OD #14 (12.19.23), #15  (01.30.24), #16 (03.12.24), #17 (04.23.24)  - OCT shows OD: Persistent cystic changes, SRF and foveal contour stably improved at 6 weeks **discussed decreased efficacy / resistance to Avastin and potential benefit of switching medication**   - recommend switching to IVE OD #1 today 06.04.24, with follow up in 6 weeks  - RBA of procedure discussed, questions answered - IVE informed consent obtained and signed 06.04.24  - see procedure note - Eylea approved for 2024 - f/u 6 wks -- DFE/OCT, possible injection  2. Age related macular degeneration, non-exudative, OU  - mild-intermediate stage  - The incidence, anatomy, and pathology of dry AMD, risk of progression, and the AREDS and AREDS 2 studies including smoking risks discussed with patient.  - Recommend amsler grid monitoring  3,4. Hypertensive retinopathy OU - discussed importance of tight BP control - continue to monitor  5. Pseudophakia OU  - s/p CE/IOL OU  - IOLs in good position, doing well  - continue to monitor  6. Dry eyes OU - recommend artificial tears and lubricating ointment as needed  Ophthalmic Meds Ordered this visit:  Meds ordered this encounter  Medications   aflibercept (EYLEA) SOLN 2 mg     Return in about 6 weeks (around 04/15/2023) for f/u CRVO OD, DFE, OCT.  There are no Patient Instructions on file for this visit.   Explained the diagnoses, plan, and follow up with the patient and they expressed understanding.  Patient expressed understanding of the importance of proper follow up care.   This document serves as a record of services personally performed by Karie Chimera, MD, PhD. It was created on their behalf by Gerilyn Nestle, COT an ophthalmic technician. The creation of this record is the provider's dictation and/or activities during the visit.    Electronically signed by:  Gerilyn Nestle, COT  05.21.24 9:29 PM  This document serves as a record of services personally performed by Karie Chimera, MD, PhD. It was created on their behalf by Glee Arvin. Manson Passey, OA an ophthalmic technician. The creation of this record is the provider's dictation and/or activities during the visit.    Electronically signed by: Glee Arvin. Manson Passey, [place] 06.04.2024 9:29 PM  Karie Chimera, M.D., Ph.D. Diseases & Surgery of the Retina and Vitreous [facility]  I have reviewed the above documentation for accuracy and completeness, and I agree with the above. Karie Chimera, M.D., Ph.D. 03/04/23 9:36 PM  Abbreviations: M myopia (nearsighted); A astigmatism; H hyperopia (farsighted); P presbyopia; Mrx spectacle prescription;  CTL contact lenses; OD right eye; OS left eye; OU both eyes  XT exotropia; ET esotropia; PEK punctate epithelial keratitis; PEE punctate epithelial erosions; DES dry eye syndrome; MGD meibomian gland dysfunction; ATs artificial tears; PFAT's preservative free artificial tears; NSC nuclear sclerotic cataract; PSC posterior subcapsular cataract; ERM epi-retinal membrane; PVD posterior vitreous detachment; RD retinal detachment; DM diabetes mellitus; DR diabetic retinopathy; NPDR non-proliferative diabetic retinopathy; PDR proliferative diabetic retinopathy; CSME clinically significant macular edema; DME diabetic macular edema; dbh dot blot hemorrhages; CWS cotton wool spot; POAG primary open angle glaucoma; C/D cup-to-disc ratio; HVF humphrey visual field; GVF goldmann visual field; OCT optical  coherence tomography; IOP intraocular pressure; BRVO Branch retinal vein occlusion; CRVO central retinal vein occlusion; CRAO central retinal artery occlusion; BRAO branch retinal artery occlusion; RT retinal tear; SB scleral buckle; PPV pars plana vitrectomy; VH Vitreous hemorrhage; PRP panretinal laser photocoagulation; IVK intravitreal kenalog; VMT vitreomacular traction; MH Macular hole;  NVD neovascularization of the disc; NVE neovascularization elsewhere; AREDS age related eye disease  study; ARMD age related macular degeneration; POAG primary open angle glaucoma; EBMD epithelial/anterior basement membrane dystrophy; ACIOL anterior chamber intraocular lens; IOL intraocular lens; PCIOL posterior chamber intraocular lens; Phaco/IOL phacoemulsification with intraocular lens placement; PRK photorefractive keratectomy; LASIK laser assisted in situ keratomileusis; HTN hypertension; DM diabetes mellitus; COPD chronic obstructive pulmonary disease

## 2023-02-25 DIAGNOSIS — K2100 Gastro-esophageal reflux disease with esophagitis, without bleeding: Secondary | ICD-10-CM | POA: Diagnosis not present

## 2023-02-25 DIAGNOSIS — M25619 Stiffness of unspecified shoulder, not elsewhere classified: Secondary | ICD-10-CM | POA: Diagnosis not present

## 2023-02-25 DIAGNOSIS — Z0000 Encounter for general adult medical examination without abnormal findings: Secondary | ICD-10-CM | POA: Diagnosis not present

## 2023-02-25 DIAGNOSIS — R32 Unspecified urinary incontinence: Secondary | ICD-10-CM | POA: Diagnosis not present

## 2023-02-25 DIAGNOSIS — I10 Essential (primary) hypertension: Secondary | ICD-10-CM | POA: Diagnosis not present

## 2023-03-04 ENCOUNTER — Encounter (INDEPENDENT_AMBULATORY_CARE_PROVIDER_SITE_OTHER): Payer: Self-pay | Admitting: Ophthalmology

## 2023-03-04 ENCOUNTER — Ambulatory Visit (INDEPENDENT_AMBULATORY_CARE_PROVIDER_SITE_OTHER): Payer: Medicare Other | Admitting: Ophthalmology

## 2023-03-04 DIAGNOSIS — H35033 Hypertensive retinopathy, bilateral: Secondary | ICD-10-CM

## 2023-03-04 DIAGNOSIS — H353132 Nonexudative age-related macular degeneration, bilateral, intermediate dry stage: Secondary | ICD-10-CM | POA: Diagnosis not present

## 2023-03-04 DIAGNOSIS — H34811 Central retinal vein occlusion, right eye, with macular edema: Secondary | ICD-10-CM | POA: Diagnosis not present

## 2023-03-04 DIAGNOSIS — H04123 Dry eye syndrome of bilateral lacrimal glands: Secondary | ICD-10-CM

## 2023-03-04 DIAGNOSIS — I10 Essential (primary) hypertension: Secondary | ICD-10-CM | POA: Diagnosis not present

## 2023-03-04 DIAGNOSIS — Z961 Presence of intraocular lens: Secondary | ICD-10-CM

## 2023-03-04 MED ORDER — AFLIBERCEPT 2MG/0.05ML IZ SOLN FOR KALEIDOSCOPE
2.0000 mg | INTRAVITREAL | Status: AC | PRN
  Administered 2023-03-04: 2 mg via INTRAVITREAL

## 2023-03-19 ENCOUNTER — Encounter (INDEPENDENT_AMBULATORY_CARE_PROVIDER_SITE_OTHER): Payer: Self-pay | Admitting: Gastroenterology

## 2023-04-08 NOTE — Progress Notes (Signed)
[facility] - Clinic Note  04/15/2023     CHIEF COMPLAINT Patient presents for Retina Follow Up   HISTORY OF PRESENT ILLNESS: Brenda Reynolds is a 89 y.o. female who presents to the clinic today for:   HPI     Retina Follow Up   Patient presents with  CRVO/BRVO.  In right eye.  This started months ago.  Duration of 6 weeks.  I, the attending physician,  performed the HPI with the patient and updated documentation appropriately.        Comments   Patient feels that the vision is the same. She is not using eye drops.      Last edited by Rennis Chris, MD on 04/15/2023 11:04 PM.    Pt states vision is stable  Referring physician: Toma Deiters, MD [address] [place],  [place] [address]  HISTORICAL INFORMATION:   Selected notes from the MEDICAL RECORD NUMBER Referred by Dr. Luciana Axe due to insurance network LEE:  Ocular Hx- PMH-    CURRENT MEDICATIONS: No current outpatient medications on file. (Ophthalmic Drugs)   No current facility-administered medications for this visit. (Ophthalmic Drugs)   Current Outpatient Medications (Other)  Medication Sig   acetaminophen (TYLENOL) 500 MG tablet Take 500-1,000 mg by mouth every 6 (six) hours as needed for moderate pain.   alendronate (FOSAMAX) 70 MG tablet Take 70 mg by mouth every Tuesday.   calcium-vitamin D (OSCAL WITH D) 500-200 MG-UNIT tablet Take 1 tablet by mouth 3 (three) times daily.   colestipol (COLESTID) 1 g tablet TAKE ONE TABLET BY MOUTH EVERY MORNING, AT NOON AND AT BEDTIME   famotidine (PEPCID) 20 MG tablet Take 20 mg by mouth daily.   folic acid (FOLVITE) 400 MCG tablet Take 400 mcg by mouth daily.   gabapentin (NEURONTIN) 300 MG capsule Take 300 mg by mouth 3 (three) times daily.   glucosamine-chondroitin 500-400 MG tablet Take 1 tablet by mouth daily.   HYDROcodone-acetaminophen (NORCO/VICODIN) 5-325 MG tablet Take 1 tablet by mouth 2 (two) times daily as needed for moderate pain.    losartan (COZAAR) 100 MG tablet Take 100 mg by mouth daily.   Multiple Vitamin (MULTIVITAMIN) tablet Take 1 tablet by mouth daily.   Omega-3 Fatty Acids (FISH OIL) 1000 MG CAPS Take 2,000 mg by mouth daily.   omeprazole (PRILOSEC) 40 MG capsule Take 1 capsule (40 mg total) by mouth daily.   vitamin B-12 (CYANOCOBALAMIN) 500 MCG tablet Take 500 mcg by mouth daily.   vitamin C (ASCORBIC ACID) 500 MG tablet Take 1 tablet (500 mg total) by mouth daily.   No current facility-administered medications for this visit. (Other)   REVIEW OF SYSTEMS: ROS   Positive for: Gastrointestinal, Cardiovascular, Eyes Negative for: Constitutional, Neurological, Skin, Genitourinary, Musculoskeletal, HENT, Endocrine, Respiratory, Psychiatric, Allergic/Imm, Heme/Lymph Last edited by Charlette Caffey, COT on 04/15/2023  1:50 PM.      ALLERGIES Allergies  Allergen Reactions   Lidocaine Rash    Patch   PAST MEDICAL HISTORY Past Medical History:  Diagnosis Date   GERD (gastroesophageal reflux disease)    Hypertension    Intertrochanteric fracture of left femur (HCC)    Osteoporosis    Scoliosis    Past Surgical History:  Procedure Laterality Date   BIOPSY  03/09/2021   Procedure: BIOPSY;  Surgeon: Dolores Frame, MD;  Location: [facility];  Service: Gastroenterology;;   CATARACT EXTRACTION     COLONOSCOPY  Per patient this was done by Dr. Teena Dunk   COLONOSCOPY WITH PROPOFOL N/A 03/09/2021   Procedure: COLONOSCOPY WITH PROPOFOL;  Surgeon: Dolores Frame, MD;  Location: [facility];  Service: Gastroenterology;  Laterality: N/A;  10:50   DILATION AND CURETTAGE OF UTERUS     ESOPHAGEAL DILATION N/A 03/09/2021   Procedure: ESOPHAGEAL DILATION;  Surgeon: Dolores Frame, MD;  Location: [facility];  Service: Gastroenterology;  Laterality: N/A;   ESOPHAGOGASTRODUODENOSCOPY (EGD) WITH PROPOFOL N/A 03/09/2021   Procedure: ESOPHAGOGASTRODUODENOSCOPY (EGD) WITH  PROPOFOL;  Surgeon: Dolores Frame, MD;  Location: [facility];  Service: Gastroenterology;  Laterality: N/A;   EYE SURGERY     Bilateral cataracts   INTRAMEDULLARY (IM) NAIL INTERTROCHANTERIC Left 07/04/2019   Procedure: INTRAMEDULLARY (IM) NAIL INTERTROCHANTRIC;  Surgeon: Samson Frederic, MD;  Location: [facility];  Service: Orthopedics;  Laterality: Left;   TONSILLECTOMY     UPPER GASTROINTESTINAL ENDOSCOPY     Done by Dr. Gabriel Cirri   FAMILY HISTORY Family History  Problem Relation Age of Onset   Cancer Mother    SOCIAL HISTORY Social History   Tobacco Use   Smoking status: Former    Passive exposure: Past   Smokeless tobacco: Never  Vaping Use   Vaping status: Never Used  Substance Use Topics   Alcohol use: Yes    Alcohol/week: 1.0 standard drink of alcohol    Types: 1 Glasses of wine per week   Drug use: Not Currently       OPHTHALMIC EXAM:  Base Eye Exam     Visual Acuity (Snellen - Linear)       Right Left   Dist cc 20/70 20/40   Dist ph cc 20/60 20/30    Correction: Glasses         Tonometry (Tonopen, 1:54 PM)       Right Left   Pressure 12 15         Pupils       Dark Light Shape React APD   Right 2 2 Round Minimal None   Left 2 2 Round Minimal None         Visual Fields       Left Right    Full Full         Extraocular Movement       Right Left    Full, Ortho Full, Ortho         Neuro/Psych     Oriented x3: Yes   Mood/Affect: Normal         Dilation     Both eyes: 1.0% Mydriacyl, 2.5% Phenylephrine @ 1:51 PM           Slit Lamp and Fundus Exam     External Exam       Right Left   External Normal Normal         Slit Lamp Exam       Right Left   Lids/Lashes Dermatochalasis - upper lid, Meibomian gland dysfunction Dermatochalasis - upper lid, Meibomian gland dysfunction   Conjunctiva/Sclera White and quiet White and quiet   Cornea arcus, 2+ Punctate epithelial erosions, Descemet's folds, tear  film debris arcus, 1+ Punctate epithelial erosions, trace Descemet's folds, tear film debris   Anterior Chamber deep and clear deep and clear   Iris Round and dilated Round and dilated   Lens 3 piece PC IOL in good position with open PC 3 piece PC IOL in good position with open PC  Anterior Vitreous Vitreous syneresis, vitreous condensations Posterior vitreous detachment, vitreous condensations         Fundus Exam       Right Left   Disc Pink and Sharp, +hyperemia, vascular loops mild Pallor, Sharp rim, central hyperemia, mild PPA   C/D Ratio 0.3 0.2   Macula hazy view, Blunted foveal reflex, +edema and scattered IRH -- improved, +drusen Flat, Good foveal reflex, RPE mottling and clumping, Drusen, No heme or edema   Vessels attenuated, Tortuous, old CRVO attenuated, Tortuous   Periphery Attached, mild reticular degeneration Attached, mild reticular degeneration, No heme           Refraction     Wearing Rx       Sphere Cylinder Axis Add   Right -0.75 +2.00 178 +2.50   Left -0.50 +2.00 170 +2.50           IMAGING AND PROCEDURES  Imaging and Procedures for 04/15/2023  OCT, Retina - OU - Both Eyes       Right Eye Quality was borderline. Scan locations included subfoveal. Central Foveal Thickness: 209. Progression has improved. Findings include abnormal foveal contour, intraretinal fluid, subretinal fluid, outer retinal atrophy (Persistent cystic changes temporal fovea and macula -- slightly improved, SRF and foveal contour stably improved).   Left Eye Quality was good. Scan locations included subfoveal. Central Foveal Thickness: 256. Progression has been stable. Findings include normal foveal contour, no IRF, no SRF, retinal drusen .   Notes *Images captured and stored on drive  Diagnosis / Impression:  OD: Persistent cystic changes temporal fovea and macula -- slightly improved, SRF and foveal contour stably improved OS: NFP, no IRF/SRF  Clinical management:  See  below  Abbreviations: NFP - Normal foveal profile. CME - cystoid macular edema. PED - pigment epithelial detachment. IRF - intraretinal fluid. SRF - subretinal fluid. EZ - ellipsoid zone. ERM - epiretinal membrane. ORA - outer retinal atrophy. ORT - outer retinal tubulation. SRHM - subretinal hyper-reflective material. IRHM - intraretinal hyper-reflective material      Intravitreal Injection, Pharmacologic Agent - OD - Right Eye       Time Out 04/15/2023. 2:25 PM. Confirmed correct patient, procedure, site, and patient consented.   Anesthesia Topical anesthesia was used. Anesthetic medications included Lidocaine 2%, Proparacaine 0.5%.   Procedure Preparation included 5% betadine to ocular surface, eyelid speculum. A (32g) needle was used.   Injection: 2 mg aflibercept 2 MG/0.05ML   Route: Intravitreal, Site: Right Eye   NDC: [id number], Lot: [id number], Expiration date: 05/30/2024, Waste: 0 mL   Post-op Post injection exam found visual acuity of at least counting fingers. The patient tolerated the procedure well. There were no complications. The patient received written and verbal post procedure care education.            ASSESSMENT/PLAN:    ICD-10-CM   1. Central retinal vein occlusion with macular edema of right eye  H34.8110 OCT, Retina - OU - Both Eyes    Intravitreal Injection, Pharmacologic Agent - OD - Right Eye    aflibercept (EYLEA) SOLN 2 mg    2. Intermediate stage nonexudative age-related macular degeneration of both eyes  H35.3132     3. Essential hypertension  I10     4. Hypertensive retinopathy of both eyes  H35.033     5. Pseudophakia of both eyes  Z96.1     6. Dry eyes  H04.123       CRVO w/ CME, OD  - previous Dr. Luciana Axe  pt here for insurance reasons  - history of IVA OD x13 (last one 09.14.23 -- 14 wks) - s/p IVA OD #14 (12.19.23), #15 (01.30.24), #16 (03.12.24), #17 (04.23.24) -- IVA resistance - s/p IVE OD #1 (06.04.24) - BCVA 20/60 --  stable  - OCT shows OD: Persistent cystic changes temporal fovea and macula -- slightly improved, SRF and foveal contour stably improved at 6 weeks  - recommend IVE OD #2 today 07.16.24, with follow up in 6 weeks  - RBA of procedure discussed, questions answered - IVE informed consent obtained and signed 06.04.24  - see procedure note - Eylea approved for 2024 - f/u 6 wks -- DFE/OCT, possible injection  2. Age related macular degeneration, non-exudative, OU  - mild-intermediate stage  - The incidence, anatomy, and pathology of dry AMD, risk of progression, and the AREDS and AREDS 2 studies including smoking risks discussed with patient.  - Recommend amsler grid monitoring  3,4. Hypertensive retinopathy OU - discussed importance of tight BP control - continue to monitor  5. Pseudophakia OU  - s/p CE/IOL OU  - IOLs in good position, doing well  - continue to monitor  6. Dry eyes OU - recommend artificial tears and lubricating ointment as needed  Ophthalmic Meds Ordered this visit:  Meds ordered this encounter  Medications   aflibercept (EYLEA) SOLN 2 mg     Return in about 6 weeks (around 05/27/2023) for f/u CRVO OD, DFE, OCT.  There are no Patient Instructions on file for this visit.   Explained the diagnoses, plan, and follow up with the patient and they expressed understanding.  Patient expressed understanding of the importance of proper follow up care.   This document serves as a record of services personally performed by Karie Chimera, MD, PhD. It was created on their behalf by Gerilyn Nestle, COT an ophthalmic technician. The creation of this record is the provider's dictation and/or activities during the visit.    Electronically signed by:  Charlette Caffey, COT  04/15/23 11:07 PM  This document serves as a record of services personally performed by Karie Chimera, MD, PhD. It was created on their behalf by Glee Arvin. Manson Passey, OA an ophthalmic technician. The  creation of this record is the provider's dictation and/or activities during the visit.    Electronically signed by: Glee Arvin. Manson Passey, OA 04/15/23 11:07 PM  Karie Chimera, M.D., Ph.D. Diseases & Surgery of the Retina and Vitreous [facility]  I have reviewed the above documentation for accuracy and completeness, and I agree with the above. Karie Chimera, M.D., Ph.D. 04/15/23 11:15 PM   Abbreviations: M myopia (nearsighted); A astigmatism; H hyperopia (farsighted); P presbyopia; Mrx spectacle prescription;  CTL contact lenses; OD right eye; OS left eye; OU both eyes  XT exotropia; ET esotropia; PEK punctate epithelial keratitis; PEE punctate epithelial erosions; DES dry eye syndrome; MGD meibomian gland dysfunction; ATs artificial tears; PFAT's preservative free artificial tears; NSC nuclear sclerotic cataract; PSC posterior subcapsular cataract; ERM epi-retinal membrane; PVD posterior vitreous detachment; RD retinal detachment; DM diabetes mellitus; DR diabetic retinopathy; NPDR non-proliferative diabetic retinopathy; PDR proliferative diabetic retinopathy; CSME clinically significant macular edema; DME diabetic macular edema; dbh dot blot hemorrhages; CWS cotton wool spot; POAG primary open angle glaucoma; C/D cup-to-disc ratio; HVF humphrey visual field; GVF goldmann visual field; OCT optical coherence tomography; IOP intraocular pressure; BRVO Branch retinal vein occlusion; CRVO central retinal vein occlusion; CRAO central retinal artery occlusion; BRAO branch  retinal artery occlusion; RT retinal tear; SB scleral buckle; PPV pars plana vitrectomy; VH Vitreous hemorrhage; PRP panretinal laser photocoagulation; IVK intravitreal kenalog; VMT vitreomacular traction; MH Macular hole;  NVD neovascularization of the disc; NVE neovascularization elsewhere; AREDS age related eye disease study; ARMD age related macular degeneration; POAG primary open angle glaucoma; EBMD  epithelial/anterior basement membrane dystrophy; ACIOL anterior chamber intraocular lens; IOL intraocular lens; PCIOL posterior chamber intraocular lens; Phaco/IOL phacoemulsification with intraocular lens placement; PRK photorefractive keratectomy; LASIK laser assisted in situ keratomileusis; HTN hypertension; DM diabetes mellitus; COPD chronic obstructive pulmonary disease

## 2023-04-15 ENCOUNTER — Ambulatory Visit (INDEPENDENT_AMBULATORY_CARE_PROVIDER_SITE_OTHER): Payer: Medicare Other | Admitting: Ophthalmology

## 2023-04-15 ENCOUNTER — Encounter (INDEPENDENT_AMBULATORY_CARE_PROVIDER_SITE_OTHER): Payer: Self-pay | Admitting: Ophthalmology

## 2023-04-15 DIAGNOSIS — I10 Essential (primary) hypertension: Secondary | ICD-10-CM

## 2023-04-15 DIAGNOSIS — H04123 Dry eye syndrome of bilateral lacrimal glands: Secondary | ICD-10-CM

## 2023-04-15 DIAGNOSIS — Z961 Presence of intraocular lens: Secondary | ICD-10-CM

## 2023-04-15 DIAGNOSIS — H35033 Hypertensive retinopathy, bilateral: Secondary | ICD-10-CM

## 2023-04-15 DIAGNOSIS — H353132 Nonexudative age-related macular degeneration, bilateral, intermediate dry stage: Secondary | ICD-10-CM | POA: Diagnosis not present

## 2023-04-15 DIAGNOSIS — H34811 Central retinal vein occlusion, right eye, with macular edema: Secondary | ICD-10-CM

## 2023-04-15 MED ORDER — AFLIBERCEPT 2MG/0.05ML IZ SOLN FOR KALEIDOSCOPE
2.0000 mg | INTRAVITREAL | Status: AC | PRN
Start: 2023-04-15 — End: 2023-04-15
  Administered 2023-04-15: 2 mg via INTRAVITREAL

## 2023-04-17 DIAGNOSIS — K080 Exfoliation of teeth due to systemic causes: Secondary | ICD-10-CM | POA: Diagnosis not present

## 2023-04-22 DIAGNOSIS — M25512 Pain in left shoulder: Secondary | ICD-10-CM | POA: Diagnosis not present

## 2023-04-22 DIAGNOSIS — M25511 Pain in right shoulder: Secondary | ICD-10-CM | POA: Diagnosis not present

## 2023-04-30 ENCOUNTER — Telehealth (INDEPENDENT_AMBULATORY_CARE_PROVIDER_SITE_OTHER): Payer: Self-pay | Admitting: *Deleted

## 2023-04-30 NOTE — Telephone Encounter (Signed)
Pt left vm and she is concerned about weight loss. States she weight 110 May of this year and today she weighs 100.

## 2023-04-30 NOTE — Telephone Encounter (Signed)
Pt left number to call back on [phone]

## 2023-05-01 NOTE — Telephone Encounter (Signed)
FYI - Pt wants to know if she needs to be concerned that she has lost 10lbs since may. She said no change in appetite. She called pcp and was told to call back if weight got any lower. She states she was weighed on two different scales. One at pcp office in may and at home the other day. She states no problems with colitis, no diarrhea. Last seen 06/06/22 and has upcoming appt on 07/14/23. Should she move up appt? Everytime she goes to doctor "it sets her back two days" has a lot of pain. Does not have any abdominal pain. I offered to have her appt moved up but she She states she is going to follow up with pcp Dr. Olena Leatherwood if she loses any more weight.

## 2023-05-01 NOTE — Telephone Encounter (Signed)
Tried to call to get more information and no answer.

## 2023-05-05 NOTE — Telephone Encounter (Signed)
Discussed with patient per [facility] -  it is  fine to see [place] in October if she is seeing PCP soon as she had recent EGD/Colonoscopy with [place]. Thanks!

## 2023-05-22 NOTE — Progress Notes (Signed)
[facility] - Clinic Note  05/28/2023     CHIEF COMPLAINT Patient presents for Retina Follow Up   HISTORY OF PRESENT ILLNESS: Brenda Reynolds is a 89 y.o. female who presents to the clinic today for:   HPI     Retina Follow Up   Patient presents with  CRVO/BRVO.  In right eye.  This started 6 weeks ago.  I, the attending physician,  performed the HPI with the patient and updated documentation appropriately.        Comments   Patient here for 6 weeks retina follow up for CRVO OD. Patient states vision  can tell it is time for injection. No eye pain. Hasn't had. Been losing weight. Started antibiotic yesterday for UTI for 7 days.      Last edited by Rennis Chris, MD on 05/28/2023  4:55 PM.    Pt states her glasses are about 87 years old, she feels like her right eye vision is decreased  Referring physician: Toma Deiters, MD [address] [place],  [place] [address]  HISTORICAL INFORMATION:   Selected notes from the MEDICAL RECORD NUMBER Referred by Dr. Luciana Axe due to insurance network LEE:  Ocular Hx- PMH-    CURRENT MEDICATIONS: No current outpatient medications on file. (Ophthalmic Drugs)   No current facility-administered medications for this visit. (Ophthalmic Drugs)   Current Outpatient Medications (Other)  Medication Sig   acetaminophen (TYLENOL) 500 MG tablet Take 500-1,000 mg by mouth every 6 (six) hours as needed for moderate pain.   alendronate (FOSAMAX) 70 MG tablet Take 70 mg by mouth every Tuesday.   calcium-vitamin D (OSCAL WITH D) 500-200 MG-UNIT tablet Take 1 tablet by mouth 3 (three) times daily.   colestipol (COLESTID) 1 g tablet TAKE ONE TABLET BY MOUTH EVERY MORNING, AT NOON AND AT BEDTIME   famotidine (PEPCID) 20 MG tablet Take 20 mg by mouth daily.   folic acid (FOLVITE) 400 MCG tablet Take 400 mcg by mouth daily.   gabapentin (NEURONTIN) 300 MG capsule Take 300 mg by mouth 3 (three) times daily.   glucosamine-chondroitin  500-400 MG tablet Take 1 tablet by mouth daily.   HYDROcodone-acetaminophen (NORCO/VICODIN) 5-325 MG tablet Take 1 tablet by mouth 2 (two) times daily as needed for moderate pain.   losartan (COZAAR) 100 MG tablet Take 100 mg by mouth daily.   Multiple Vitamin (MULTIVITAMIN) tablet Take 1 tablet by mouth daily.   Omega-3 Fatty Acids (FISH OIL) 1000 MG CAPS Take 2,000 mg by mouth daily.   omeprazole (PRILOSEC) 40 MG capsule Take 1 capsule (40 mg total) by mouth daily.   vitamin B-12 (CYANOCOBALAMIN) 500 MCG tablet Take 500 mcg by mouth daily.   vitamin C (ASCORBIC ACID) 500 MG tablet Take 1 tablet (500 mg total) by mouth daily.   No current facility-administered medications for this visit. (Other)   REVIEW OF SYSTEMS: ROS   Positive for: Gastrointestinal, Cardiovascular, Eyes Negative for: Constitutional, Neurological, Skin, Genitourinary, Musculoskeletal, HENT, Endocrine, Respiratory, Psychiatric, Allergic/Imm, Heme/Lymph Last edited by Laddie Aquas, COA on 05/28/2023  2:10 PM.     ALLERGIES Allergies  Allergen Reactions   Lidocaine Rash    Patch   PAST MEDICAL HISTORY Past Medical History:  Diagnosis Date   GERD (gastroesophageal reflux disease)    Hypertension    Intertrochanteric fracture of left femur (HCC)    Osteoporosis    Scoliosis    Past Surgical History:  Procedure Laterality Date   BIOPSY  03/09/2021   Procedure: BIOPSY;  Surgeon: Marguerita Merles, Reuel Boom, MD;  Location: [facility];  Service: Gastroenterology;;   CATARACT EXTRACTION     COLONOSCOPY     Per patient this was done by Dr. Teena Dunk   COLONOSCOPY WITH PROPOFOL N/A 03/09/2021   Procedure: COLONOSCOPY WITH PROPOFOL;  Surgeon: Dolores Frame, MD;  Location: [facility];  Service: Gastroenterology;  Laterality: N/A;  10:50   DILATION AND CURETTAGE OF UTERUS     ESOPHAGEAL DILATION N/A 03/09/2021   Procedure: ESOPHAGEAL DILATION;  Surgeon: Dolores Frame, MD;  Location: [facility];  Service: Gastroenterology;  Laterality: N/A;   ESOPHAGOGASTRODUODENOSCOPY (EGD) WITH PROPOFOL N/A 03/09/2021   Procedure: ESOPHAGOGASTRODUODENOSCOPY (EGD) WITH PROPOFOL;  Surgeon: Dolores Frame, MD;  Location: [facility];  Service: Gastroenterology;  Laterality: N/A;   EYE SURGERY     Bilateral cataracts   INTRAMEDULLARY (IM) NAIL INTERTROCHANTERIC Left 07/04/2019   Procedure: INTRAMEDULLARY (IM) NAIL INTERTROCHANTRIC;  Surgeon: Samson Frederic, MD;  Location: [facility];  Service: Orthopedics;  Laterality: Left;   TONSILLECTOMY     UPPER GASTROINTESTINAL ENDOSCOPY     Done by Dr. Gabriel Cirri   FAMILY HISTORY Family History  Problem Relation Age of Onset   Cancer Mother    SOCIAL HISTORY Social History   Tobacco Use   Smoking status: Former    Passive exposure: Past   Smokeless tobacco: Never  Vaping Use   Vaping status: Never Used  Substance Use Topics   Alcohol use: Yes    Alcohol/week: 1.0 standard drink of alcohol    Types: 1 Glasses of wine per week   Drug use: Not Currently       OPHTHALMIC EXAM:  Base Eye Exam     Visual Acuity (Snellen - Linear)       Right Left   Dist cc 20/70 -2 20/40 -1   Dist ph cc 20/60 -2 20/30 -2    Correction: Glasses         Tonometry (Tonopen, 2:07 PM)       Right Left   Pressure 17 15         Pupils       Dark Light Shape React APD   Right 2 2 Round Minimal None   Left 2 2 Round Minimal None         Visual Fields (Counting fingers)       Left Right    Full Full         Extraocular Movement       Right Left    Full, Ortho Full, Ortho         Neuro/Psych     Oriented x3: Yes   Mood/Affect: Normal         Dilation     Both eyes: 1.0% Mydriacyl, 2.5% Phenylephrine @ 2:07 PM           Slit Lamp and Fundus Exam     External Exam       Right Left   External Normal Normal         Slit Lamp Exam       Right Left   Lids/Lashes Dermatochalasis - upper lid, Meibomian  gland dysfunction Dermatochalasis - upper lid, Meibomian gland dysfunction   Conjunctiva/Sclera White and quiet White and quiet   Cornea arcus, 2+ Punctate epithelial erosions, Descemet's folds, tear film debris arcus, 1+ Punctate epithelial erosions, trace Descemet's folds, tear film debris   Anterior Chamber deep and  clear deep and clear   Iris Round and dilated Round and dilated   Lens 3 piece PC IOL in good position with open PC 3 piece PC IOL in good position with open PC   Anterior Vitreous Vitreous syneresis, vitreous condensations, Posterior vitreous detachment Posterior vitreous detachment, vitreous condensations         Fundus Exam       Right Left   Disc Pink and Sharp, +hyperemia, vascular loops mild Pallor, Sharp rim, central hyperemia, mild PPA   C/D Ratio 0.3 0.2   Macula hazy view, Blunted foveal reflex, +edema and scattered IRH -- improved, +drusen Flat, Good foveal reflex, RPE mottling and clumping, Drusen, No heme or edema   Vessels attenuated, Tortuous, old CRVO attenuated, Tortuous   Periphery Attached, mild reticular degeneration Attached, mild reticular degeneration, No heme           Refraction     Wearing Rx       Sphere Cylinder Axis Add   Right -0.75 +2.00 178 +2.50   Left -0.50 +2.00 170 +2.50           IMAGING AND PROCEDURES  Imaging and Procedures for 05/28/2023  OCT, Retina - OU - Both Eyes       Right Eye Quality was borderline. Scan locations included subfoveal. Central Foveal Thickness: 205. Progression has improved. Findings include abnormal foveal contour, intraretinal fluid, subretinal fluid, outer retinal atrophy (Shallow cystic changes temporal fovea and macula -- slightly improved, SRF and foveal contour stably improved).   Left Eye Quality was good. Scan locations included subfoveal. Central Foveal Thickness: 257. Progression has been stable. Findings include normal foveal contour, no IRF, no SRF, retinal drusen .    Notes *Images captured and stored on drive  Diagnosis / Impression:  OD: shallow cystic changes temporal fovea and macula -- slightly improved, SRF and foveal contour stably improved OS: NFP, no IRF/SRF  Clinical management:  See below  Abbreviations: NFP - Normal foveal profile. CME - cystoid macular edema. PED - pigment epithelial detachment. IRF - intraretinal fluid. SRF - subretinal fluid. EZ - ellipsoid zone. ERM - epiretinal membrane. ORA - outer retinal atrophy. ORT - outer retinal tubulation. SRHM - subretinal hyper-reflective material. IRHM - intraretinal hyper-reflective material      Intravitreal Injection, Pharmacologic Agent - OD - Right Eye       Time Out 05/28/2023. 3:04 PM. Confirmed correct patient, procedure, site, and patient consented.   Anesthesia Topical anesthesia was used. Anesthetic medications included Lidocaine 2%, Proparacaine 0.5%.   Procedure Preparation included 5% betadine to ocular surface, eyelid speculum. A (32g) needle was used.   Injection: 2 mg aflibercept 2 MG/0.05ML   Route: Intravitreal, Site: Right Eye   NDC: L6038910, Lot: [id number], Expiration date: 08/29/2024, Waste: 0 mL   Post-op Post injection exam found visual acuity of at least counting fingers. The patient tolerated the procedure well. There were no complications. The patient received written and verbal post procedure care education.             ASSESSMENT/PLAN:    ICD-10-CM   1. Central retinal vein occlusion with macular edema of right eye  H34.8110 OCT, Retina - OU - Both Eyes    Intravitreal Injection, Pharmacologic Agent - OD - Right Eye    aflibercept (EYLEA) SOLN 2 mg    2. Intermediate stage nonexudative age-related macular degeneration of both eyes  H35.3132     3. Essential hypertension  I10     4.  Hypertensive retinopathy of both eyes  H35.033     5. Pseudophakia of both eyes  Z96.1     6. Dry eyes  H04.123      CRVO w/ CME, OD  -  previous Dr. Luciana Axe pt here for insurance reasons  - history of IVA OD x13 (last one 09.14.23 -- 14 wks) - s/p IVA OD #14 (12.19.23), #15 (01.30.24), #16 (03.12.24), #17 (04.23.24) -- IVA resistance - s/p IVE OD #1 (06.04.24), #2 (07.16.24) - BCVA 20/60 -- stable  - OCT shows OD: Persistent cystic changes temporal fovea and macula -- slightly improved, SRF and foveal contour stably improved at 6 weeks  - recommend IVE OD #3 today 08.28.24, with follow up in 6 weeks  - RBA of procedure discussed, questions answered - IVE informed consent obtained and signed 06.04.24  - see procedure note - Eylea approved for 2024 - f/u 6 wks -- DFE/OCT, possible injection  2. Age related macular degeneration, non-exudative, OU  - mild-intermediate stage  - The incidence, anatomy, and pathology of dry AMD, risk of progression, and the AREDS and AREDS 2 studies including smoking risks discussed with patient.  - Recommend amsler grid monitoring  3,4. Hypertensive retinopathy OU - discussed importance of tight BP control - continue to monitor  5. Pseudophakia OU  - s/p CE/IOL OU  - IOLs in good position, doing well  - continue to monitor  6. Dry eyes OU - recommend artificial tears and lubricating ointment as needed  Ophthalmic Meds Ordered this visit:  Meds ordered this encounter  Medications   aflibercept (EYLEA) SOLN 2 mg     Return in about 6 weeks (around 07/09/2023) for f/u CRVO OD, DFE, OCT.  There are no Patient Instructions on file for this visit.   Explained the diagnoses, plan, and follow up with the patient and they expressed understanding.  Patient expressed understanding of the importance of proper follow up care.   This document serves as a record of services personally performed by Karie Chimera, MD, PhD. It was created on their behalf by De Blanch, an ophthalmic technician. The creation of this record is the provider's dictation and/or activities during the visit.     Electronically signed by: De Blanch, OA, 05/29/23  2:08 PM  This document serves as a record of services personally performed by Karie Chimera, MD, PhD. It was created on their behalf by Glee Arvin. Manson Passey, OA an ophthalmic technician. The creation of this record is the provider's dictation and/or activities during the visit.    Electronically signed by: Glee Arvin. Manson Passey, OA 05/29/23 2:08 PM  Karie Chimera, M.D., Ph.D. Diseases & Surgery of the Retina and Vitreous [facility]  I have reviewed the above documentation for accuracy and completeness, and I agree with the above. Karie Chimera, M.D., Ph.D. 05/29/23 2:09 PM  Abbreviations: M myopia (nearsighted); A astigmatism; H hyperopia (farsighted); P presbyopia; Mrx spectacle prescription;  CTL contact lenses; OD right eye; OS left eye; OU both eyes  XT exotropia; ET esotropia; PEK punctate epithelial keratitis; PEE punctate epithelial erosions; DES dry eye syndrome; MGD meibomian gland dysfunction; ATs artificial tears; PFAT's preservative free artificial tears; NSC nuclear sclerotic cataract; PSC posterior subcapsular cataract; ERM epi-retinal membrane; PVD posterior vitreous detachment; RD retinal detachment; DM diabetes mellitus; DR diabetic retinopathy; NPDR non-proliferative diabetic retinopathy; PDR proliferative diabetic retinopathy; CSME clinically significant macular edema; DME diabetic macular edema; dbh dot blot hemorrhages; CWS cotton wool spot; POAG  primary open angle glaucoma; C/D cup-to-disc ratio; HVF humphrey visual field; GVF goldmann visual field; OCT optical coherence tomography; IOP intraocular pressure; BRVO Branch retinal vein occlusion; CRVO central retinal vein occlusion; CRAO central retinal artery occlusion; BRAO branch retinal artery occlusion; RT retinal tear; SB scleral buckle; PPV pars plana vitrectomy; VH Vitreous hemorrhage; PRP panretinal laser photocoagulation; IVK intravitreal  kenalog; VMT vitreomacular traction; MH Macular hole;  NVD neovascularization of the disc; NVE neovascularization elsewhere; AREDS age related eye disease study; ARMD age related macular degeneration; POAG primary open angle glaucoma; EBMD epithelial/anterior basement membrane dystrophy; ACIOL anterior chamber intraocular lens; IOL intraocular lens; PCIOL posterior chamber intraocular lens; Phaco/IOL phacoemulsification with intraocular lens placement; PRK photorefractive keratectomy; LASIK laser assisted in situ keratomileusis; HTN hypertension; DM diabetes mellitus; COPD chronic obstructive pulmonary disease

## 2023-05-28 ENCOUNTER — Ambulatory Visit (INDEPENDENT_AMBULATORY_CARE_PROVIDER_SITE_OTHER): Payer: Medicare Other | Admitting: Ophthalmology

## 2023-05-28 ENCOUNTER — Encounter (INDEPENDENT_AMBULATORY_CARE_PROVIDER_SITE_OTHER): Payer: Self-pay | Admitting: Ophthalmology

## 2023-05-28 ENCOUNTER — Encounter (INDEPENDENT_AMBULATORY_CARE_PROVIDER_SITE_OTHER): Payer: Self-pay | Admitting: *Deleted

## 2023-05-28 DIAGNOSIS — I10 Essential (primary) hypertension: Secondary | ICD-10-CM

## 2023-05-28 DIAGNOSIS — H04123 Dry eye syndrome of bilateral lacrimal glands: Secondary | ICD-10-CM

## 2023-05-28 DIAGNOSIS — H35033 Hypertensive retinopathy, bilateral: Secondary | ICD-10-CM

## 2023-05-28 DIAGNOSIS — H353132 Nonexudative age-related macular degeneration, bilateral, intermediate dry stage: Secondary | ICD-10-CM | POA: Diagnosis not present

## 2023-05-28 DIAGNOSIS — Z961 Presence of intraocular lens: Secondary | ICD-10-CM

## 2023-05-28 DIAGNOSIS — H34811 Central retinal vein occlusion, right eye, with macular edema: Secondary | ICD-10-CM | POA: Diagnosis not present

## 2023-05-28 MED ORDER — AFLIBERCEPT 2MG/0.05ML IZ SOLN FOR KALEIDOSCOPE
2.0000 mg | INTRAVITREAL | Status: AC | PRN
Start: 2023-05-28 — End: 2023-05-28
  Administered 2023-05-28: 2 mg via INTRAVITREAL

## 2023-06-10 DIAGNOSIS — N1831 Chronic kidney disease, stage 3a: Secondary | ICD-10-CM | POA: Diagnosis not present

## 2023-06-10 DIAGNOSIS — I10 Essential (primary) hypertension: Secondary | ICD-10-CM | POA: Diagnosis not present

## 2023-06-10 DIAGNOSIS — M25619 Stiffness of unspecified shoulder, not elsewhere classified: Secondary | ICD-10-CM | POA: Diagnosis not present

## 2023-06-10 DIAGNOSIS — R32 Unspecified urinary incontinence: Secondary | ICD-10-CM | POA: Diagnosis not present

## 2023-06-16 ENCOUNTER — Ambulatory Visit (INDEPENDENT_AMBULATORY_CARE_PROVIDER_SITE_OTHER): Payer: Medicare Other | Admitting: Gastroenterology

## 2023-07-03 ENCOUNTER — Encounter (INDEPENDENT_AMBULATORY_CARE_PROVIDER_SITE_OTHER): Payer: Medicare Other | Admitting: Gastroenterology

## 2023-07-08 ENCOUNTER — Ambulatory Visit (INDEPENDENT_AMBULATORY_CARE_PROVIDER_SITE_OTHER): Payer: Medicare Other | Admitting: Gastroenterology

## 2023-07-09 ENCOUNTER — Encounter (INDEPENDENT_AMBULATORY_CARE_PROVIDER_SITE_OTHER): Payer: Medicare Other | Admitting: Ophthalmology

## 2023-07-09 DIAGNOSIS — H04123 Dry eye syndrome of bilateral lacrimal glands: Secondary | ICD-10-CM

## 2023-07-09 DIAGNOSIS — H35033 Hypertensive retinopathy, bilateral: Secondary | ICD-10-CM

## 2023-07-09 DIAGNOSIS — H353132 Nonexudative age-related macular degeneration, bilateral, intermediate dry stage: Secondary | ICD-10-CM

## 2023-07-09 DIAGNOSIS — I10 Essential (primary) hypertension: Secondary | ICD-10-CM

## 2023-07-09 DIAGNOSIS — H34811 Central retinal vein occlusion, right eye, with macular edema: Secondary | ICD-10-CM

## 2023-07-09 DIAGNOSIS — Z961 Presence of intraocular lens: Secondary | ICD-10-CM

## 2023-07-09 NOTE — Progress Notes (Signed)
[facility] - Clinic Note  07/15/2023     CHIEF COMPLAINT Patient presents for Retina Follow Up   HISTORY OF PRESENT ILLNESS: Brenda Reynolds is a 89 y.o. female who presents to the clinic today for:   HPI     Retina Follow Up   Patient presents with  CRVO/BRVO.  In right eye.  This started 6 weeks ago.  Duration of 6 weeks.  Since onset it is stable.  I, the attending physician,  performed the HPI with the patient and updated documentation appropriately.        Comments   6 week retina follow up CRVO OD and I'VE OD pt is reporting vision seems about the same she has been having some floaters but denies any flashes       Last edited by Rennis Chris, MD on 07/16/2023 12:46 AM.    Pt states her eyes are not bothering her, she reads a lot and has no problems  Referring physician: Toma Deiters, MD [address] [place],  [place] [address]  HISTORICAL INFORMATION:   Selected notes from the MEDICAL RECORD NUMBER Referred by Dr. Luciana Axe due to insurance network LEE:  Ocular Hx- PMH-    CURRENT MEDICATIONS: No current outpatient medications on file. (Ophthalmic Drugs)   No current facility-administered medications for this visit. (Ophthalmic Drugs)   Current Outpatient Medications (Other)  Medication Sig   acetaminophen (TYLENOL) 500 MG tablet Take 500-1,000 mg by mouth every 6 (six) hours as needed for moderate pain.   alendronate (FOSAMAX) 70 MG tablet Take 70 mg by mouth every Tuesday.   calcium-vitamin D (OSCAL WITH D) 500-200 MG-UNIT tablet Take 1 tablet by mouth 3 (three) times daily.   colestipol (COLESTID) 1 g tablet TAKE ONE TABLET BY MOUTH EVERY MORNING, AT NOON AND AT BEDTIME   famotidine (PEPCID) 20 MG tablet Take 20 mg by mouth daily.   folic acid (FOLVITE) 400 MCG tablet Take 400 mcg by mouth daily.   gabapentin (NEURONTIN) 300 MG capsule Take 300 mg by mouth 3 (three) times daily.   glucosamine-chondroitin 500-400 MG tablet Take 1  tablet by mouth daily.   HYDROcodone-acetaminophen (NORCO/VICODIN) 5-325 MG tablet Take 1 tablet by mouth 2 (two) times daily as needed for moderate pain.   losartan (COZAAR) 100 MG tablet Take 100 mg by mouth daily.   Multiple Vitamin (MULTIVITAMIN) tablet Take 1 tablet by mouth daily.   Omega-3 Fatty Acids (FISH OIL) 1000 MG CAPS Take 2,000 mg by mouth daily.   omeprazole (PRILOSEC) 40 MG capsule Take 1 capsule (40 mg total) by mouth daily.   vitamin B-12 (CYANOCOBALAMIN) 500 MCG tablet Take 500 mcg by mouth daily.   vitamin C (ASCORBIC ACID) 500 MG tablet Take 1 tablet (500 mg total) by mouth daily.   No current facility-administered medications for this visit. (Other)   REVIEW OF SYSTEMS:   ALLERGIES Allergies  Allergen Reactions   Lidocaine Rash    Patch   PAST MEDICAL HISTORY Past Medical History:  Diagnosis Date   GERD (gastroesophageal reflux disease)    Hypertension    Intertrochanteric fracture of left femur (HCC)    Osteoporosis    Scoliosis    Past Surgical History:  Procedure Laterality Date   BIOPSY  03/09/2021   Procedure: BIOPSY;  Surgeon: Dolores Frame, MD;  Location: [facility];  Service: Gastroenterology;;   CATARACT EXTRACTION     COLONOSCOPY     Per patient this  was done by Dr. Teena Dunk   COLONOSCOPY WITH PROPOFOL N/A 03/09/2021   Procedure: COLONOSCOPY WITH PROPOFOL;  Surgeon: Dolores Frame, MD;  Location: [facility];  Service: Gastroenterology;  Laterality: N/A;  10:50   DILATION AND CURETTAGE OF UTERUS     ESOPHAGEAL DILATION N/A 03/09/2021   Procedure: ESOPHAGEAL DILATION;  Surgeon: Dolores Frame, MD;  Location: [facility];  Service: Gastroenterology;  Laterality: N/A;   ESOPHAGOGASTRODUODENOSCOPY (EGD) WITH PROPOFOL N/A 03/09/2021   Procedure: ESOPHAGOGASTRODUODENOSCOPY (EGD) WITH PROPOFOL;  Surgeon: Dolores Frame, MD;  Location: [facility];  Service: Gastroenterology;  Laterality: N/A;   EYE  SURGERY     Bilateral cataracts   INTRAMEDULLARY (IM) NAIL INTERTROCHANTERIC Left 07/04/2019   Procedure: INTRAMEDULLARY (IM) NAIL INTERTROCHANTRIC;  Surgeon: Samson Frederic, MD;  Location: [facility];  Service: Orthopedics;  Laterality: Left;   TONSILLECTOMY     UPPER GASTROINTESTINAL ENDOSCOPY     Done by Dr. Gabriel Cirri   FAMILY HISTORY Family History  Problem Relation Age of Onset   Cancer Mother    SOCIAL HISTORY Social History   Tobacco Use   Smoking status: Former    Passive exposure: Past   Smokeless tobacco: Never  Vaping Use   Vaping status: Never Used  Substance Use Topics   Alcohol use: Yes    Alcohol/week: 1.0 standard drink of alcohol    Types: 1 Glasses of wine per week   Drug use: Not Currently       OPHTHALMIC EXAM:  Base Eye Exam     Visual Acuity (Snellen - Linear)       Right Left   Dist cc 20/70 20/50 -2   Dist ph cc 20/60 20/30    Correction: Glasses         Tonometry (Tonopen, 2:31 PM)       Right Left   Pressure 18 18         Pupils       Pupils Dark Light Shape React APD   Right PERRL 2 2 Round Minimal None   Left PERRL 2 2 Round Minimal None         Visual Fields       Left Right    Full Full         Extraocular Movement       Right Left    Full, Ortho Full, Ortho         Neuro/Psych     Oriented x3: Yes   Mood/Affect: Normal         Dilation     Both eyes: 2.5% Phenylephrine @ 2:31 PM           Slit Lamp and Fundus Exam     External Exam       Right Left   External Normal Normal         Slit Lamp Exam       Right Left   Lids/Lashes Dermatochalasis - upper lid, Meibomian gland dysfunction Dermatochalasis - upper lid, Meibomian gland dysfunction   Conjunctiva/Sclera White and quiet White and quiet   Cornea arcus, 2+ Punctate epithelial erosions, Descemet's folds, tear film debris arcus, 1+ Punctate epithelial erosions, trace Descemet's folds, tear film debris   Anterior Chamber deep and  clear deep and clear   Iris Round and dilated Round and dilated   Lens 3 piece PC IOL in good position with open PC 3 piece PC IOL in good position with open PC   Anterior  Vitreous Vitreous syneresis, vitreous condensations, Posterior vitreous detachment Posterior vitreous detachment, vitreous condensations         Fundus Exam       Right Left   Disc Pink and Sharp, +hyperemia, vascular loops mild Pallor, Sharp rim, central hyperemia, mild PPA   C/D Ratio 0.3 0.2   Macula hazy view, Blunted foveal reflex, +edema and scattered IRH -- improved, +drusen Flat, Good foveal reflex, RPE mottling and clumping, Drusen, No heme or edema   Vessels attenuated, Tortuous, old CRVO attenuated, Tortuous   Periphery Attached, mild reticular degeneration Attached, mild reticular degeneration, No heme           Refraction     Wearing Rx       Sphere Cylinder Axis Add   Right -0.75 +2.00 178 +2.50   Left -0.50 +2.00 170 +2.50           IMAGING AND PROCEDURES  Imaging and Procedures for 07/15/2023  OCT, Retina - OU - Both Eyes       Right Eye Quality was borderline. Scan locations included subfoveal. Central Foveal Thickness: 209. Progression has improved. Findings include abnormal foveal contour, intraretinal fluid, subretinal fluid, outer retinal atrophy (Shallow cystic changes temporal fovea and macula -- slightly improved, SRF and foveal contour stably improved).   Left Eye Quality was good. Scan locations included subfoveal. Central Foveal Thickness: 255. Progression has been stable. Findings include normal foveal contour, no IRF, no SRF, retinal drusen .   Notes *Images captured and stored on drive  Diagnosis / Impression:  OD: shallow cystic changes temporal fovea and macula -- slightly improved, SRF and foveal contour stably improved OS: NFP, no IRF/SRF  Clinical management:  See below  Abbreviations: NFP - Normal foveal profile. CME - cystoid macular edema. PED - pigment  epithelial detachment. IRF - intraretinal fluid. SRF - subretinal fluid. EZ - ellipsoid zone. ERM - epiretinal membrane. ORA - outer retinal atrophy. ORT - outer retinal tubulation. SRHM - subretinal hyper-reflective material. IRHM - intraretinal hyper-reflective material      Intravitreal Injection, Pharmacologic Agent - OD - Right Eye       Time Out 07/15/2023. 3:05 PM. Confirmed correct patient, procedure, site, and patient consented.   Anesthesia Topical anesthesia was used. Anesthetic medications included Lidocaine 2%, Proparacaine 0.5%.   Procedure Preparation included 5% betadine to ocular surface, eyelid speculum. A (32g) needle was used.   Injection: 2 mg aflibercept 2 MG/0.05ML   Route: Intravitreal, Site: Right Eye   NDC: [id number], Lot: [id number], Expiration date: 08/29/2024, Waste: 0 mL   Post-op Post injection exam found visual acuity of at least counting fingers. The patient tolerated the procedure well. There were no complications. The patient received written and verbal post procedure care education.            ASSESSMENT/PLAN:    ICD-10-CM   1. Central retinal vein occlusion with macular edema of right eye  H34.8110 OCT, Retina - OU - Both Eyes    Intravitreal Injection, Pharmacologic Agent - OD - Right Eye    aflibercept (EYLEA) SOLN 2 mg    2. Intermediate stage nonexudative age-related macular degeneration of both eyes  H35.3132     3. Essential hypertension  I10     4. Hypertensive retinopathy of both eyes  H35.033     5. Pseudophakia of both eyes  Z96.1     6. Dry eyes  H04.123      CRVO w/ CME, OD  - previous Dr.  Rankin pt here for insurance reasons  - history of IVA OD x13 (last one 09.14.23 -- 14 wks) - s/p IVA OD #14 (12.19.23), #15 (01.30.24), #16 (03.12.24), #17 (04.23.24) -- IVA resistance - s/p IVE OD #1 (06.04.24), #2 (07.16.24), #3 (08.28.24) - BCVA 20/60 -- stable  - OCT shows OD: shallow cystic changes temporal fovea and  macula -- slightly improved, SRF and foveal contour stably improved; OS: NFP, no IRF/SRF at 7 weeks  - recommend IVE OD #4 today 10.15.24, with follow up in 6 weeks  - RBA of procedure discussed, questions answered - IVE informed consent obtained and signed 06.04.24  - see procedure note - Eylea approved for 2024 - f/u 6 wks -- DFE/OCT, possible injection  2. Age related macular degeneration, non-exudative, OU  - mild-intermediate stage  - The incidence, anatomy, and pathology of dry AMD, risk of progression, and the AREDS and AREDS 2 studies including smoking risks discussed with patient.  - Recommend amsler grid monitoring  3,4. Hypertensive retinopathy OU - discussed importance of tight BP control - continue to monitor  5. Pseudophakia OU  - s/p CE/IOL OU  - IOLs in good position, doing well  - continue to monitor  6. Dry eyes OU - recommend artificial tears and lubricating ointment as needed  Ophthalmic Meds Ordered this visit:  Meds ordered this encounter  Medications   aflibercept (EYLEA) SOLN 2 mg     Return in about 6 weeks (around 08/26/2023) for f/u CRVO OD, DFE, OCT.  There are no Patient Instructions on file for this visit.   Explained the diagnoses, plan, and follow up with the patient and they expressed understanding.  Patient expressed understanding of the importance of proper follow up care.   This document serves as a record of services personally performed by Karie Chimera, MD, PhD. It was created on their behalf by Charlette Caffey, COT an ophthalmic technician. The creation of this record is the provider's dictation and/or activities during the visit.    Electronically signed by:  Charlette Caffey, COT  07/16/23 12:46 AM  This document serves as a record of services personally performed by Karie Chimera, MD, PhD. It was created on their behalf by Glee Arvin. Manson Passey, OA an ophthalmic technician. The creation of this record is the provider's  dictation and/or activities during the visit.    Electronically signed by: Glee Arvin. Manson Passey, OA 07/16/23 12:46 AM  Karie Chimera, M.D., Ph.D. Diseases & Surgery of the Retina and Vitreous [facility]  I have reviewed the above documentation for accuracy and completeness, and I agree with the above. Karie Chimera, M.D., Ph.D. 07/16/23 12:48 AM  Abbreviations: M myopia (nearsighted); A astigmatism; H hyperopia (farsighted); P presbyopia; Mrx spectacle prescription;  CTL contact lenses; OD right eye; OS left eye; OU both eyes  XT exotropia; ET esotropia; PEK punctate epithelial keratitis; PEE punctate epithelial erosions; DES dry eye syndrome; MGD meibomian gland dysfunction; ATs artificial tears; PFAT's preservative free artificial tears; NSC nuclear sclerotic cataract; PSC posterior subcapsular cataract; ERM epi-retinal membrane; PVD posterior vitreous detachment; RD retinal detachment; DM diabetes mellitus; DR diabetic retinopathy; NPDR non-proliferative diabetic retinopathy; PDR proliferative diabetic retinopathy; CSME clinically significant macular edema; DME diabetic macular edema; dbh dot blot hemorrhages; CWS cotton wool spot; POAG primary open angle glaucoma; C/D cup-to-disc ratio; HVF humphrey visual field; GVF goldmann visual field; OCT optical coherence tomography; IOP intraocular pressure; BRVO Branch retinal vein occlusion; CRVO central retinal  vein occlusion; CRAO central retinal artery occlusion; BRAO branch retinal artery occlusion; RT retinal tear; SB scleral buckle; PPV pars plana vitrectomy; VH Vitreous hemorrhage; PRP panretinal laser photocoagulation; IVK intravitreal kenalog; VMT vitreomacular traction; MH Macular hole;  NVD neovascularization of the disc; NVE neovascularization elsewhere; AREDS age related eye disease study; ARMD age related macular degeneration; POAG primary open angle glaucoma; EBMD epithelial/anterior basement membrane dystrophy; ACIOL  anterior chamber intraocular lens; IOL intraocular lens; PCIOL posterior chamber intraocular lens; Phaco/IOL phacoemulsification with intraocular lens placement; PRK photorefractive keratectomy; LASIK laser assisted in situ keratomileusis; HTN hypertension; DM diabetes mellitus; COPD chronic obstructive pulmonary disease

## 2023-07-11 ENCOUNTER — Encounter (INDEPENDENT_AMBULATORY_CARE_PROVIDER_SITE_OTHER): Payer: Medicare Other | Admitting: Ophthalmology

## 2023-07-14 ENCOUNTER — Ambulatory Visit (INDEPENDENT_AMBULATORY_CARE_PROVIDER_SITE_OTHER): Payer: Medicare Other | Admitting: Gastroenterology

## 2023-07-15 ENCOUNTER — Encounter (INDEPENDENT_AMBULATORY_CARE_PROVIDER_SITE_OTHER): Payer: Self-pay | Admitting: Ophthalmology

## 2023-07-15 ENCOUNTER — Ambulatory Visit (INDEPENDENT_AMBULATORY_CARE_PROVIDER_SITE_OTHER): Payer: Medicare Other | Admitting: Ophthalmology

## 2023-07-15 DIAGNOSIS — Z961 Presence of intraocular lens: Secondary | ICD-10-CM

## 2023-07-15 DIAGNOSIS — H353132 Nonexudative age-related macular degeneration, bilateral, intermediate dry stage: Secondary | ICD-10-CM

## 2023-07-15 DIAGNOSIS — H34811 Central retinal vein occlusion, right eye, with macular edema: Secondary | ICD-10-CM | POA: Diagnosis not present

## 2023-07-15 DIAGNOSIS — H04123 Dry eye syndrome of bilateral lacrimal glands: Secondary | ICD-10-CM

## 2023-07-15 DIAGNOSIS — H35033 Hypertensive retinopathy, bilateral: Secondary | ICD-10-CM

## 2023-07-15 DIAGNOSIS — I10 Essential (primary) hypertension: Secondary | ICD-10-CM

## 2023-07-15 MED ORDER — AFLIBERCEPT 2MG/0.05ML IZ SOLN FOR KALEIDOSCOPE
2.0000 mg | INTRAVITREAL | Status: AC | PRN
Start: 2023-07-15 — End: 2023-07-15
  Administered 2023-07-15: 2 mg via INTRAVITREAL

## 2023-07-21 ENCOUNTER — Encounter (INDEPENDENT_AMBULATORY_CARE_PROVIDER_SITE_OTHER): Payer: Self-pay | Admitting: Gastroenterology

## 2023-07-21 ENCOUNTER — Ambulatory Visit (INDEPENDENT_AMBULATORY_CARE_PROVIDER_SITE_OTHER): Payer: Medicare Other | Admitting: Gastroenterology

## 2023-07-21 VITALS — BP 179/84 | HR 102 | Temp 97.9°F | Ht 59.0 in | Wt 106.4 lb

## 2023-07-21 DIAGNOSIS — K52831 Collagenous colitis: Secondary | ICD-10-CM

## 2023-07-21 DIAGNOSIS — K921 Melena: Secondary | ICD-10-CM | POA: Insufficient documentation

## 2023-07-21 DIAGNOSIS — K219 Gastro-esophageal reflux disease without esophagitis: Secondary | ICD-10-CM | POA: Diagnosis not present

## 2023-07-21 DIAGNOSIS — R1310 Dysphagia, unspecified: Secondary | ICD-10-CM | POA: Diagnosis not present

## 2023-07-21 DIAGNOSIS — Z8711 Personal history of peptic ulcer disease: Secondary | ICD-10-CM

## 2023-07-21 MED ORDER — COLESTIPOL HCL 1 G PO TABS: 1.0000 g | ORAL_TABLET | Freq: Every day | ORAL | 3 refills | Status: AC

## 2023-07-21 MED ORDER — PANTOPRAZOLE SODIUM 40 MG PO TBEC: 40.0000 mg | DELAYED_RELEASE_TABLET | Freq: Every day | ORAL | 1 refills | Status: DC

## 2023-07-21 NOTE — Progress Notes (Signed)
Referring Provider: Toma Deiters, MD Primary Care Physician:  Toma Deiters, MD Primary GI Physician: Dr. Levon Hedger   Chief Complaint  Patient presents with   dark stools    Had 3 dark stools 2 days ago.    HPI:   Brenda Reynolds is a 89 y.o. female with past medical history of GERD, HTN, collagenous colitis and osteoporosis   Patient presenting today for follow up of diarrhea secondary to collagenous colitis, GERD/dysphagia and now with black stools   Last seen September 2023, at that time having mostly soft mushy stools, occasional harder stool.  Taking cholesterol daily to twice daily, occasionally 3 times daily.  Usually having 1-2 bowel movements per day.  Dysphagia improved after EGD.  She did note sensation of something in her throat and needing to cough up phlegm at times.  Denied any heartburn.  Recommended to stop famotidine, start omeprazole 40 mg daily, chewing precautions, continue colestipol.  Present:  Patient notes she is not having diarrhea currently. She is taking colestipol regularly now. She forgets often, tries to take it a few times per week. Having a BM usually once per day, sometimes 2. Stools are usually solid. Sometimes has harder stools but this is on rare occasion. Notes occasional toilet tissue hematochezia if she strains a lot. She does note she had 3 darker, looser stools on Saturday. States stools were black, she notes that she has some RLQ pain, she proceeded to the restroom where she had the black colored stools. Had some mild RLQ pain yesterday but no further black stools. No nausea or vomiting. Is not taking any pepto bismol or iron pills.   She was having some episodes of coughing up foam/phelgm but has not had this recently.She has some episodes of feeling that food gets stuck in mid chest. She notes a lot of issues with her dentures and extra saliva production. She did not feel much improvement with omeprazole so she resumed famotidine 20mg  daily.    She is using diclofenac gel, initially doing it 3-4 times per day, but now doing 1-2x/day. She does not take ibuprofen or other NSAIDs.   Last Colonoscopy:03/09/21- Diverticulosis in the sigmoid colon. - The rest of the examined colon is normal. Biopsied-collagenous colitis .- The distal rectum and anal verge are normal on retroflexion view. Last Endoscopy:03/09/21 - No endoscopic esophageal abnormality to explain patient's dysphagia. Esophagus dilated. - 4 cm hiatal hernia. - Non-bleeding gastric ulcers with a clean ulcer base (Forrest Class III). Biopsied-no HP  - Non-bleeding duodenal diverticulum. - Normal examined duodenum.   Recommendations:  No repeat Colonoscopy    Past Medical History:  Diagnosis Date   GERD (gastroesophageal reflux disease)    Hypertension    Intertrochanteric fracture of left femur (HCC)    Osteoporosis    Scoliosis     Past Surgical History:  Procedure Laterality Date   BIOPSY  03/09/2021   Procedure: BIOPSY;  Surgeon: Dolores Frame, MD;  Location: [facility];  Service: Gastroenterology;;   CATARACT EXTRACTION     COLONOSCOPY     Per patient this was done by Dr. Teena Dunk   COLONOSCOPY WITH PROPOFOL N/A 03/09/2021   Procedure: COLONOSCOPY WITH PROPOFOL;  Surgeon: Dolores Frame, MD;  Location: [facility];  Service: Gastroenterology;  Laterality: N/A;  10:50   DILATION AND CURETTAGE OF UTERUS     ESOPHAGEAL DILATION N/A 03/09/2021   Procedure: ESOPHAGEAL DILATION;  Surgeon: Dolores Frame, MD;  Location: [facility];  Service: Gastroenterology;  Laterality: N/A;   ESOPHAGOGASTRODUODENOSCOPY (EGD) WITH PROPOFOL N/A 03/09/2021   Procedure: ESOPHAGOGASTRODUODENOSCOPY (EGD) WITH PROPOFOL;  Surgeon: Dolores Frame, MD;  Location: [facility];  Service: Gastroenterology;  Laterality: N/A;   EYE SURGERY     Bilateral cataracts   INTRAMEDULLARY (IM) NAIL INTERTROCHANTERIC Left 07/04/2019   Procedure:  INTRAMEDULLARY (IM) NAIL INTERTROCHANTRIC;  Surgeon: Samson Frederic, MD;  Location: [facility];  Service: Orthopedics;  Laterality: Left;   TONSILLECTOMY     UPPER GASTROINTESTINAL ENDOSCOPY     Done by Dr. Gabriel Cirri    Current Outpatient Medications  Medication Sig Dispense Refill   acetaminophen (TYLENOL) 500 MG tablet Take 500-1,000 mg by mouth every 6 (six) hours as needed for moderate pain.     alendronate (FOSAMAX) 70 MG tablet Take 70 mg by mouth every Tuesday.     calcium-vitamin D (OSCAL WITH D) 500-200 MG-UNIT tablet Take 1 tablet by mouth 3 (three) times daily.     colestipol (COLESTID) 1 g tablet TAKE ONE TABLET BY MOUTH EVERY MORNING, AT NOON AND AT BEDTIME 210 tablet 0   diclofenac Sodium (VOLTAREN) 1 % GEL Apply 2 g topically 4 (four) times daily.     famotidine (PEPCID) 20 MG tablet Take 20 mg by mouth daily.     folic acid (FOLVITE) 400 MCG tablet Take 400 mcg by mouth daily.     gabapentin (NEURONTIN) 300 MG capsule Take 300 mg by mouth 3 (three) times daily.     glucosamine-chondroitin 500-400 MG tablet Take 1 tablet by mouth daily.     HYDROcodone-acetaminophen (NORCO/VICODIN) 5-325 MG tablet Take 1 tablet by mouth 2 (two) times daily as needed for moderate pain.     losartan (COZAAR) 100 MG tablet Take 100 mg by mouth daily.     Multiple Vitamin (MULTIVITAMIN) tablet Take 1 tablet by mouth daily.     Omega-3 Fatty Acids (FISH OIL) 1000 MG CAPS Take 2,000 mg by mouth daily.     vitamin B-12 (CYANOCOBALAMIN) 500 MCG tablet Take 500 mcg by mouth daily.     vitamin C (ASCORBIC ACID) 500 MG tablet Take 1 tablet (500 mg total) by mouth daily.     omeprazole (PRILOSEC) 40 MG capsule Take 1 capsule (40 mg total) by mouth daily. (Patient not taking: Reported on 07/21/2023) 30 capsule 1   No current facility-administered medications for this visit.    Allergies as of 07/21/2023 - Review Complete 07/21/2023  Allergen Reaction Noted   Lidocaine Rash 07/03/2019    Family History   Problem Relation Age of Onset   Cancer Mother     Social History   Socioeconomic History   Marital status: Widowed    Spouse name: Not on file   Number of children: Not on file   Years of education: Not on file   Highest education level: Not on file  Occupational History   Not on file  Tobacco Use   Smoking status: Former    Passive exposure: Past   Smokeless tobacco: Never  Vaping Use   Vaping status: Never Used  Substance and Sexual Activity   Alcohol use: Yes    Alcohol/week: 1.0 standard drink of alcohol    Types: 1 Glasses of wine per week   Drug use: Not Currently   Sexual activity: Not Currently  Other Topics Concern   Not on file  Social History Narrative   Not on file   Social Determinants of Health   Financial  Resource Strain: Not on file  Food Insecurity: Not on file  Transportation Needs: Not on file  Physical Activity: Not on file  Stress: Not on file  Social Connections: Not on file    Review of systems General: negative for malaise, night sweats, fever, chills, weight loss Neck: Negative for lumps, goiter, pain and significant neck swelling Resp: Negative for cough, wheezing, dyspnea at rest CV: Negative for chest pain, leg swelling, palpitations, orthopnea GI: denies melena, hematochezia, nausea, vomiting, diarrhea, constipation, dysphagia, odyonophagia, early satiety or unintentional weight loss. +melena  MSK: Negative for joint pain or swelling, back pain, and muscle pain. Derm: Negative for itching or rash Psych: Denies depression, anxiety, memory loss, confusion. No homicidal or suicidal ideation.  Heme: Negative for prolonged bleeding, bruising easily, and swollen nodes. Endocrine: Negative for cold or heat intolerance, polyuria, polydipsia and goiter. Neuro: negative for tremor, gait imbalance, syncope and seizures. The remainder of the review of systems is noncontributory.  Physical Exam: BP (!) 179/84   Pulse (!) 102   Temp 97.9 F  (36.6 C) (Oral)   Ht 4\' 11"  (1.499 m)   Wt 106 lb 6.4 oz (48.3 kg)   BMI 21.49 kg/m  General:   Alert and oriented. No distress noted. Pleasant and cooperative.  Head:  Normocephalic and atraumatic. Eyes:  Conjuctiva clear without scleral icterus. Mouth:  Oral mucosa pink and moist. Good dentition. No lesions. Heart: Normal rate and rhythm, s1 and s2 heart sounds present.  Lungs: Clear lung sounds in all lobes. Respirations equal and unlabored. Abdomen:  +BS, soft, non-tender and non-distended. No rebound or guarding. No HSM or masses noted. Derm: No palmar erythema or jaundice Msk:  Symmetrical without gross deformities. Normal posture. Extremities:  Without edema. Neurologic:  Alert and  oriented x4 Psych:  Alert and cooperative. Normal mood and affect.  Invalid input(s): "6 MONTHS"   ASSESSMENT: Brenda Reynolds is a 89 y.o. female presenting today for follow up of collagenous colitis, GERD/Dysphagia and with black stools   Collagenous colitis: Doing well taking colestipol.  She notes she is only taking this a few times a week.  Having 1-2 solid stools per day.  Recommend she continue with colestipol 1 g daily for now.    Black stools: 3 black stools on Saturday with reported RLQ pain, RLQ pain has since resolved. Abdominal exam is benign. History of gastric ulcer in 2022.  She denies early satiety, nausea, vomiting though has lost 13 pounds since last year.  She is not taking any p.o. NSAIDs, however is using diclofenac gel 1-2 times daily was previously using 3-4 times daily.  There is certainly concern for upper GI bleed in setting of black stools and history of gastric ulcer.  She is not currently on a PPI.  Recommend start Protonix 40 mg daily.  Will schedule upper endoscopy once she has a ride to and from procedure.  She should avoid all NSAIDs for now.  GERD/Dysphagia: She has ongoing dysphagia, noting food sticking in mid chest.  Previously with some episodes of coughing up white  foamy substance/phlegm.  She does not feel she is having much heartburn or acid regurgitation on famotidine.  Previously tried her on omeprazole 40 mg which she did not feel provided any relief.  Last upper endoscopy was in 2022.  Further evaluation of dysphagia to be done during upper endoscopy as above.  Protonix 40 mg daily as above also may help with her GERD/dysphagia symptoms.   PLAN:  Continue  colestipol 1g daily  2. Start protonix 40mg  daily   3. Avoid using diclofenac gel 4. Schedule EGD, pt to call once she has a ride lined up  Follow Up: 4 months   Dalonda Simoni L. Jeanmarie Hubert, MSN, APRN, AGNP-C Adult-Gerontology Nurse Practitioner [facility]  I have reviewed the note and agree with the APP's assessment as described in this progress note  [name], MD Gastroenterology and Hepatology [facility]

## 2023-07-21 NOTE — Patient Instructions (Addendum)
Please start pantoprazole 40mg  daily, this is to help with acid coming up and will also help treat if you have an ulcer Give me a call once you have a ride so we can set up upper endoscopy You can continue colestipol once daily Try to avoid using diclofenac gel for now  Follow up 4 months  It was a pleasure to see you today. I want to create trusting relationships with patients and provide genuine, compassionate, and quality care. I truly value your feedback! please be on the lookout for a survey regarding your visit with me today. I appreciate your input about our visit and your time in completing this!    Ajit Errico L. Jeanmarie Hubert, MSN, APRN, AGNP-C Adult-Gerontology Nurse Practitioner [facility] at [facility]

## 2023-07-22 ENCOUNTER — Telehealth (INDEPENDENT_AMBULATORY_CARE_PROVIDER_SITE_OTHER): Payer: Self-pay | Admitting: *Deleted

## 2023-07-22 NOTE — Telephone Encounter (Signed)
Pt left vm asking how long does she take acid pill you prescribed her before she schedules the EGD. And if she needs to stop before the EGD. She has not scheduled yet.   [phone]

## 2023-07-23 NOTE — Telephone Encounter (Signed)
Discussed with patient per [facility] - Yes she can go ahead and start the protonix 40mg , take once daily in the morning 30 minutes prior toeating. She does not need to stop this prior to her EGD unless otherwise directed.

## 2023-07-23 NOTE — Telephone Encounter (Signed)
Patient verbalized understanding  

## 2023-08-12 NOTE — Progress Notes (Signed)
[facility] - Clinic Note  08/26/2023     CHIEF COMPLAINT Patient presents for Retina Follow Up   HISTORY OF PRESENT ILLNESS: Brenda Reynolds is a 89 y.o. female who presents to the clinic today for:   HPI     Retina Follow Up   Patient presents with  CRVO/BRVO.  In right eye.  Severity is moderate.  Duration of 6 weeks.  Since onset it is stable.  I, the attending physician,  performed the HPI with the patient and updated documentation appropriately.        Comments   Pt here for 6 wk ret f/u CRVO OD. Pt states [facility] about the same, can tell she is due for her shot. Pt using systane PRN OU      Last edited by Rennis Chris, MD on 08/26/2023  4:54 PM.    Pt states the vision was doing well up until this week. She states she reads things that are not there.  Referring physician: Toma Deiters, MD [address] [place],  [place] [address]  HISTORICAL INFORMATION:   Selected notes from the MEDICAL RECORD NUMBER Referred by Dr. Luciana Axe due to insurance network LEE:  Ocular Hx- PMH-    CURRENT MEDICATIONS: No current outpatient medications on file. (Ophthalmic Drugs)   No current facility-administered medications for this visit. (Ophthalmic Drugs)   Current Outpatient Medications (Other)  Medication Sig   acetaminophen (TYLENOL) 500 MG tablet Take 500-1,000 mg by mouth every 6 (six) hours as needed for moderate pain.   alendronate (FOSAMAX) 70 MG tablet Take 70 mg by mouth every Tuesday.   calcium-vitamin D (OSCAL WITH D) 500-200 MG-UNIT tablet Take 1 tablet by mouth 3 (three) times daily.   colestipol (COLESTID) 1 g tablet Take 1 tablet (1 g total) by mouth daily.   diclofenac Sodium (VOLTAREN) 1 % GEL Apply 2 g topically 4 (four) times daily.   folic acid (FOLVITE) 400 MCG tablet Take 400 mcg by mouth daily.   gabapentin (NEURONTIN) 300 MG capsule Take 300 mg by mouth 3 (three) times daily.   glucosamine-chondroitin 500-400 MG tablet Take 1 tablet  by mouth daily.   HYDROcodone-acetaminophen (NORCO/VICODIN) 5-325 MG tablet Take 1 tablet by mouth 2 (two) times daily as needed for moderate pain.   losartan (COZAAR) 100 MG tablet Take 100 mg by mouth daily.   Multiple Vitamin (MULTIVITAMIN) tablet Take 1 tablet by mouth daily.   Omega-3 Fatty Acids (FISH OIL) 1000 MG CAPS Take 2,000 mg by mouth daily.   pantoprazole (PROTONIX) 40 MG tablet Take 1 tablet (40 mg total) by mouth daily.   vitamin B-12 (CYANOCOBALAMIN) 500 MCG tablet Take 500 mcg by mouth daily.   vitamin C (ASCORBIC ACID) 500 MG tablet Take 1 tablet (500 mg total) by mouth daily.   No current facility-administered medications for this visit. (Other)   REVIEW OF SYSTEMS: ROS   Positive for: Gastrointestinal, Cardiovascular, Eyes Negative for: Constitutional, Neurological, Skin, Genitourinary, Musculoskeletal, HENT, Endocrine, Respiratory, Psychiatric, Allergic/Imm, Heme/Lymph Last edited by Thompson Grayer, COT on 08/26/2023  1:20 PM.      ALLERGIES Allergies  Allergen Reactions   Lidocaine Rash    Patch   PAST MEDICAL HISTORY Past Medical History:  Diagnosis Date   GERD (gastroesophageal reflux disease)    Hypertension    Intertrochanteric fracture of left femur (HCC)    Osteoporosis    Scoliosis    Past Surgical History:  Procedure Laterality Date  BIOPSY  03/09/2021   Procedure: BIOPSY;  Surgeon: Marguerita Merles, Reuel Boom, MD;  Location: [facility];  Service: Gastroenterology;;   CATARACT EXTRACTION     COLONOSCOPY     Per patient this was done by Dr. Teena Dunk   COLONOSCOPY WITH PROPOFOL N/A 03/09/2021   Procedure: COLONOSCOPY WITH PROPOFOL;  Surgeon: Dolores Frame, MD;  Location: [facility];  Service: Gastroenterology;  Laterality: N/A;  10:50   DILATION AND CURETTAGE OF UTERUS     ESOPHAGEAL DILATION N/A 03/09/2021   Procedure: ESOPHAGEAL DILATION;  Surgeon: Dolores Frame, MD;  Location: [facility];  Service:  Gastroenterology;  Laterality: N/A;   ESOPHAGOGASTRODUODENOSCOPY (EGD) WITH PROPOFOL N/A 03/09/2021   Procedure: ESOPHAGOGASTRODUODENOSCOPY (EGD) WITH PROPOFOL;  Surgeon: Dolores Frame, MD;  Location: [facility];  Service: Gastroenterology;  Laterality: N/A;   EYE SURGERY     Bilateral cataracts   INTRAMEDULLARY (IM) NAIL INTERTROCHANTERIC Left 07/04/2019   Procedure: INTRAMEDULLARY (IM) NAIL INTERTROCHANTRIC;  Surgeon: Samson Frederic, MD;  Location: [facility];  Service: Orthopedics;  Laterality: Left;   TONSILLECTOMY     UPPER GASTROINTESTINAL ENDOSCOPY     Done by Dr. Gabriel Cirri   FAMILY HISTORY Family History  Problem Relation Age of Onset   Cancer Mother    SOCIAL HISTORY Social History   Tobacco Use   Smoking status: Former    Passive exposure: Past   Smokeless tobacco: Never  Vaping Use   Vaping status: Never Used  Substance Use Topics   Alcohol use: Yes    Alcohol/week: 1.0 standard drink of alcohol    Types: 1 Glasses of wine per week   Drug use: Not Currently       OPHTHALMIC EXAM:  Base Eye Exam     Visual Acuity (Snellen - Linear)       Right Left   Dist cc 20/60 -2 20/30 -1   Dist ph cc 20/50 -1 NI    Correction: Glasses         Tonometry (Tonopen, 1:27 PM)       Right Left   Pressure 13 14         Pupils       Pupils Dark Light Shape React APD   Right PERRL 2 2 Round NR None   Left PERRL 2 2 Round NR None         Visual Fields       Left Right    Full Full         Extraocular Movement       Right Left    Full, Ortho Full, Ortho         Neuro/Psych     Oriented x3: Yes   Mood/Affect: Normal         Dilation     Both eyes: 1.0% Mydriacyl, 2.5% Phenylephrine @ 1:28 PM           Slit Lamp and Fundus Exam     External Exam       Right Left   External Normal Normal         Slit Lamp Exam       Right Left   Lids/Lashes Dermatochalasis - upper lid, Meibomian gland dysfunction Dermatochalasis -  upper lid, Meibomian gland dysfunction   Conjunctiva/Sclera White and quiet White and quiet   Cornea arcus, 2+ Punctate epithelial erosions, Descemet's folds, tear film debris arcus, 1+ Punctate epithelial erosions, trace Descemet's folds, tear film debris   Anterior Chamber  deep and clear deep and clear   Iris Round and dilated Round and dilated   Lens 3 piece PC IOL in good position with open PC 3 piece PC IOL in good position with open PC   Anterior Vitreous Vitreous syneresis, vitreous condensations, Posterior vitreous detachment Posterior vitreous detachment, vitreous condensations         Fundus Exam       Right Left   Disc Pink and Sharp, +hyperemia, vascular loops mild Pallor, Sharp rim, central hyperemia, mild PPA   C/D Ratio 0.3 0.2   Macula hazy view, Blunted foveal reflex, +edema and scattered IRH -- improved, +drusen Flat, Good foveal reflex, RPE mottling and clumping, Drusen, No heme or edema   Vessels attenuated, Tortuous, old CRVO attenuated, Tortuous   Periphery Attached, mild reticular degeneration Attached, mild reticular degeneration, No heme           Refraction     Wearing Rx       Sphere Cylinder Axis Add   Right -0.75 +2.00 178 +2.50   Left -0.50 +2.00 170 +2.50           IMAGING AND PROCEDURES  Imaging and Procedures for 08/26/2023  OCT, Retina - OU - Both Eyes       Right Eye Quality was borderline. Scan locations included subfoveal. Central Foveal Thickness: 202. Progression has improved. Findings include abnormal foveal contour, intraretinal fluid, subretinal fluid, outer retinal atrophy (Trace cystic changes temporal fovea and macula -- improved, SRF and foveal contour stably improved).   Left Eye Quality was good. Scan locations included subfoveal. Central Foveal Thickness: 259. Progression has been stable. Findings include normal foveal contour, no IRF, no SRF, retinal drusen .   Notes *Images captured and stored on drive  Diagnosis  / Impression:  OD: shallow cystic changes temporal fovea and macula -- improved, SRF and foveal contour stably improved OS: NFP, no IRF/SRF  Clinical management:  See below  Abbreviations: NFP - Normal foveal profile. CME - cystoid macular edema. PED - pigment epithelial detachment. IRF - intraretinal fluid. SRF - subretinal fluid. EZ - ellipsoid zone. ERM - epiretinal membrane. ORA - outer retinal atrophy. ORT - outer retinal tubulation. SRHM - subretinal hyper-reflective material. IRHM - intraretinal hyper-reflective material      Intravitreal Injection, Pharmacologic Agent - OD - Right Eye       Time Out 08/26/2023. 2:15 PM. Confirmed correct patient, procedure, site, and patient consented.   Anesthesia Topical anesthesia was used. Anesthetic medications included Lidocaine 2%, Proparacaine 0.5%.   Procedure Preparation included 5% betadine to ocular surface, eyelid speculum. A (32g) needle was used.   Injection: 2 mg aflibercept 2 MG/0.05ML   Route: Intravitreal, Site: Right Eye   NDC: [id number], Lot: [id number], Expiration date: 12/28/2024, Waste: 0 mL   Post-op Post injection exam found visual acuity of at least counting fingers. The patient tolerated the procedure well. There were no complications. The patient received written and verbal post procedure care education.            ASSESSMENT/PLAN:    ICD-10-CM   1. Central retinal vein occlusion with macular edema of right eye  H34.8110 OCT, Retina - OU - Both Eyes    Intravitreal Injection, Pharmacologic Agent - OD - Right Eye    aflibercept (EYLEA) SOLN 2 mg    2. Intermediate stage nonexudative age-related macular degeneration of both eyes  H35.3132     3. Essential hypertension  I10     4. Hypertensive  retinopathy of both eyes  H35.033     5. Pseudophakia of both eyes  Z96.1     6. Dry eyes  H04.123       CRVO w/ CME, OD  - previous Dr. Luciana Axe pt here for insurance reasons  - history of IVA OD x13  (last one 09.14.23 -- 14 wks) - s/p IVA OD #14 (12.19.23), #15 (01.30.24), #16 (03.12.24), #17 (04.23.24) -- IVA resistance - s/p IVE OD #1 (06.04.24), #2 (07.16.24), #3 (08.28.24), #4 (10.15.24) - BCVA 20/50 -- improved - OCT shows OD: shallow cystic changes temporal fovea and macula -- improved, SRF and foveal contour stably improved; OS: NFP, no IRF/SRF at 6 weeks  - recommend IVE OD #5 today 11.26.24, with follow up in 6 weeks  - RBA of procedure discussed, questions answered - IVE informed consent obtained and signed 06.04.24  - see procedure note - Eylea approved for 2024 - f/u 6 wks -- DFE/OCT, possible injection  2. Age related macular degeneration, non-exudative, OU  - mild-intermediate stage - The incidence, anatomy, and pathology of dry AMD, risk of progression, and the AREDS and AREDS 2 studies including smoking risks discussed with patient.  - Recommend amsler grid monitoring  3,4. Hypertensive retinopathy OU - discussed importance of tight BP control - continue to monitor  5. Pseudophakia OU  - s/p CE/IOL OU  - IOLs in good position, doing well  - continue to monitor  6. Dry eyes OU - recommend artificial tears and lubricating ointment as needed  Ophthalmic Meds Ordered this visit:  Meds ordered this encounter  Medications   aflibercept (EYLEA) SOLN 2 mg     Return in about 6 weeks (around 10/07/2023) for f/u CRVO OD, DFE, OCT, Possible, IVE, OD.  There are no Patient Instructions on file for this visit.   Explained the diagnoses, plan, and follow up with the patient and they expressed understanding.  Patient expressed understanding of the importance of proper follow up care.   This document serves as a record of services personally performed by Karie Chimera, MD, PhD. It was created on their behalf by Charlette Caffey, COT an ophthalmic technician. The creation of this record is the provider's dictation and/or activities during the visit.     Electronically signed by:  Charlette Caffey, COT  08/26/23 4:55 PM  This document serves as a record of services personally performed by Karie Chimera, MD, PhD. It was created on their behalf by Glee Arvin. Manson Passey, OA an ophthalmic technician. The creation of this record is the provider's dictation and/or activities during the visit.    Electronically signed by: Glee Arvin. Manson Passey, OA 08/26/23 4:55 PM  Karie Chimera, M.D., Ph.D. Diseases & Surgery of the Retina and Vitreous [facility]  I have reviewed the above documentation for accuracy and completeness, and I agree with the above. Karie Chimera, M.D., Ph.D. 08/26/23 4:56 PM   Abbreviations: M myopia (nearsighted); A astigmatism; H hyperopia (farsighted); P presbyopia; Mrx spectacle prescription;  CTL contact lenses; OD right eye; OS left eye; OU both eyes  XT exotropia; ET esotropia; PEK punctate epithelial keratitis; PEE punctate epithelial erosions; DES dry eye syndrome; MGD meibomian gland dysfunction; ATs artificial tears; PFAT's preservative free artificial tears; NSC nuclear sclerotic cataract; PSC posterior subcapsular cataract; ERM epi-retinal membrane; PVD posterior vitreous detachment; RD retinal detachment; DM diabetes mellitus; DR diabetic retinopathy; NPDR non-proliferative diabetic retinopathy; PDR proliferative diabetic retinopathy; CSME clinically significant macular edema;  DME diabetic macular edema; dbh dot blot hemorrhages; CWS cotton wool spot; POAG primary open angle glaucoma; C/D cup-to-disc ratio; HVF humphrey visual field; GVF goldmann visual field; OCT optical coherence tomography; IOP intraocular pressure; BRVO Branch retinal vein occlusion; CRVO central retinal vein occlusion; CRAO central retinal artery occlusion; BRAO branch retinal artery occlusion; RT retinal tear; SB scleral buckle; PPV pars plana vitrectomy; VH Vitreous hemorrhage; PRP panretinal laser photocoagulation; IVK intravitreal  kenalog; VMT vitreomacular traction; MH Macular hole;  NVD neovascularization of the disc; NVE neovascularization elsewhere; AREDS age related eye disease study; ARMD age related macular degeneration; POAG primary open angle glaucoma; EBMD epithelial/anterior basement membrane dystrophy; ACIOL anterior chamber intraocular lens; IOL intraocular lens; PCIOL posterior chamber intraocular lens; Phaco/IOL phacoemulsification with intraocular lens placement; PRK photorefractive keratectomy; LASIK laser assisted in situ keratomileusis; HTN hypertension; DM diabetes mellitus; COPD chronic obstructive pulmonary disease

## 2023-08-26 ENCOUNTER — Encounter (INDEPENDENT_AMBULATORY_CARE_PROVIDER_SITE_OTHER): Payer: Self-pay | Admitting: Ophthalmology

## 2023-08-26 ENCOUNTER — Ambulatory Visit (INDEPENDENT_AMBULATORY_CARE_PROVIDER_SITE_OTHER): Payer: Medicare Other | Admitting: Ophthalmology

## 2023-08-26 DIAGNOSIS — H34811 Central retinal vein occlusion, right eye, with macular edema: Secondary | ICD-10-CM | POA: Diagnosis not present

## 2023-08-26 DIAGNOSIS — Z961 Presence of intraocular lens: Secondary | ICD-10-CM

## 2023-08-26 DIAGNOSIS — H353132 Nonexudative age-related macular degeneration, bilateral, intermediate dry stage: Secondary | ICD-10-CM | POA: Diagnosis not present

## 2023-08-26 DIAGNOSIS — H04123 Dry eye syndrome of bilateral lacrimal glands: Secondary | ICD-10-CM

## 2023-08-26 DIAGNOSIS — H35033 Hypertensive retinopathy, bilateral: Secondary | ICD-10-CM | POA: Diagnosis not present

## 2023-08-26 DIAGNOSIS — I10 Essential (primary) hypertension: Secondary | ICD-10-CM

## 2023-08-26 MED ORDER — AFLIBERCEPT 2MG/0.05ML IZ SOLN FOR KALEIDOSCOPE
2.0000 mg | INTRAVITREAL | Status: AC | PRN
  Administered 2023-08-26: 2 mg via INTRAVITREAL

## 2023-09-02 ENCOUNTER — Telehealth (INDEPENDENT_AMBULATORY_CARE_PROVIDER_SITE_OTHER): Payer: Self-pay | Admitting: Gastroenterology

## 2023-09-02 NOTE — Telephone Encounter (Signed)
Pt contacted. Pt advised that Dr.Castaneda does not have any availability until I get January schedule. Pt is ok with scheduling in January.

## 2023-09-02 NOTE — Telephone Encounter (Signed)
Pt left message to schedule EGD. I do not have encounter form that I can find. Please advise ASA and provider. Thank you!!

## 2023-09-09 DIAGNOSIS — J449 Chronic obstructive pulmonary disease, unspecified: Secondary | ICD-10-CM | POA: Diagnosis not present

## 2023-09-09 DIAGNOSIS — N1831 Chronic kidney disease, stage 3a: Secondary | ICD-10-CM | POA: Diagnosis not present

## 2023-09-09 DIAGNOSIS — I10 Essential (primary) hypertension: Secondary | ICD-10-CM | POA: Diagnosis not present

## 2023-09-09 DIAGNOSIS — M818 Other osteoporosis without current pathological fracture: Secondary | ICD-10-CM | POA: Diagnosis not present

## 2023-09-10 NOTE — Telephone Encounter (Signed)
Pt contacted and scheduled for 10/14/23. No pa needed per insurance. Will mail instructions once pre op has been received.

## 2023-10-06 NOTE — Patient Instructions (Signed)
 Brenda Reynolds  10/06/2023     @PREFPERIOPPHARMACY @   Your procedure is scheduled on 10/14/2023.   [facility] to [facility] at  1130  A.M.   Call this number if you have problems the morning of surgery:  [phone]  If you experience any cold or flu symptoms such as cough, fever, chills, shortness of breath, etc. between now and your scheduled surgery, please notify us  at the above number.   Remember:  Follow the diet instructions given to you by the office.    You may drink clear liquids until 0930 am on 10/14/2023.    Clear liquids allowed are:                    Water, Juice (No red color; non-citric and without pulp; diabetics please choose diet or no sugar options), Carbonated beverages (diabetics please choose diet or no sugar options), Clear Tea (No creamer, milk, or cream, including half & half and powdered creamer), Black Coffee Only (No creamer, milk or cream, including half & half and powdered creamer), and Clear Sports drink (No red color; diabetics please choose diet or no sugar options)    Take these medicines the morning of surgery with A SIP OF WATER               gabapentin , hydrocodone (if needed), pantoprazole .     Do not wear jewelry, make-up or nail polish, including gel polish,  artificial nails, or any other type of covering on natural nails (fingers and  toes).  Do not wear lotions, powders, or perfumes, or deodorant.  Do not shave 48 hours prior to surgery.  Men may shave face and neck.  Do not bring valuables to the hospital.  [facility] is not responsible for any belongings or valuables.  Contacts, dentures or bridgework may not be worn into surgery.  Leave your suitcase in the car.  After surgery it may be brought to your room.  For patients admitted to the hospital, discharge time will be determined by your treatment team.  Patients discharged the day of surgery will not be allowed to drive home and must have someone with them for 24 hours.     Special instructions:   DO NOT smoke tobacco or vape for 24 hours before your procedure.  Please read over the following fact sheets that you were given. Anesthesia Post-op Instructions and Care and Recovery After Surgery      Upper Endoscopy, Adult, Care After After the procedure, it is common to have a sore throat. It is also common to have: Mild stomach pain or discomfort. Bloating. Nausea. Follow these instructions at home: The instructions below may help you care for yourself at home. Your health care provider may give you more instructions. If you have questions, ask your health care provider. If you were given a sedative during the procedure, it can affect you for several hours. Do not drive or operate machinery until your health care provider says that it is safe. If you will be going home right after the procedure, plan to have a responsible adult: Take you home from the hospital or clinic. You will not be allowed to drive. Care for you for the time you are told. Follow instructions from your health care provider about what you may eat and drink. Return to your normal activities as told by your health care provider. Ask your health care provider what activities are safe for you. Take  over-the-counter and prescription medicines only as told by your health care provider. Contact a health care provider if you: Have a sore throat that lasts longer than one day. Have trouble swallowing. Have a fever. Get help right away if you: Vomit blood or your vomit looks like coffee grounds. Have bloody, black, or tarry stools. Have a very bad sore throat or you cannot swallow. Have difficulty breathing or very bad pain in your chest or abdomen. These symptoms may be an emergency. Get help right away. Call 911. Do not wait to see if the symptoms will go away. Do not drive yourself to the hospital. Summary After the procedure, it is common to have a sore throat, mild stomach  discomfort, bloating, and nausea. If you were given a sedative during the procedure, it can affect you for several hours. Do not drive until your health care provider says that it is safe. Follow instructions from your health care provider about what you may eat and drink. Return to your normal activities as told by your health care provider. This information is not intended to replace advice given to you by your health care provider. Make sure you discuss any questions you have with your health care provider. Document Revised: 12/26/2021 Document Reviewed: 12/26/2021 Elsevier Patient Education  2024 [employer] Monitored Anesthesia Care, Care After The following information offers guidance on how to care for yourself after your procedure. Your health care provider may also give you more specific instructions. If you have problems or questions, contact your health care provider. What can I expect after the procedure? After the procedure, it is common to have: Tiredness. Little or no memory about what happened during or after the procedure. Impaired judgment when it comes to making decisions. Nausea or vomiting. Some trouble with balance. Follow these instructions at home: For the time period you were told by your health care provider:  Rest. Do not participate in activities where you could fall or become injured. Do not drive or use machinery. Do not drink alcohol. Do not take sleeping pills or medicines that cause drowsiness. Do not make important decisions or sign legal documents. Do not take care of children on your own. Medicines Take over-the-counter and prescription medicines only as told by your health care provider. If you were prescribed antibiotics, take them as told by your health care provider. Do not stop using the antibiotic even if you start to feel better. Eating and drinking Follow instructions from your health care provider about what you may eat and drink. Drink  enough fluid to keep your urine pale yellow. If you vomit: Drink clear fluids slowly and in small amounts as you are able. Clear fluids include water, ice chips, low-calorie sports drinks, and fruit juice that has water added to it (diluted fruit juice). Eat light and bland foods in small amounts as you are able. These foods include bananas, applesauce, rice, lean meats, toast, and crackers. General instructions  Have a responsible adult stay with you for the time you are told. It is important to have someone help care for you until you are awake and alert. If you have sleep apnea, surgery and some medicines can increase your risk for breathing problems. Follow instructions from your health care provider about wearing your sleep device: When you are sleeping. This includes during daytime naps. While taking prescription pain medicines, sleeping medicines, or medicines that make you drowsy. Do not use any products that contain nicotine or tobacco. These products include cigarettes,  chewing tobacco, and vaping devices, such as e-cigarettes. If you need help quitting, ask your health care provider. Contact a health care provider if: You feel nauseous or vomit every time you eat or drink. You feel light-headed. You are still sleepy or having trouble with balance after 24 hours. You get a rash. You have a fever. You have redness or swelling around the IV site. Get help right away if: You have trouble breathing. You have new confusion after you get home. These symptoms may be an emergency. Get help right away. Call 911. Do not wait to see if the symptoms will go away. Do not drive yourself to the hospital. This information is not intended to replace advice given to you by your health care provider. Make sure you discuss any questions you have with your health care provider. Document Revised: 02/11/2022 Document Reviewed: 02/11/2022 Elsevier Patient Education  2024 [employer].

## 2023-10-07 ENCOUNTER — Encounter (HOSPITAL_COMMUNITY)
Admission: RE | Admit: 2023-10-07 | Discharge: 2023-10-07 | Disposition: A | Discharge status: Home / Self Care | Payer: Medicare Other | Source: Ambulatory Visit | Attending: Gastroenterology | Admitting: Gastroenterology

## 2023-10-07 ENCOUNTER — Encounter (HOSPITAL_COMMUNITY): Payer: Self-pay

## 2023-10-07 VITALS — BP 140/80 | HR 88 | Temp 97.7°F | Resp 18 | Ht 59.0 in | Wt 106.5 lb

## 2023-10-07 DIAGNOSIS — I491 Atrial premature depolarization: Secondary | ICD-10-CM | POA: Insufficient documentation

## 2023-10-07 DIAGNOSIS — E871 Hypo-osmolality and hyponatremia: Secondary | ICD-10-CM | POA: Insufficient documentation

## 2023-10-07 DIAGNOSIS — Z01818 Encounter for other preprocedural examination: Secondary | ICD-10-CM | POA: Insufficient documentation

## 2023-10-07 DIAGNOSIS — I493 Ventricular premature depolarization: Secondary | ICD-10-CM | POA: Diagnosis not present

## 2023-10-07 DIAGNOSIS — I10 Essential (primary) hypertension: Secondary | ICD-10-CM | POA: Insufficient documentation

## 2023-10-07 LAB — BASIC METABOLIC PANEL
Anion gap: 8 (ref 5–15)
BUN: 13 mg/dL (ref 8–23)
CO2: 27 mmol/L (ref 22–32)
Calcium: 9.8 mg/dL (ref 8.9–10.3)
Chloride: 103 mmol/L (ref 98–111)
Creatinine, Ser: 0.75 mg/dL (ref 0.44–1.00)
GFR, Estimated: >60 mL/min (ref >60)
Glucose, Bld: 83 mg/dL (ref 70–99)
Potassium: 4.2 mmol/L (ref 3.5–5.1)
Sodium: 138 mmol/L (ref 135–145)

## 2023-10-10 ENCOUNTER — Encounter (INDEPENDENT_AMBULATORY_CARE_PROVIDER_SITE_OTHER): Payer: Medicare Other | Admitting: Ophthalmology

## 2023-10-14 ENCOUNTER — Other Ambulatory Visit: Payer: Self-pay

## 2023-10-14 ENCOUNTER — Encounter (HOSPITAL_COMMUNITY)
Admission: RE | Disposition: A | Discharge status: Home / Self Care | Payer: Self-pay | Source: Home / Self Care | Attending: Gastroenterology

## 2023-10-14 ENCOUNTER — Ambulatory Visit (HOSPITAL_COMMUNITY)
Admission: RE | Admit: 2023-10-14 | Discharge: 2023-10-14 | Disposition: A | Discharge status: Home / Self Care | Payer: Medicare Other | Attending: Gastroenterology | Admitting: Gastroenterology

## 2023-10-14 ENCOUNTER — Encounter (HOSPITAL_COMMUNITY): Payer: Self-pay | Admitting: Gastroenterology

## 2023-10-14 ENCOUNTER — Ambulatory Visit (HOSPITAL_BASED_OUTPATIENT_CLINIC_OR_DEPARTMENT_OTHER): Payer: Medicare Other | Admitting: Anesthesiology

## 2023-10-14 ENCOUNTER — Ambulatory Visit (HOSPITAL_COMMUNITY): Payer: Medicare Other | Admitting: Anesthesiology

## 2023-10-14 DIAGNOSIS — Z79899 Other long term (current) drug therapy: Secondary | ICD-10-CM | POA: Diagnosis not present

## 2023-10-14 DIAGNOSIS — Q399 Congenital malformation of esophagus, unspecified: Secondary | ICD-10-CM

## 2023-10-14 DIAGNOSIS — R1310 Dysphagia, unspecified: Secondary | ICD-10-CM | POA: Insufficient documentation

## 2023-10-14 DIAGNOSIS — K449 Diaphragmatic hernia without obstruction or gangrene: Secondary | ICD-10-CM

## 2023-10-14 DIAGNOSIS — M810 Age-related osteoporosis without current pathological fracture: Secondary | ICD-10-CM | POA: Insufficient documentation

## 2023-10-14 DIAGNOSIS — K52831 Collagenous colitis: Secondary | ICD-10-CM | POA: Diagnosis not present

## 2023-10-14 DIAGNOSIS — K5710 Diverticulosis of small intestine without perforation or abscess without bleeding: Secondary | ICD-10-CM

## 2023-10-14 DIAGNOSIS — Z87891 Personal history of nicotine dependence: Secondary | ICD-10-CM | POA: Insufficient documentation

## 2023-10-14 DIAGNOSIS — K219 Gastro-esophageal reflux disease without esophagitis: Secondary | ICD-10-CM | POA: Insufficient documentation

## 2023-10-14 DIAGNOSIS — I10 Essential (primary) hypertension: Secondary | ICD-10-CM | POA: Diagnosis not present

## 2023-10-14 HISTORY — PX: ESOPHAGEAL DILATION: SHX303

## 2023-10-14 HISTORY — PX: ESOPHAGOGASTRODUODENOSCOPY (EGD) WITH PROPOFOL: SHX5813

## 2023-10-14 SURGERY — ESOPHAGOGASTRODUODENOSCOPY (EGD) WITH PROPOFOL
Anesthesia: General

## 2023-10-14 MED ORDER — SODIUM CHLORIDE 0.9% FLUSH: 3.0000 mL | INTRAVENOUS | Status: DC | PRN

## 2023-10-14 MED ORDER — LIDOCAINE HCL (PF) 2 % IJ SOLN
INTRAMUSCULAR | Status: DC | PRN
  Administered 2023-10-14: 50 mg via INTRADERMAL

## 2023-10-14 MED ORDER — LACTATED RINGERS IV SOLN: INTRAVENOUS | Status: DC | PRN

## 2023-10-14 MED ORDER — PHENYLEPHRINE 80 MCG/ML (10ML) SYRINGE FOR IV PUSH (FOR BLOOD PRESSURE SUPPORT)
PREFILLED_SYRINGE | INTRAVENOUS | Status: DC | PRN
  Administered 2023-10-14: 80 ug via INTRAVENOUS

## 2023-10-14 MED ORDER — SODIUM CHLORIDE 0.9% FLUSH: 3.0000 mL | Freq: Two times a day (BID) | INTRAVENOUS | Status: DC

## 2023-10-14 MED ORDER — PROPOFOL 500 MG/50ML IV EMUL
INTRAVENOUS | Status: DC | PRN
  Administered 2023-10-14: 70 ug/kg/min via INTRAVENOUS

## 2023-10-14 MED ORDER — PROPOFOL 10 MG/ML IV BOLUS
INTRAVENOUS | Status: DC | PRN
  Administered 2023-10-14: 80 mg via INTRAVENOUS

## 2023-10-14 MED ORDER — PHENYLEPHRINE 80 MCG/ML (10ML) SYRINGE FOR IV PUSH (FOR BLOOD PRESSURE SUPPORT)
PREFILLED_SYRINGE | INTRAVENOUS | Status: AC
  Filled 2023-10-14: qty 10

## 2023-10-14 MED ORDER — FAMOTIDINE 40 MG PO TABS: 40.0000 mg | ORAL_TABLET | Freq: Every evening | ORAL | 1 refills | Status: AC

## 2023-10-14 NOTE — H&P (Signed)
 Brenda Reynolds is an 89 y.o. female.   Chief Complaint: dysphagia/choking and regurgitation.  HPI: Brenda Reynolds is a 89 y.o. female with past medical history of GERD, HTN, collagenous colitis and osteoporosis, coming for evaluation of dysphagia/choking and regurgitation.  States she is having intermittent episodes of choking when she swallows food, which she describes as coughing with food intake.  Has not presented any heartburn or odynophagia.  Also reports some intermittent episodes of regurgitation, mostly sour liquids in the morning.  Past Medical History:  Diagnosis Date   GERD (gastroesophageal reflux disease)    Hypertension    Intertrochanteric fracture of left femur (HCC)    Osteoporosis    Scoliosis     Past Surgical History:  Procedure Laterality Date   BIOPSY  03/09/2021   Procedure: BIOPSY;  Surgeon: Eartha Angelia Sieving, MD;  Location: [facility];  Service: Gastroenterology;;   CATARACT EXTRACTION     COLONOSCOPY     Per patient this was done by Dr. Donnel   COLONOSCOPY WITH PROPOFOL  N/A 03/09/2021   Procedure: COLONOSCOPY WITH PROPOFOL ;  Surgeon: Eartha Angelia Sieving, MD;  Location: [facility];  Service: Gastroenterology;  Laterality: N/A;  10:50   DILATION AND CURETTAGE OF UTERUS     ESOPHAGEAL DILATION N/A 03/09/2021   Procedure: ESOPHAGEAL DILATION;  Surgeon: Eartha Angelia Sieving, MD;  Location: [facility];  Service: Gastroenterology;  Laterality: N/A;   ESOPHAGOGASTRODUODENOSCOPY (EGD) WITH PROPOFOL  N/A 03/09/2021   Procedure: ESOPHAGOGASTRODUODENOSCOPY (EGD) WITH PROPOFOL ;  Surgeon: Eartha Angelia Sieving, MD;  Location: [facility];  Service: Gastroenterology;  Laterality: N/A;   EYE SURGERY     Bilateral cataracts   INTRAMEDULLARY (IM) NAIL INTERTROCHANTERIC Left 07/04/2019   Procedure: INTRAMEDULLARY (IM) NAIL INTERTROCHANTRIC;  Surgeon: Fidel Rogue, MD;  Location: [facility];  Service: Orthopedics;  Laterality: Left;   TONSILLECTOMY      UPPER GASTROINTESTINAL ENDOSCOPY     Done by Dr. Ivery    Family History  Problem Relation Age of Onset   Cancer Mother    Social History:  reports that she has quit smoking. She has been exposed to tobacco smoke. She has never used smokeless tobacco. She reports current alcohol use of about 1.0 standard drink of alcohol per week. She reports that she does not currently use drugs.  Allergies:  Allergies  Allergen Reactions   Lidocaine  Rash    Patch    Medications Prior to Admission  Medication Sig Dispense Refill   acetaminophen  (TYLENOL ) 500 MG tablet Take 500-1,000 mg by mouth every 6 (six) hours as needed for moderate pain.     alendronate (FOSAMAX) 70 MG tablet Take 70 mg by mouth every Tuesday.     calcium-vitamin D  (OSCAL WITH D) 500-200 MG-UNIT tablet Take 1 tablet by mouth 3 (three) times daily.     colestipol  (COLESTID ) 1 g tablet Take 1 tablet (1 g total) by mouth daily. 90 tablet 3   diclofenac Sodium (VOLTAREN) 1 % GEL Apply 2 g topically 4 (four) times daily.     folic acid  (FOLVITE ) 400 MCG tablet Take 400 mcg by mouth daily.     gabapentin  (NEURONTIN ) 300 MG capsule Take 300 mg by mouth 3 (three) times daily.     glucosamine-chondroitin 500-400 MG tablet Take 1 tablet by mouth daily.     HYDROcodone -acetaminophen  (NORCO/VICODIN) 5-325 MG tablet Take 1 tablet by mouth 2 (two) times daily as needed for moderate pain.     losartan  (COZAAR ) 100 MG tablet  Take 100 mg by mouth daily.     Multiple Vitamin (MULTIVITAMIN) tablet Take 1 tablet by mouth daily.     Omega-3 Fatty Acids (FISH OIL) 1000 MG CAPS Take 2,000 mg by mouth daily.     pantoprazole  (PROTONIX ) 40 MG tablet Take 1 tablet (40 mg total) by mouth daily. 90 tablet 1   vitamin B-12 (CYANOCOBALAMIN ) 500 MCG tablet Take 500 mcg by mouth daily.     vitamin C (ASCORBIC ACID) 500 MG tablet Take 1 tablet (500 mg total) by mouth daily.      No results found for this or any previous visit (from the past 48  hours). No results found.  Review of Systems  HENT:  Positive for trouble swallowing.   All other systems reviewed and are negative.   Blood pressure (!) 183/89, pulse 90, temperature 98 F (36.7 C), temperature source Oral, resp. rate (!) 21, SpO2 94%. Physical Exam  GENERAL: The patient is AO x3, in no acute distress. HEENT: Head is normocephalic and atraumatic. EOMI are intact. Mouth is well hydrated and without lesions. NECK: Supple. No masses LUNGS: Clear to auscultation. No presence of rhonchi/wheezing/rales. Adequate chest expansion HEART: RRR, normal s1 and s2. ABDOMEN: Soft, nontender, no guarding, no peritoneal signs, and nondistended. BS +. No masses. EXTREMITIES: Without any cyanosis, clubbing, rash, lesions or edema. NEUROLOGIC: AOx3, no focal motor deficit. SKIN: no jaundice, no rashes  Assessment/Plan Brenda Reynolds is a 89 y.o. female with past medical history of GERD, HTN, collagenous colitis and osteoporosis, coming for evaluation of dysphagia/choking and regurgitation.  We will proceed with EGD.  Toribio Eartha Flavors, MD 10/14/2023, 1:58 PM

## 2023-10-14 NOTE — Op Note (Signed)
 [facility] Patient Name: Brenda Reynolds Procedure Date: 10/14/2023 10:21 AM MRN: [id number] Date of Birth: 02-25-1930 Attending MD: Toribio Fortune , , [id number] CSN: [id number] Age: 89 Admit Type: Inpatient Procedure:                Upper GI endoscopy Indications:              Dysphagia Providers:                Toribio Fortune, Tammy Vaught, RN, Crystal Page,                            Mickel CROME. Shirlean Balm, Technician Referring MD:              Medicines:                Monitored Anesthesia Care Complications:            No immediate complications. Estimated Blood Loss:     Estimated blood loss: none. Procedure:                Pre-Anesthesia Assessment:                           - Prior to the procedure, a History and Physical                            was performed, and patient medications, allergies                            and sensitivities were reviewed. The patient's                            tolerance of previous anesthesia was reviewed.                           - The risks and benefits of the procedure and the                            sedation options and risks were discussed with the                            patient. All questions were answered and informed                            consent was obtained.                           - ASA Grade Assessment: III - A patient with severe                            systemic disease.                           After obtaining informed consent, the endoscope was                            passed under direct vision. Throughout the  procedure, the patient's blood pressure, pulse, and                            oxygen saturations were monitored continuously. The                            GIF-H190 ([id number]) scope was introduced through the                            mouth, and advanced to the second part of duodenum.                            The upper GI endoscopy was accomplished without                             difficulty. The patient tolerated the procedure                            well. Scope In: 2:06:36 PM Scope Out: 2:12:46 PM Total Procedure Duration: 0 hours 6 minutes 10 seconds  Findings:      The lower third of the esophagus was tortuous. A TTS dilator was passed       through the scope. Dilation with a 15-16.5-18 mm balloon dilator was       performed to 18 mm. The dilation site was examined and showed no change.      A 6 cm hiatal hernia was present.      The stomach was normal.      A medium non-bleeding diverticulum was found in the second portion of       the duodenum. Impression:               - Tortuous esophagus. Dilated.                           - 6 cm hiatal hernia.                           - Normal stomach.                           - Non-bleeding duodenal diverticulum.                           - No specimens collected. Moderate Sedation:      Per Anesthesia Care Recommendation:           - Discharge patient to home (ambulatory).                           - Resume previous diet.                           - Continue present medications.                           - If persistent dysphagia, will proceed with  esophagogram.                           - Use Pepcid  (famotidine ) 40 mg PO daily. Procedure Code(s):        --- Professional ---                           [phone], Esophagogastroduodenoscopy, flexible,                            transoral; with transendoscopic balloon dilation of                            esophagus (less than 30 mm diameter) Diagnosis Code(s):        --- Professional ---                           Q39.9, Congenital malformation of esophagus,                            unspecified                           K44.9, Diaphragmatic hernia without obstruction or                            gangrene                           R13.10, Dysphagia, unspecified                           K57.10, Diverticulosis  of small intestine without                            perforation or abscess without bleeding CPT copyright 2022 [employer]. All rights reserved. The codes documented in this report are preliminary and upon coder review may  be revised to meet current compliance requirements. Toribio Fortune, MD Toribio Fortune,  10/14/2023 2:19:56 PM This report has been signed electronically. Number of Addenda: 0

## 2023-10-14 NOTE — Anesthesia Preprocedure Evaluation (Addendum)
 Anesthesia Evaluation  Patient identified by MRN, date of birth, ID band Patient awake    Reviewed: Allergy & Precautions, NPO status , Patient's Chart, lab work & pertinent test results  History of Anesthesia Complications Negative for: history of anesthetic complications  Airway Mallampati: II  TM Distance: >3 FB Neck ROM: Full    Dental  (+) Edentulous Upper, Edentulous Lower   Pulmonary former smoker   Pulmonary exam normal breath sounds clear to auscultation       Cardiovascular Exercise Tolerance: Good hypertension, Pt. on medications Normal cardiovascular exam Rhythm:Regular Rate:Normal     Neuro/Psych negative neurological ROS  negative psych ROS   GI/Hepatic Neg liver ROS,GERD  Medicated,,  Endo/Other  negative endocrine ROS    Renal/GU negative Renal ROS     Musculoskeletal negative musculoskeletal ROS (+)    Abdominal Normal abdominal exam  (+)   Peds  Hematology negative hematology ROS (+)   Anesthesia Other Findings   Reproductive/Obstetrics negative OB ROS                             Anesthesia Physical Anesthesia Plan  ASA: 2  Anesthesia Plan: General   Post-op Pain Management: Minimal or no pain anticipated   Induction: Intravenous  PONV Risk Score and Plan: Propofol  infusion  Airway Management Planned: Nasal Cannula and Natural Airway  Additional Equipment: None  Intra-op Plan:   Post-operative Plan:   Informed Consent: I have reviewed the patients History and Physical, chart, labs and discussed the procedure including the risks, benefits and alternatives for the proposed anesthesia with the patient or authorized representative who has indicated his/her understanding and acceptance.     Dental advisory given  Plan Discussed with: CRNA  Anesthesia Plan Comments:         Anesthesia Quick Evaluation

## 2023-10-14 NOTE — Discharge Instructions (Signed)
 You are being discharged to home.  Resume your previous diet.  Continue your present medications.  Take Pepcid (famotidine) 40 mg by mouth once a day.

## 2023-10-14 NOTE — Transfer of Care (Signed)
 Immediate Anesthesia Transfer of Care Note  Patient: Brenda Reynolds  Procedure(s) Performed: ESOPHAGOGASTRODUODENOSCOPY (EGD) WITH PROPOFOL  ESOPHAGEAL DILATION  Patient Location: Short Stay  Anesthesia Type:General  Level of Consciousness: drowsy  Airway & Oxygen Therapy: Patient Spontanous Breathing  Post-op Assessment: Report given to RN and Post -op Vital signs reviewed and stable  Post vital signs: Reviewed and stable  Last Vitals:  Vitals Value Taken Time  BP 146/72 10/14/23 1416  Temp    Pulse 79 10/14/23 1417  Resp 17 10/14/23 1417  SpO2 100 % 10/14/23 1417  Vitals shown include unfiled device data.  Last Pain:  Vitals:   10/14/23 1401  TempSrc:   PainSc: 0-No pain         Complications: No notable events documented.

## 2023-10-14 NOTE — Anesthesia Postprocedure Evaluation (Signed)
 Anesthesia Post Note  Patient: Brenda Reynolds  Procedure(s) Performed: ESOPHAGOGASTRODUODENOSCOPY (EGD) WITH PROPOFOL  ESOPHAGEAL DILATION  Patient location during evaluation: PACU Anesthesia Type: General Level of consciousness: awake and alert Pain management: pain level controlled Vital Signs Assessment: post-procedure vital signs reviewed and stable Respiratory status: spontaneous breathing, nonlabored ventilation, respiratory function stable and patient connected to nasal cannula oxygen Cardiovascular status: blood pressure returned to baseline and stable Postop Assessment: no apparent nausea or vomiting Anesthetic complications: no   There were no known notable events for this encounter.   Last Vitals:  Vitals:   10/14/23 1152 10/14/23 1418  BP: (!) 183/89 (!) 146/72  Pulse: 90 82  Resp: (!) 21 13  Temp: 36.7 C 36.6 C  SpO2: 94% 100%    Last Pain:  Vitals:   10/14/23 1418  TempSrc: Oral  PainSc: 0-No pain                 Leylani Duley L Tatiyanna Lashley

## 2023-10-14 NOTE — Anesthesia Procedure Notes (Signed)
 Date/Time: 10/14/2023 2:01 PM  Performed by: Eliodoro Deward FALCON, CRNAPre-anesthesia Checklist: Patient identified, Emergency Drugs available, Suction available and Patient being monitored Patient Re-evaluated:Patient Re-evaluated prior to induction Oxygen Delivery Method: Nasal cannula Induction Type: IV induction Placement Confirmation: positive ETCO2 Comments: Optiflow High Flow [place] O2 used.

## 2023-10-15 ENCOUNTER — Encounter (INDEPENDENT_AMBULATORY_CARE_PROVIDER_SITE_OTHER): Payer: Medicare Other | Admitting: Ophthalmology

## 2023-10-16 ENCOUNTER — Encounter (HOSPITAL_COMMUNITY): Payer: Self-pay | Admitting: Gastroenterology

## 2023-10-16 NOTE — Progress Notes (Signed)
[facility] - Clinic Note  10/17/2023     CHIEF COMPLAINT Patient presents for Retina Follow Up   HISTORY OF PRESENT ILLNESS: Brenda Reynolds is a 89 y.o. female who presents to the clinic today for:   HPI     Retina Follow Up   Patient presents with  CRVO/BRVO.  In right eye.  Severity is moderate.  Duration of 6 weeks.  Since onset it is stable.  I, the attending physician,  performed the HPI with the patient and updated documentation appropriately.        Comments   Patient feels that she is having trouble reading. She feels that she's old and tired. She is using AT's.       Last edited by Rennis Chris, MD on 10/17/2023  5:04 PM.     Pt states when she is reading, she reads words that are not there, for the past couple of days, it has felt like something is in here eyes  Referring physician: Toma Deiters, MD [address] [place],  [place] [address]  HISTORICAL INFORMATION:   Selected notes from the MEDICAL RECORD NUMBER Referred by Dr. Luciana Axe due to insurance network LEE:  Ocular Hx- PMH-    CURRENT MEDICATIONS: No current outpatient medications on file. (Ophthalmic Drugs)   No current facility-administered medications for this visit. (Ophthalmic Drugs)   Current Outpatient Medications (Other)  Medication Sig   acetaminophen (TYLENOL) 500 MG tablet Take 500-1,000 mg by mouth every 6 (six) hours as needed for moderate pain.   alendronate (FOSAMAX) 70 MG tablet Take 70 mg by mouth every Tuesday.   calcium-vitamin D (OSCAL WITH D) 500-200 MG-UNIT tablet Take 1 tablet by mouth 3 (three) times daily.   colestipol (COLESTID) 1 g tablet Take 1 tablet (1 g total) by mouth daily.   diclofenac Sodium (VOLTAREN) 1 % GEL Apply 2 g topically 4 (four) times daily.   famotidine (PEPCID) 40 MG tablet Take 1 tablet (40 mg total) by mouth every evening.   folic acid (FOLVITE) 400 MCG tablet Take 400 mcg by mouth daily.   gabapentin (NEURONTIN) 300 MG  capsule Take 300 mg by mouth 3 (three) times daily.   glucosamine-chondroitin 500-400 MG tablet Take 1 tablet by mouth daily.   HYDROcodone-acetaminophen (NORCO/VICODIN) 5-325 MG tablet Take 1 tablet by mouth 2 (two) times daily as needed for moderate pain.   losartan (COZAAR) 100 MG tablet Take 100 mg by mouth daily.   Multiple Vitamin (MULTIVITAMIN) tablet Take 1 tablet by mouth daily.   Omega-3 Fatty Acids (FISH OIL) 1000 MG CAPS Take 2,000 mg by mouth daily.   pantoprazole (PROTONIX) 40 MG tablet Take 1 tablet (40 mg total) by mouth daily.   vitamin B-12 (CYANOCOBALAMIN) 500 MCG tablet Take 500 mcg by mouth daily.   vitamin C (ASCORBIC ACID) 500 MG tablet Take 1 tablet (500 mg total) by mouth daily.   No current facility-administered medications for this visit. (Other)   REVIEW OF SYSTEMS: ROS   Positive for: Gastrointestinal, Cardiovascular, Eyes Negative for: Constitutional, Neurological, Skin, Genitourinary, Musculoskeletal, HENT, Endocrine, Respiratory, Psychiatric, Allergic/Imm, Heme/Lymph Last edited by Charlette Caffey, COT on 10/17/2023  1:23 PM.       ALLERGIES Allergies  Allergen Reactions   Lidocaine Rash    Patch   PAST MEDICAL HISTORY Past Medical History:  Diagnosis Date   GERD (gastroesophageal reflux disease)    Hypertension    Intertrochanteric fracture of left femur (HCC)  Osteoporosis    Scoliosis    Past Surgical History:  Procedure Laterality Date   BIOPSY  03/09/2021   Procedure: BIOPSY;  Surgeon: Dolores Frame, MD;  [facility];  Service: Gastroenterology;;   CATARACT EXTRACTION     COLONOSCOPY     Per patient this was done by Dr. Teena Dunk   COLONOSCOPY WITH PROPOFOL N/A 03/09/2021   Procedure: COLONOSCOPY WITH PROPOFOL;  Surgeon: Dolores Frame, MD;  [facility]: [facility];  Service: Gastroenterology;  Laterality: N/A;  10:50   DILATION AND CURETTAGE OF UTERUS     ESOPHAGEAL DILATION N/A 03/09/2021    Procedure: ESOPHAGEAL DILATION;  Surgeon: Dolores Frame, MD;  [facility];  Service: Gastroenterology;  Laterality: N/A;   ESOPHAGEAL DILATION  10/14/2023   Procedure: ESOPHAGEAL DILATION;  Surgeon: Marguerita Merles, Reuel Boom, MD;  [facility];  Service: Gastroenterology;;   ESOPHAGOGASTRODUODENOSCOPY (EGD) WITH PROPOFOL N/A 03/09/2021   Procedure: ESOPHAGOGASTRODUODENOSCOPY (EGD) WITH PROPOFOL;  Surgeon: Dolores Frame, MD;  [facility];  Service: Gastroenterology;  Laterality: N/A;   ESOPHAGOGASTRODUODENOSCOPY (EGD) WITH PROPOFOL N/A 10/14/2023   Procedure: ESOPHAGOGASTRODUODENOSCOPY (EGD) WITH PROPOFOL;  Surgeon: Dolores Frame, MD;  [facility];  Service: Gastroenterology;  Laterality: N/A;  1:30PM;ASA 3   EYE SURGERY     Bilateral cataracts   INTRAMEDULLARY (IM) NAIL INTERTROCHANTERIC Left 07/04/2019   Procedure: INTRAMEDULLARY (IM) NAIL INTERTROCHANTRIC;  Surgeon: Samson Frederic, MD;  [facility]: [facility];  Service: Orthopedics;  Laterality: Left;   TONSILLECTOMY     UPPER GASTROINTESTINAL ENDOSCOPY     Done by Dr. Gabriel Cirri   FAMILY HISTORY Family History  Problem Relation Age of Onset   Cancer Mother    SOCIAL HISTORY Social History   Tobacco Use   Smoking status: Former    Passive exposure: Past   Smokeless tobacco: Never  Vaping Use   Vaping status: Never Used  Substance Use Topics   Alcohol use: Yes    Alcohol/week: 1.0 standard drink of alcohol    Types: 1 Glasses of wine per week   Drug use: Not Currently       OPHTHALMIC EXAM:  Base Eye Exam     Visual Acuity (Snellen - Linear)       Right Left   Dist cc 20/60 20/30   Dist ph cc 20/50 NI    Correction: Glasses         Tonometry (Tonopen, 1:29 PM)       Right Left   Pressure 14 14         Pupils       Dark Light Shape React APD   Right 2 2 Round NR None   Left 2 2 Round NR None         Visual Fields       Left  Right    Full Full         Extraocular Movement       Right Left    Full, Ortho Full, Ortho         Neuro/Psych     Oriented x3: Yes   Mood/Affect: Normal         Dilation     Both eyes: 2.5% Phenylephrine, 1.0% Mydriacyl @ 1:24 PM           Slit Lamp and Fundus Exam     External Exam       Right Left   External Normal Normal  Slit Lamp Exam       Right Left   Lids/Lashes Dermatochalasis - upper lid, Meibomian gland dysfunction Dermatochalasis - upper lid, Meibomian gland dysfunction   Conjunctiva/Sclera White and quiet White and quiet   Cornea arcus, 2+ Punctate epithelial erosions, Descemet's folds, tear film debris arcus, 1+ Punctate epithelial erosions, trace Descemet's folds, tear film debris   Anterior Chamber deep and clear deep and clear   Iris Round and dilated Round and dilated   Lens 3 piece PC IOL in good position with open PC 3 piece PC IOL in good position with open PC   Anterior Vitreous Vitreous syneresis, vitreous condensations, Posterior vitreous detachment Posterior vitreous detachment, vitreous condensations         Fundus Exam       Right Left   Disc Pink and Sharp, +hyperemia, vascular loops mild Pallor, Sharp rim, central hyperemia, mild PPA   C/D Ratio 0.3 0.2   Macula hazy view, Blunted foveal reflex, +edema and scattered IRH, +drusen Flat, Good foveal reflex, RPE mottling and clumping, Drusen, No heme or edema   Vessels attenuated, Tortuous, old CRVO attenuated, Tortuous   Periphery Attached, mild reticular degeneration Attached, mild reticular degeneration, No heme           Refraction     Wearing Rx       Sphere Cylinder Axis Add   Right -0.75 +2.00 178 +2.50   Left -0.50 +2.00 170 +2.50           IMAGING AND PROCEDURES  Imaging and Procedures for 10/17/2023  OCT, Retina - OU - Both Eyes       Right Eye Quality was borderline. Scan locations included subfoveal. Central Foveal Thickness: 206.  Progression has improved. Findings include abnormal foveal contour, intraretinal fluid, subretinal fluid, outer retinal atrophy (Trace cystic changes temporal fovea and macula -- persistent, SRF and foveal contour stably improved).   Left Eye Quality was borderline. Scan locations included subfoveal. Central Foveal Thickness: 264. Progression has been stable. Findings include normal foveal contour, no IRF, no SRF, retinal drusen .   Notes *Images captured and stored on drive  Diagnosis / Impression:  OD: shallow cystic changes temporal fovea and macula -- improved, SRF and foveal contour stably improved OS: NFP, no IRF/SRF  Clinical management:  See below  Abbreviations: NFP - Normal foveal profile. CME - cystoid macular edema. PED - pigment epithelial detachment. IRF - intraretinal fluid. SRF - subretinal fluid. EZ - ellipsoid zone. ERM - epiretinal membrane. ORA - outer retinal atrophy. ORT - outer retinal tubulation. SRHM - subretinal hyper-reflective material. IRHM - intraretinal hyper-reflective material      Intravitreal Injection, Pharmacologic Agent - OD - Right Eye       Time Out 10/17/2023. 1:30 PM. Confirmed correct patient, procedure, site, and patient consented.   Anesthesia Topical anesthesia was used. Anesthetic medications included Lidocaine 2%, Proparacaine 0.5%.   Procedure Preparation included 5% betadine to ocular surface, eyelid speculum. A supplied (32g) needle was used.   Injection: 1.25 mg Bevacizumab 1.25mg /0.69ml   Route: Intravitreal, Site: Right Eye   NDC: [id number], Lot: [id number], Expiration date: 11/13/2023   Post-op Post injection exam found visual acuity of at least counting fingers. The patient tolerated the procedure well. There were no complications. The patient received written and verbal post procedure care education.            ASSESSMENT/PLAN:    ICD-10-CM   1. Central retinal vein occlusion with macular edema of right  eye   H34.8110 OCT, Retina - OU - Both Eyes    Intravitreal Injection, Pharmacologic Agent - OD - Right Eye    Bevacizumab (AVASTIN) SOLN 1.25 mg    2. Intermediate stage nonexudative age-related macular degeneration of both eyes  H35.3132     3. Essential hypertension  I10     4. Hypertensive retinopathy of both eyes  H35.033     5. Pseudophakia of both eyes  Z96.1     6. Dry eyes  H04.123      CRVO w/ CME, OD  - previous Dr. Luciana Axe pt here for insurance reasons  - history of IVA OD x13 (last one 09.14.23 -- 14 wks) - s/p IVA OD #14 (12.19.23), #15 (01.30.24), #16 (03.12.24), #17 (04.23.24)  - s/p IVE OD #1 (06.04.24), #2 (07.16.24), #3 (08.28.24), #4 (10.15.24), #5 (11.26.24) - BCVA 20/50 -- stable - OCT shows OD: shallow cystic changes temporal fovea and macula -- improved, SRF and foveal contour stably improved; OS: NFP, no IRF/SRF at 6 weeks  - recommend switching back to IVA OD #18 today 01.17.25. with follow up back to 5 weeks  - RBA of procedure discussed, questions answered - IVA informed consent obtained and signed 01.17.25  - see procedure note - Eylea approved for 2025 -- but funding for Good Days unavailable - f/u 5 wks -- DFE/OCT, possible injection  2. Age related macular degeneration, non-exudative, OU  - mild-intermediate stage - The incidence, anatomy, and pathology of dry AMD, risk of progression, and the AREDS and AREDS 2 studies including smoking risks discussed with patient.  - Recommend amsler grid monitoring  3,4. Hypertensive retinopathy OU - discussed importance of tight BP control - continue to monitor  5. Pseudophakia OU  - s/p CE/IOL OU  - IOLs in good position, doing well  - continue to monitor  6. Dry eyes OU - recommend artificial tears and lubricating ointment as needed  Ophthalmic Meds Ordered this visit:  Meds ordered this encounter  Medications   Bevacizumab (AVASTIN) SOLN 1.25 mg     Return in about 5 weeks (around 11/21/2023) for f/u  CRVO OD, DFE, OCT, Possible Injxn.  There are no Patient Instructions on file for this visit.  This document serves as a record of services personally performed by Karie Chimera, MD, PhD. It was created on their behalf by Berlin Hun COT, an ophthalmic technician. The creation of this record is the provider's dictation and/or activities during the visit.    Electronically signed by: Berlin Hun COT 01.16.25 3:40 PM  This document serves as a record of services personally performed by Karie Chimera, MD, PhD. It was created on their behalf by Glee Arvin. Manson Passey, OA an ophthalmic technician. The creation of this record is the provider's dictation and/or activities during the visit.    Electronically signed by: Glee Arvin. Manson Passey, OA 10/19/23 3:40 PM  Karie Chimera, M.D., Ph.D. Diseases & Surgery of the Retina and Vitreous [facility] 10/17/2023   I have reviewed the above documentation for accuracy and completeness, and I agree with the above. Karie Chimera, M.D., Ph.D. 10/19/23 3:42 PM   Abbreviations: M myopia (nearsighted); A astigmatism; H hyperopia (farsighted); P presbyopia; Mrx spectacle prescription;  CTL contact lenses; OD right eye; OS left eye; OU both eyes  XT exotropia; ET esotropia; PEK punctate epithelial keratitis; PEE punctate epithelial erosions; DES dry eye syndrome; MGD meibomian gland dysfunction; ATs artificial tears; PFAT's preservative free artificial  tears; NSC nuclear sclerotic cataract; PSC posterior subcapsular cataract; ERM epi-retinal membrane; PVD posterior vitreous detachment; RD retinal detachment; DM diabetes mellitus; DR diabetic retinopathy; NPDR non-proliferative diabetic retinopathy; PDR proliferative diabetic retinopathy; CSME clinically significant macular edema; DME diabetic macular edema; dbh dot blot hemorrhages; CWS cotton wool spot; POAG primary open angle glaucoma; C/D cup-to-disc ratio; HVF humphrey visual  field; GVF goldmann visual field; OCT optical coherence tomography; IOP intraocular pressure; BRVO Branch retinal vein occlusion; CRVO central retinal vein occlusion; CRAO central retinal artery occlusion; BRAO branch retinal artery occlusion; RT retinal tear; SB scleral buckle; PPV pars plana vitrectomy; VH Vitreous hemorrhage; PRP panretinal laser photocoagulation; IVK intravitreal kenalog; VMT vitreomacular traction; MH Macular hole;  NVD neovascularization of the disc; NVE neovascularization elsewhere; AREDS age related eye disease study; ARMD age related macular degeneration; POAG primary open angle glaucoma; EBMD epithelial/anterior basement membrane dystrophy; ACIOL anterior chamber intraocular lens; IOL intraocular lens; PCIOL posterior chamber intraocular lens; Phaco/IOL phacoemulsification with intraocular lens placement; PRK photorefractive keratectomy; LASIK laser assisted in situ keratomileusis; HTN hypertension; DM diabetes mellitus; COPD chronic obstructive pulmonary disease

## 2023-10-17 ENCOUNTER — Ambulatory Visit (INDEPENDENT_AMBULATORY_CARE_PROVIDER_SITE_OTHER): Payer: Medicare Other | Admitting: Ophthalmology

## 2023-10-17 ENCOUNTER — Encounter (INDEPENDENT_AMBULATORY_CARE_PROVIDER_SITE_OTHER): Payer: Self-pay | Admitting: Ophthalmology

## 2023-10-17 DIAGNOSIS — H34811 Central retinal vein occlusion, right eye, with macular edema: Secondary | ICD-10-CM | POA: Diagnosis not present

## 2023-10-17 DIAGNOSIS — H04123 Dry eye syndrome of bilateral lacrimal glands: Secondary | ICD-10-CM

## 2023-10-17 DIAGNOSIS — H35033 Hypertensive retinopathy, bilateral: Secondary | ICD-10-CM

## 2023-10-17 DIAGNOSIS — H353132 Nonexudative age-related macular degeneration, bilateral, intermediate dry stage: Secondary | ICD-10-CM | POA: Diagnosis not present

## 2023-10-17 DIAGNOSIS — I10 Essential (primary) hypertension: Secondary | ICD-10-CM

## 2023-10-17 DIAGNOSIS — Z961 Presence of intraocular lens: Secondary | ICD-10-CM

## 2023-10-17 MED ORDER — BEVACIZUMAB CHEMO INJECTION 1.25MG/0.05ML SYRINGE FOR KALEIDOSCOPE
1.2500 mg | INTRAVITREAL | Status: AC | PRN
  Administered 2023-10-17: 1.25 mg via INTRAVITREAL

## 2023-11-18 NOTE — Progress Notes (Shared)
[facility] - Clinic Note  11/21/2023     CHIEF COMPLAINT Patient presents for No chief complaint on file.   HISTORY OF PRESENT ILLNESS: Brenda Reynolds is a 89 y.o. female who presents to the clinic today for:     Pt states when she is reading, she reads words that are not there, for the past couple of days, it has felt like something is in here eyes  Referring physician: Toma Deiters, MD [address] [place],  [place] [address]  HISTORICAL INFORMATION:   Selected notes from the MEDICAL RECORD NUMBER Referred by Dr. Luciana Axe due to insurance network LEE:  Ocular Hx- PMH-    CURRENT MEDICATIONS: No current outpatient medications on file. (Ophthalmic Drugs)   No current facility-administered medications for this visit. (Ophthalmic Drugs)   Current Outpatient Medications (Other)  Medication Sig   acetaminophen (TYLENOL) 500 MG tablet Take 500-1,000 mg by mouth every 6 (six) hours as needed for moderate pain.   alendronate (FOSAMAX) 70 MG tablet Take 70 mg by mouth every Tuesday.   calcium-vitamin D (OSCAL WITH D) 500-200 MG-UNIT tablet Take 1 tablet by mouth 3 (three) times daily.   colestipol (COLESTID) 1 g tablet Take 1 tablet (1 g total) by mouth daily.   diclofenac Sodium (VOLTAREN) 1 % GEL Apply 2 g topically 4 (four) times daily.   famotidine (PEPCID) 40 MG tablet Take 1 tablet (40 mg total) by mouth every evening.   folic acid (FOLVITE) 400 MCG tablet Take 400 mcg by mouth daily.   gabapentin (NEURONTIN) 300 MG capsule Take 300 mg by mouth 3 (three) times daily.   glucosamine-chondroitin 500-400 MG tablet Take 1 tablet by mouth daily.   HYDROcodone-acetaminophen (NORCO/VICODIN) 5-325 MG tablet Take 1 tablet by mouth 2 (two) times daily as needed for moderate pain.   losartan (COZAAR) 100 MG tablet Take 100 mg by mouth daily.   Multiple Vitamin (MULTIVITAMIN) tablet Take 1 tablet by mouth daily.   Omega-3 Fatty Acids (FISH OIL) 1000 MG CAPS Take  2,000 mg by mouth daily.   pantoprazole (PROTONIX) 40 MG tablet Take 1 tablet (40 mg total) by mouth daily.   vitamin B-12 (CYANOCOBALAMIN) 500 MCG tablet Take 500 mcg by mouth daily.   vitamin C (ASCORBIC ACID) 500 MG tablet Take 1 tablet (500 mg total) by mouth daily.   No current facility-administered medications for this visit. (Other)   REVIEW OF SYSTEMS:     ALLERGIES Allergies  Allergen Reactions   Lidocaine Rash    Patch   PAST MEDICAL HISTORY Past Medical History:  Diagnosis Date   GERD (gastroesophageal reflux disease)    Hypertension    Intertrochanteric fracture of left femur (HCC)    Osteoporosis    Scoliosis    Past Surgical History:  Procedure Laterality Date   BIOPSY  03/09/2021   Procedure: BIOPSY;  Surgeon: Dolores Frame, MD;  [facility];  Service: Gastroenterology;;   CATARACT EXTRACTION     COLONOSCOPY     Per patient this was done by Dr. Teena Dunk   COLONOSCOPY WITH PROPOFOL N/A 03/09/2021   Procedure: COLONOSCOPY WITH PROPOFOL;  Surgeon: Dolores Frame, MD;  [facility];  Service: Gastroenterology;  Laterality: N/A;  10:50   DILATION AND CURETTAGE OF UTERUS     ESOPHAGEAL DILATION N/A 03/09/2021   Procedure: ESOPHAGEAL DILATION;  Surgeon: Dolores Frame, MD;  [facility];  Service: Gastroenterology;  Laterality: N/A;  ESOPHAGEAL DILATION  10/14/2023   Procedure: ESOPHAGEAL DILATION;  Surgeon: Marguerita Merles, Reuel Boom, MD;  [facility];  Service: Gastroenterology;;   ESOPHAGOGASTRODUODENOSCOPY (EGD) WITH PROPOFOL N/A 03/09/2021   Procedure: ESOPHAGOGASTRODUODENOSCOPY (EGD) WITH PROPOFOL;  Surgeon: Dolores Frame, MD;  [facility];  Service: Gastroenterology;  Laterality: N/A;   ESOPHAGOGASTRODUODENOSCOPY (EGD) WITH PROPOFOL N/A 10/14/2023   Procedure: ESOPHAGOGASTRODUODENOSCOPY (EGD) WITH PROPOFOL;  Surgeon: Dolores Frame, MD;  [facility];  Service: Gastroenterology;  Laterality: N/A;  1:30PM;ASA 3   EYE SURGERY     Bilateral cataracts   INTRAMEDULLARY (IM) NAIL INTERTROCHANTERIC Left 07/04/2019   Procedure: INTRAMEDULLARY (IM) NAIL INTERTROCHANTRIC;  Surgeon: Samson Frederic, MD;  [facility]: [facility];  Service: Orthopedics;  Laterality: Left;   TONSILLECTOMY     UPPER GASTROINTESTINAL ENDOSCOPY     Done by Dr. Gabriel Cirri   FAMILY HISTORY Family History  Problem Relation Age of Onset   Cancer Mother    SOCIAL HISTORY Social History   Tobacco Use   Smoking status: Former    Passive exposure: Past   Smokeless tobacco: Never  Vaping Use   Vaping status: Never Used  Substance Use Topics   Alcohol use: Yes    Alcohol/week: 1.0 standard drink of alcohol    Types: 1 Glasses of wine per week   Drug use: Not Currently       OPHTHALMIC EXAM:  Not recorded    IMAGING AND PROCEDURES  Imaging and Procedures for 11/21/2023          ASSESSMENT/PLAN:  No diagnosis found.  CRVO w/ CME, OD  - previous Dr. Luciana Axe pt here for insurance reasons  - history of IVA OD x13 (last one 09.14.23 -- 14 wks) - s/p IVA OD #14 (12.19.23), #15 (01.30.24), #16 (03.12.24), #17 (04.23.24) #18(01.17.25) - s/p IVE OD #1 (06.04.24), #2 (07.16.24), #3 (08.28.24), #4 (10.15.24), #5 (11.26.24) - BCVA 20/50 -- stable - OCT shows OD: shallow cystic changes temporal fovea and macula -- improved, SRF and foveal contour stably improved; OS: NFP, no IRF/SRF at 6 weeks  - recommend switching back to IVA OD #19 today 02.21.25. with follow up back to 5 weeks  - RBA of procedure discussed, questions answered - IVA informed consent obtained and signed 01.17.25  - see procedure note - Eylea approved for 2025 -- but funding for Good Days unavailable - f/u 5 wks -- DFE/OCT, possible injection  2. Age related macular degeneration, non-exudative, OU  - mild-intermediate stage - The incidence, anatomy, and pathology of dry AMD, risk of  progression, and the AREDS and AREDS 2 studies including smoking risks discussed with patient.  - Recommend amsler grid monitoring  3,4. Hypertensive retinopathy OU - discussed importance of tight BP control - continue to monitor  5. Pseudophakia OU  - s/p CE/IOL OU  - IOLs in good position, doing well  - continue to monitor  6. Dry eyes OU - recommend artificial tears and lubricating ointment as needed  Ophthalmic Meds Ordered this visit:  No orders of the defined types were placed in this encounter.    No follow-ups on file.  There are no Patient Instructions on file for this visit. This document serves as a record of services personally performed by Karie Chimera, MD, PhD. It was created on their behalf by Berlin Hun COT, an ophthalmic technician. The creation of this record is the provider's dictation and/or activities during the visit.    Electronically signed by: Berlin Hun  COT 02.1810:47 AM    Abbreviations: M myopia (nearsighted); A astigmatism; H hyperopia (farsighted); P presbyopia; Mrx spectacle prescription;  CTL contact lenses; OD right eye; OS left eye; OU both eyes  XT exotropia; ET esotropia; PEK punctate epithelial keratitis; PEE punctate epithelial erosions; DES dry eye syndrome; MGD meibomian gland dysfunction; ATs artificial tears; PFAT's preservative free artificial tears; NSC nuclear sclerotic cataract; PSC posterior subcapsular cataract; ERM epi-retinal membrane; PVD posterior vitreous detachment; RD retinal detachment; DM diabetes mellitus; DR diabetic retinopathy; NPDR non-proliferative diabetic retinopathy; PDR proliferative diabetic retinopathy; CSME clinically significant macular edema; DME diabetic macular edema; dbh dot blot hemorrhages; CWS cotton wool spot; POAG primary open angle glaucoma; C/D cup-to-disc ratio; HVF humphrey visual field; GVF goldmann visual field; OCT optical coherence tomography; IOP intraocular pressure; BRVO Branch  retinal vein occlusion; CRVO central retinal vein occlusion; CRAO central retinal artery occlusion; BRAO branch retinal artery occlusion; RT retinal tear; SB scleral buckle; PPV pars plana vitrectomy; VH Vitreous hemorrhage; PRP panretinal laser photocoagulation; IVK intravitreal kenalog; VMT vitreomacular traction; MH Macular hole;  NVD neovascularization of the disc; NVE neovascularization elsewhere; AREDS age related eye disease study; ARMD age related macular degeneration; POAG primary open angle glaucoma; EBMD epithelial/anterior basement membrane dystrophy; ACIOL anterior chamber intraocular lens; IOL intraocular lens; PCIOL posterior chamber intraocular lens; Phaco/IOL phacoemulsification with intraocular lens placement; PRK photorefractive keratectomy; LASIK laser assisted in situ keratomileusis; HTN hypertension; DM diabetes mellitus; COPD chronic obstructive pulmonary disease

## 2023-11-21 ENCOUNTER — Encounter (INDEPENDENT_AMBULATORY_CARE_PROVIDER_SITE_OTHER): Payer: Medicare Other | Admitting: Ophthalmology

## 2023-11-24 ENCOUNTER — Ambulatory Visit (INDEPENDENT_AMBULATORY_CARE_PROVIDER_SITE_OTHER): Payer: Medicare Other | Admitting: Gastroenterology

## 2023-11-26 NOTE — Progress Notes (Signed)
 [facility] - Clinic Note  11/27/2023     CHIEF COMPLAINT Patient presents for Retina Follow Up   HISTORY OF PRESENT ILLNESS: Brenda Reynolds is a 89 y.o. female who presents to the clinic today for:   HPI     Retina Follow Up   Patient presents with  CRVO/BRVO.  In right eye.  This started 5 weeks ago.  I, the attending physician,  performed the HPI with the patient and updated documentation appropriately.        Comments   Patient here for 5 weeks retina follow up for CRVO OD. Patient states vision about the same. No eye pain.       Last edited by Rennis Chris, MD on 11/27/2023  3:44 PM.    Pt states she is still having the same symptoms of seeing things that are not there, she states she noticed her vision was blurry when reading the eye chart, but when they put drops in, it cleared up  Referring physician: Toma Deiters, MD [address] [place],  [place] [address]  HISTORICAL INFORMATION:   Selected notes from the MEDICAL RECORD NUMBER Referred by Dr. Luciana Axe due to insurance network LEE:  Ocular Hx- PMH-    CURRENT MEDICATIONS: No current outpatient medications on file. (Ophthalmic Drugs)   No current facility-administered medications for this visit. (Ophthalmic Drugs)   Current Outpatient Medications (Other)  Medication Sig   acetaminophen (TYLENOL) 500 MG tablet Take 500-1,000 mg by mouth every 6 (six) hours as needed for moderate pain.   alendronate (FOSAMAX) 70 MG tablet Take 70 mg by mouth every Tuesday.   calcium-vitamin D (OSCAL WITH D) 500-200 MG-UNIT tablet Take 1 tablet by mouth 3 (three) times daily.   colestipol (COLESTID) 1 g tablet Take 1 tablet (1 g total) by mouth daily.   diclofenac Sodium (VOLTAREN) 1 % GEL Apply 2 g topically 4 (four) times daily.   famotidine (PEPCID) 40 MG tablet Take 1 tablet (40 mg total) by mouth every evening.   folic acid (FOLVITE) 400 MCG tablet Take 400 mcg by mouth daily.   gabapentin  (NEURONTIN) 300 MG capsule Take 300 mg by mouth 3 (three) times daily.   glucosamine-chondroitin 500-400 MG tablet Take 1 tablet by mouth daily.   HYDROcodone-acetaminophen (NORCO/VICODIN) 5-325 MG tablet Take 1 tablet by mouth 2 (two) times daily as needed for moderate pain.   losartan (COZAAR) 100 MG tablet Take 100 mg by mouth daily.   Multiple Vitamin (MULTIVITAMIN) tablet Take 1 tablet by mouth daily.   Omega-3 Fatty Acids (FISH OIL) 1000 MG CAPS Take 2,000 mg by mouth daily.   pantoprazole (PROTONIX) 40 MG tablet Take 1 tablet (40 mg total) by mouth daily.   vitamin B-12 (CYANOCOBALAMIN) 500 MCG tablet Take 500 mcg by mouth daily.   vitamin C (ASCORBIC ACID) 500 MG tablet Take 1 tablet (500 mg total) by mouth daily.   No current facility-administered medications for this visit. (Other)   REVIEW OF SYSTEMS: ROS   Positive for: Gastrointestinal, Cardiovascular, Eyes Negative for: Constitutional, Neurological, Skin, Genitourinary, Musculoskeletal, HENT, Endocrine, Respiratory, Psychiatric, Allergic/Imm, Heme/Lymph Last edited by Laddie Aquas, COA on 11/27/2023  2:15 PM.     ALLERGIES Allergies  Allergen Reactions   Lidocaine Rash    Patch   PAST MEDICAL HISTORY Past Medical History:  Diagnosis Date   GERD (gastroesophageal reflux disease)    Hypertension    Intertrochanteric fracture of left femur (HCC)  Osteoporosis    Scoliosis    Past Surgical History:  Procedure Laterality Date   BIOPSY  03/09/2021   Procedure: BIOPSY;  Surgeon: Dolores Frame, MD;  Location: [facility];  Service: Gastroenterology;;   CATARACT EXTRACTION     COLONOSCOPY     Per patient this was done by Dr. Teena Dunk   COLONOSCOPY WITH PROPOFOL N/A 03/09/2021   Procedure: COLONOSCOPY WITH PROPOFOL;  Surgeon: Dolores Frame, MD;  Location: [facility];  Service: Gastroenterology;  Laterality: N/A;  10:50   DILATION AND CURETTAGE OF UTERUS     ESOPHAGEAL DILATION N/A  03/09/2021   Procedure: ESOPHAGEAL DILATION;  Surgeon: Dolores Frame, MD;  Location: [facility];  Service: Gastroenterology;  Laterality: N/A;   ESOPHAGEAL DILATION  10/14/2023   Procedure: ESOPHAGEAL DILATION;  Surgeon: Marguerita Merles, Reuel Boom, MD;  Location: [facility];  Service: Gastroenterology;;   ESOPHAGOGASTRODUODENOSCOPY (EGD) WITH PROPOFOL N/A 03/09/2021   Procedure: ESOPHAGOGASTRODUODENOSCOPY (EGD) WITH PROPOFOL;  Surgeon: Dolores Frame, MD;  [facility]: [facility];  Service: Gastroenterology;  Laterality: N/A;   ESOPHAGOGASTRODUODENOSCOPY (EGD) WITH PROPOFOL N/A 10/14/2023   Procedure: ESOPHAGOGASTRODUODENOSCOPY (EGD) WITH PROPOFOL;  Surgeon: Dolores Frame, MD;  Location: [facility];  Service: Gastroenterology;  Laterality: N/A;  1:30PM;ASA 3   EYE SURGERY     Bilateral cataracts   INTRAMEDULLARY (IM) NAIL INTERTROCHANTERIC Left 07/04/2019   Procedure: INTRAMEDULLARY (IM) NAIL INTERTROCHANTRIC;  Surgeon: Samson Frederic, MD;  Location: [facility];  Service: Orthopedics;  Laterality: Left;   TONSILLECTOMY     UPPER GASTROINTESTINAL ENDOSCOPY     Done by Dr. Gabriel Cirri   FAMILY HISTORY Family History  Problem Relation Age of Onset   Cancer Mother    SOCIAL HISTORY Social History   Tobacco Use   Smoking status: Former    Passive exposure: Past   Smokeless tobacco: Never  Vaping Use   Vaping status: Never Used  Substance Use Topics   Alcohol use: Yes    Alcohol/week: 1.0 standard drink of alcohol    Types: 1 Glasses of wine per week   Drug use: Not Currently       OPHTHALMIC EXAM:  Base Eye Exam     Visual Acuity (Snellen - Linear)       Right Left   Dist cc 20/60 -2 20/30   Dist ph cc 20/50 NI    Correction: Glasses         Tonometry (Tonopen, 2:13 PM)       Right Left   Pressure 18 17         Pupils       Dark Light Shape React APD   Right 2 2 Round NR None   Left 2 2 Round NR None         Visual Fields  (Counting fingers)       Left Right    Full Full         Extraocular Movement       Right Left    Full, Ortho Full, Ortho         Neuro/Psych     Oriented x3: Yes   Mood/Affect: Normal         Dilation     Both eyes: 1.0% Mydriacyl, 2.5% Phenylephrine @ 2:13 PM           Slit Lamp and Fundus Exam     External Exam       Right Left   External Normal  Normal         Slit Lamp Exam       Right Left   Lids/Lashes Dermatochalasis - upper lid, Meibomian gland dysfunction Dermatochalasis - upper lid, Meibomian gland dysfunction   Conjunctiva/Sclera White and quiet White and quiet   Cornea arcus, 2+ Punctate epithelial erosions, Descemet's folds, tear film debris arcus, 1+ Punctate epithelial erosions, trace Descemet's folds, tear film debris   Anterior Chamber deep and clear deep and clear   Iris Round and dilated Round and dilated   Lens 3 piece PC IOL in good position with open PC 3 piece PC IOL in good position with open PC   Anterior Vitreous Vitreous syneresis, vitreous condensations, Posterior vitreous detachment Posterior vitreous detachment, vitreous condensations         Fundus Exam       Right Left   Disc Pink and Sharp, +hyperemia, vascular loops mild Pallor, Sharp rim, central hyperemia, mild PPA   C/D Ratio 0.3 0.2   Macula hazy view, Blunted foveal reflex, stable improvement in cystic change / edema, scattered IRH, +drusen Flat, Good foveal reflex, RPE mottling and clumping, Drusen, No heme or edema   Vessels attenuated, Tortuous, old CRVO attenuated, Tortuous   Periphery Attached, mild reticular degeneration Attached, mild reticular degeneration, No heme           Refraction     Wearing Rx       Sphere Cylinder Axis Add   Right -0.75 +2.00 178 +2.50   Left -0.50 +2.00 170 +2.50           IMAGING AND PROCEDURES  Imaging and Procedures for 11/27/2023  OCT, Retina - OU - Both Eyes       Right Eye Quality was borderline.  Scan locations included subfoveal. Central Foveal Thickness: 205. Progression has been stable. Findings include abnormal foveal contour, intraretinal fluid, subretinal fluid, outer retinal atrophy (Trace cystic changes temporal fovea and macula -- persistent, SRF and foveal contour stably improved).   Left Eye Quality was borderline. Scan locations included subfoveal. Central Foveal Thickness: 263. Progression has been stable. Findings include normal foveal contour, no IRF, no SRF, retinal drusen .   Notes *Images captured and stored on drive  Diagnosis / Impression:  OD: Trace cystic changes temporal fovea and macula -- persistent, SRF and foveal contour stably improved OS: NFP, no IRF/SRF  Clinical management:  See below  Abbreviations: NFP - Normal foveal profile. CME - cystoid macular edema. PED - pigment epithelial detachment. IRF - intraretinal fluid. SRF - subretinal fluid. EZ - ellipsoid zone. ERM - epiretinal membrane. ORA - outer retinal atrophy. ORT - outer retinal tubulation. SRHM - subretinal hyper-reflective material. IRHM - intraretinal hyper-reflective material      Intravitreal Injection, Pharmacologic Agent - OD - Right Eye       Time Out 11/27/2023. 3:31 PM. Confirmed correct patient, procedure, site, and patient consented.   Anesthesia Topical anesthesia was used. Anesthetic medications included Lidocaine 2%, Proparacaine 0.5%.   Procedure Preparation included 5% betadine to ocular surface, eyelid speculum. A supplied needle was used.   Injection: 1.25 mg Bevacizumab 1.25mg /0.50ml   Route: Intravitreal, Site: Right Eye   NDC: [id number], Lot: [id number], Expiration date: 01/03/2024   Post-op Post injection exam found visual acuity of at least counting fingers. The patient tolerated the procedure well. There were no complications. The patient received written and verbal post procedure care education.            ASSESSMENT/PLAN:  ICD-10-CM   1. 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 Intermediate stage nonexudative age-related macular degeneration of both eyes  H35.3132     3. Essential hypertension  I10     4. Hypertensive retinopathy of both eyes  H35.033     5. Pseudophakia of both eyes  Z96.1     6. Dry eyes  H04.123       CRVO w/ CME, OD  - previous Dr. Luciana Axe pt here for insurance reasons  - history of IVA OD x13 (last one 09.14.23 -- 14 wks) - s/p IVA OD #14 (12.19.23), #15 (01.30.24), #16 (03.12.24), #17 (04.23.24),#18 (01.17.25) - s/p IVE OD #1 (06.04.24), #2 (07.16.24), #3 (08.28.24), #4 (10.15.24), #5 (11.26.24) - BCVA 20/50 -- stable - OCT shows OD: Trace cystic changes temporal fovea and macula -- persistent, SRF and foveal contour stably improved; OS: NFP, no IRF/SRF at 5.9 weeks  - recommend IVA OD #19 today 02.27.25 with follow up in 6 weeks  - RBA of procedure discussed, questions answered - IVA informed consent obtained and signed 01.17.25  - see procedure note - Eylea approved for 2025 -- but funding for Good Days unavailable - f/u 6 wks -- DFE/OCT, possible injection  2. Age related macular degeneration, non-exudative, OU  - mild-intermediate stage - The incidence, anatomy, and pathology of dry AMD, risk of progression, and the AREDS and AREDS 2 studies including smoking risks discussed with patient.  - recommend Amsler grid monitoring  3,4. Hypertensive retinopathy OU - discussed importance of tight BP control - continue to monitor  5. Pseudophakia OU  - s/p CE/IOL OU  - IOLs in good position, doing well  - continue to monitor  6. Dry eyes OU - recommend artifical tears and lubricating ointment as needed  Ophthalmic Meds Ordered this visit:  Meds ordered this encounter  Medications   Bevacizumab (AVASTIN) SOLN 1.25 mg      Return in about 6 weeks (around 01/08/2024) for f/u CRVO OD, DFE, OCT, Possible Injxn.  There are no Patient Instructions on file for this visit.  This document serves as a record of services personally performed by Karie Chimera, MD, PhD. It was created on their behalf by Annalee Genta, COMT. The creation of this record is the provider's dictation and/or activities during the visit.  Electronically signed by: Annalee Genta, COMT 11/27/23 3:45 PM  This document serves as a record of services personally performed by Karie Chimera, MD, PhD. It was created on their behalf by Glee Arvin. Manson Passey, OA an ophthalmic technician. The creation of this record is the provider's dictation and/or activities during the visit.    Electronically signed by: Glee Arvin. Manson Passey, OA 11/27/23 3:45 PM  Karie Chimera, M.D., Ph.D. Diseases & Surgery of the Retina and Vitreous [facility] 11/27/2023   I have reviewed the above documentation for accuracy and completeness, and I agree with the above. Karie Chimera, M.D., Ph.D. 11/27/23 3:46 PM   Abbreviations: M myopia (nearsighted); A astigmatism; H hyperopia (farsighted); P presbyopia; Mrx spectacle prescription;  CTL contact lenses; OD right eye; OS left eye; OU both eyes  XT exotropia; ET esotropia; PEK punctate epithelial keratitis; PEE punctate epithelial erosions; DES dry eye syndrome; MGD meibomian gland dysfunction; ATs artificial  tears; PFAT's preservative free artificial tears; NSC nuclear sclerotic cataract; PSC posterior subcapsular cataract; ERM epi-retinal membrane; PVD posterior vitreous detachment; RD retinal detachment; DM diabetes mellitus; DR diabetic retinopathy; NPDR non-proliferative diabetic retinopathy; PDR proliferative diabetic retinopathy; CSME clinically significant macular edema; DME diabetic macular edema; dbh dot blot hemorrhages; CWS cotton wool spot; POAG primary open angle glaucoma; C/D cup-to-disc ratio; HVF  humphrey visual field; GVF goldmann visual field; OCT optical coherence tomography; IOP intraocular pressure; BRVO Branch retinal vein occlusion; CRVO central retinal vein occlusion; CRAO central retinal artery occlusion; BRAO branch retinal artery occlusion; RT retinal tear; SB scleral buckle; PPV pars plana vitrectomy; VH Vitreous hemorrhage; PRP panretinal laser photocoagulation; IVK intravitreal kenalog; VMT vitreomacular traction; MH Macular hole;  NVD neovascularization of the disc; NVE neovascularization elsewhere; AREDS age related eye disease study; ARMD age related macular degeneration; POAG primary open angle glaucoma; EBMD epithelial/anterior basement membrane dystrophy; ACIOL anterior chamber intraocular lens; IOL intraocular lens; PCIOL posterior chamber intraocular lens; Phaco/IOL phacoemulsification with intraocular lens placement; PRK photorefractive keratectomy; LASIK laser assisted in situ keratomileusis; HTN hypertension; DM diabetes mellitus; COPD chronic obstructive pulmonary disease

## 2023-11-27 ENCOUNTER — Encounter (INDEPENDENT_AMBULATORY_CARE_PROVIDER_SITE_OTHER): Payer: Self-pay | Admitting: Ophthalmology

## 2023-11-27 ENCOUNTER — Ambulatory Visit (INDEPENDENT_AMBULATORY_CARE_PROVIDER_SITE_OTHER): Payer: Medicare Other | Admitting: Ophthalmology

## 2023-11-27 DIAGNOSIS — H34811 Central retinal vein occlusion, right eye, with macular edema: Secondary | ICD-10-CM | POA: Diagnosis not present

## 2023-11-27 DIAGNOSIS — H35033 Hypertensive retinopathy, bilateral: Secondary | ICD-10-CM

## 2023-11-27 DIAGNOSIS — I10 Essential (primary) hypertension: Secondary | ICD-10-CM | POA: Diagnosis not present

## 2023-11-27 DIAGNOSIS — H353132 Nonexudative age-related macular degeneration, bilateral, intermediate dry stage: Secondary | ICD-10-CM

## 2023-11-27 DIAGNOSIS — Z961 Presence of intraocular lens: Secondary | ICD-10-CM

## 2023-11-27 DIAGNOSIS — H04123 Dry eye syndrome of bilateral lacrimal glands: Secondary | ICD-10-CM

## 2023-11-27 MED ORDER — BEVACIZUMAB CHEMO INJECTION 1.25MG/0.05ML SYRINGE FOR KALEIDOSCOPE
1.2500 mg | INTRAVITREAL | Status: AC | PRN
  Administered 2023-11-27: 1.25 mg via INTRAVITREAL

## 2023-12-09 DIAGNOSIS — N182 Chronic kidney disease, stage 2 (mild): Secondary | ICD-10-CM | POA: Diagnosis not present

## 2023-12-09 DIAGNOSIS — K2100 Gastro-esophageal reflux disease with esophagitis, without bleeding: Secondary | ICD-10-CM | POA: Diagnosis not present

## 2023-12-09 DIAGNOSIS — I10 Essential (primary) hypertension: Secondary | ICD-10-CM | POA: Diagnosis not present

## 2023-12-09 DIAGNOSIS — R32 Unspecified urinary incontinence: Secondary | ICD-10-CM | POA: Diagnosis not present

## 2023-12-09 DIAGNOSIS — Z0000 Encounter for general adult medical examination without abnormal findings: Secondary | ICD-10-CM | POA: Diagnosis not present

## 2024-01-08 ENCOUNTER — Other Ambulatory Visit (INDEPENDENT_AMBULATORY_CARE_PROVIDER_SITE_OTHER): Payer: Self-pay | Admitting: Gastroenterology

## 2024-01-09 ENCOUNTER — Encounter (INDEPENDENT_AMBULATORY_CARE_PROVIDER_SITE_OTHER): Payer: Medicare Other | Admitting: Ophthalmology

## 2024-01-09 DIAGNOSIS — H35033 Hypertensive retinopathy, bilateral: Secondary | ICD-10-CM

## 2024-01-09 DIAGNOSIS — I10 Essential (primary) hypertension: Secondary | ICD-10-CM

## 2024-01-09 DIAGNOSIS — H353132 Nonexudative age-related macular degeneration, bilateral, intermediate dry stage: Secondary | ICD-10-CM

## 2024-01-09 DIAGNOSIS — H34811 Central retinal vein occlusion, right eye, with macular edema: Secondary | ICD-10-CM

## 2024-01-09 DIAGNOSIS — Z961 Presence of intraocular lens: Secondary | ICD-10-CM

## 2024-01-09 DIAGNOSIS — H04123 Dry eye syndrome of bilateral lacrimal glands: Secondary | ICD-10-CM

## 2024-01-14 ENCOUNTER — Encounter (INDEPENDENT_AMBULATORY_CARE_PROVIDER_SITE_OTHER): Admitting: Ophthalmology

## 2024-01-14 DIAGNOSIS — H353132 Nonexudative age-related macular degeneration, bilateral, intermediate dry stage: Secondary | ICD-10-CM

## 2024-01-14 DIAGNOSIS — I10 Essential (primary) hypertension: Secondary | ICD-10-CM

## 2024-01-14 DIAGNOSIS — H34811 Central retinal vein occlusion, right eye, with macular edema: Secondary | ICD-10-CM

## 2024-01-14 DIAGNOSIS — H43391 Other vitreous opacities, right eye: Secondary | ICD-10-CM

## 2024-01-14 DIAGNOSIS — H04123 Dry eye syndrome of bilateral lacrimal glands: Secondary | ICD-10-CM

## 2024-01-14 DIAGNOSIS — H43811 Vitreous degeneration, right eye: Secondary | ICD-10-CM

## 2024-01-14 DIAGNOSIS — H35033 Hypertensive retinopathy, bilateral: Secondary | ICD-10-CM

## 2024-01-14 DIAGNOSIS — Z961 Presence of intraocular lens: Secondary | ICD-10-CM

## 2024-01-21 ENCOUNTER — Encounter (INDEPENDENT_AMBULATORY_CARE_PROVIDER_SITE_OTHER): Admitting: Ophthalmology

## 2024-01-21 DIAGNOSIS — H353132 Nonexudative age-related macular degeneration, bilateral, intermediate dry stage: Secondary | ICD-10-CM

## 2024-01-21 DIAGNOSIS — I10 Essential (primary) hypertension: Secondary | ICD-10-CM

## 2024-01-21 DIAGNOSIS — H34811 Central retinal vein occlusion, right eye, with macular edema: Secondary | ICD-10-CM

## 2024-01-21 DIAGNOSIS — H35033 Hypertensive retinopathy, bilateral: Secondary | ICD-10-CM

## 2024-01-21 DIAGNOSIS — Z961 Presence of intraocular lens: Secondary | ICD-10-CM

## 2024-01-21 DIAGNOSIS — H04123 Dry eye syndrome of bilateral lacrimal glands: Secondary | ICD-10-CM

## 2024-01-22 NOTE — Progress Notes (Signed)
 [facility] - Clinic Note  01/27/2024     CHIEF COMPLAINT Patient presents for Retina Follow Up   HISTORY OF PRESENT ILLNESS: Brenda Reynolds is a 89 y.o. female who presents to the clinic today for:   HPI     Retina Follow Up   Patient presents with  CRVO/BRVO.  In right eye.  This started 4 years ago.  Duration of 9.5 weeks.  I, the attending physician,  performed the HPI with the patient and updated documentation appropriately.        Comments   Patient presents for 6 week retina follow up (3.5 wks late), CRVO OD. Patient states vision has been stable. Pt states when she is reading she will look back at a word and it will not be what she had read before. Pt denies FOL/floaters/discomfort. Pt is using ats qid.      Last edited by Ronelle Coffee, MD on 01/27/2024  5:01 PM.     Referring physician: Veda Gerald, MD [address] [place],  [place] [address]  HISTORICAL INFORMATION:   Selected notes from the MEDICAL RECORD NUMBER Referred by Dr. Seward Dao due to insurance network LEE:  Ocular Hx- PMH-    CURRENT MEDICATIONS: No current outpatient medications on file. (Ophthalmic Drugs)   No current facility-administered medications for this visit. (Ophthalmic Drugs)   Current Outpatient Medications (Other)  Medication Sig   acetaminophen  (TYLENOL ) 500 MG tablet Take 500-1,000 mg by mouth every 6 (six) hours as needed for moderate pain.   alendronate (FOSAMAX) 70 MG tablet Take 70 mg by mouth every Tuesday.   calcium-vitamin D  (OSCAL WITH D) 500-200 MG-UNIT tablet Take 1 tablet by mouth 3 (three) times daily.   colestipol  (COLESTID ) 1 g tablet Take 1 tablet (1 g total) by mouth daily.   diclofenac Sodium (VOLTAREN) 1 % GEL Apply 2 g topically 4 (four) times daily.   famotidine  (PEPCID ) 40 MG tablet Take 1 tablet (40 mg total) by mouth every evening.   folic acid  (FOLVITE ) 400 MCG tablet Take 400 mcg by mouth daily.   gabapentin  (NEURONTIN ) 300 MG  capsule Take 300 mg by mouth 3 (three) times daily.   glucosamine-chondroitin 500-400 MG tablet Take 1 tablet by mouth daily.   HYDROcodone -acetaminophen  (NORCO/VICODIN) 5-325 MG tablet Take 1 tablet by mouth 2 (two) times daily as needed for moderate pain.   losartan  (COZAAR ) 100 MG tablet Take 100 mg by mouth daily.   Multiple Vitamin (MULTIVITAMIN) tablet Take 1 tablet by mouth daily.   Omega-3 Fatty Acids (FISH OIL) 1000 MG CAPS Take 2,000 mg by mouth daily.   pantoprazole  (PROTONIX ) 40 MG tablet TAKE 1 TABLET BY MOUTH DAILY   vitamin B-12 (CYANOCOBALAMIN ) 500 MCG tablet Take 500 mcg by mouth daily.   vitamin C (ASCORBIC ACID) 500 MG tablet Take 1 tablet (500 mg total) by mouth daily.   No current facility-administered medications for this visit. (Other)   REVIEW OF SYSTEMS: ROS   Positive for: Gastrointestinal, Cardiovascular, Eyes Negative for: Constitutional, Neurological, Skin, Genitourinary, Musculoskeletal, HENT, Endocrine, Respiratory, Psychiatric, Allergic/Imm, Heme/Lymph Last edited by Carrington Clack, COT on 01/27/2024  1:36 PM.      ALLERGIES Allergies  Allergen Reactions   Lidocaine  Rash    Patch   PAST MEDICAL HISTORY Past Medical History:  Diagnosis Date   GERD (gastroesophageal reflux disease)    Hypertension    Intertrochanteric fracture of left femur (HCC)    Osteoporosis  Scoliosis    Past Surgical History:  Procedure Laterality Date   BIOPSY  03/09/2021   Procedure: BIOPSY;  Surgeon: Urban Garden, MD;  Location: [facility];  Service: Gastroenterology;;   CATARACT EXTRACTION     COLONOSCOPY     Per patient this was done by Dr. Alline Ivans   COLONOSCOPY WITH PROPOFOL  N/A 03/09/2021   Procedure: COLONOSCOPY WITH PROPOFOL ;  Surgeon: Urban Garden, MD;  Location: [facility];  Service: Gastroenterology;  Laterality: N/A;  10:50   DILATION AND CURETTAGE OF UTERUS     ESOPHAGEAL DILATION N/A 03/09/2021   Procedure: ESOPHAGEAL  DILATION;  Surgeon: Urban Garden, MD;  Location: [facility];  Service: Gastroenterology;  Laterality: N/A;   ESOPHAGEAL DILATION  10/14/2023   Procedure: ESOPHAGEAL DILATION;  Surgeon: Umberto Ganong, Bearl Limes, MD;  Location: [facility];  Service: Gastroenterology;;   ESOPHAGOGASTRODUODENOSCOPY (EGD) WITH PROPOFOL  N/A 03/09/2021   Procedure: ESOPHAGOGASTRODUODENOSCOPY (EGD) WITH PROPOFOL ;  Surgeon: Urban Garden, MD;  [facility]: [facility];  Service: Gastroenterology;  Laterality: N/A;   ESOPHAGOGASTRODUODENOSCOPY (EGD) WITH PROPOFOL  N/A 10/14/2023   Procedure: ESOPHAGOGASTRODUODENOSCOPY (EGD) WITH PROPOFOL ;  Surgeon: Urban Garden, MD;  Location: [facility];  Service: Gastroenterology;  Laterality: N/A;  1:30PM;ASA 3   EYE SURGERY     Bilateral cataracts   INTRAMEDULLARY (IM) NAIL INTERTROCHANTERIC Left 07/04/2019   Procedure: INTRAMEDULLARY (IM) NAIL INTERTROCHANTRIC;  Surgeon: Adonica Hoose, MD;  Location: [facility];  Service: Orthopedics;  Laterality: Left;   TONSILLECTOMY     UPPER GASTROINTESTINAL ENDOSCOPY     Done by Dr. Perri Brake   FAMILY HISTORY Family History  Problem Relation Age of Onset   Cancer Mother    SOCIAL HISTORY Social History   Tobacco Use   Smoking status: Former    Passive exposure: Past   Smokeless tobacco: Never  Vaping Use   Vaping status: Never Used  Substance Use Topics   Alcohol use: Yes    Alcohol/week: 1.0 standard drink of alcohol    Types: 1 Glasses of wine per week   Drug use: Not Currently       OPHTHALMIC EXAM:  Base Eye Exam     Visual Acuity (Snellen - Linear)       Right Left   Dist cc 20/100 -2 20/25 -2   Dist ph cc 20/80 -2     Correction: Glasses         Tonometry (Tonopen, 1:58 PM)       Right Left   Pressure 15 16         Pupils       Pupils Dark Light Shape React APD   Right PERRL 3 2 Round Brisk None   Left PERRL 3 2 Round Brisk None         Visual Fields        Left Right    Full Full         Extraocular Movement       Right Left    Full, Ortho Full, Ortho         Neuro/Psych     Oriented x3: Yes   Mood/Affect: Normal         Dilation     Both eyes: 1.0% Mydriacyl, 2.5% Phenylephrine  @ 2:00 PM           Slit Lamp and Fundus Exam     External Exam       Right Left   External Normal Normal  Slit Lamp Exam       Right Left   Lids/Lashes Dermatochalasis - upper lid, Meibomian gland dysfunction Dermatochalasis - upper lid, Meibomian gland dysfunction   Conjunctiva/Sclera White and quiet White and quiet   Cornea arcus, 2+ Punctate epithelial erosions, Descemet's folds, tear film debris arcus, 1+ Punctate epithelial erosions, trace Descemet's folds, tear film debris   Anterior Chamber deep and clear deep and clear   Iris Round and dilated Round and dilated   Lens 3 piece PC IOL in good position with open PC 3 piece PC IOL in good position with open PC   Anterior Vitreous Vitreous syneresis, vitreous condensations, Posterior vitreous detachment Posterior vitreous detachment, vitreous condensations         Fundus Exam       Right Left   Disc Pink and Sharp, +hyperemia, vascular loops mild Pallor, Sharp rim, central hyperemia, mild PPA   C/D Ratio 0.3 0.2   Macula Blunted foveal reflex, interval increase in cystic change / edema, scattered IRH, +drusen Flat, Good foveal reflex, RPE mottling and clumping, Drusen, No heme or edema   Vessels attenuated, Tortuous, old CRVO attenuated, Tortuous   Periphery Attached, mild reticular degeneration Attached, mild reticular degeneration, No heme           Refraction     Wearing Rx       Sphere Cylinder Axis Add   Right -0.75 +2.00 178 +2.50   Left -0.50 +2.00 170 +2.50         Manifest Refraction (Subjective)       Sphere Cylinder Axis Dist VA   Right -1.00 +2.75 175 80-2   Left               IMAGING AND PROCEDURES  Imaging and Procedures for  01/27/2024  OCT, Retina - OU - Both Eyes       Right Eye Quality was good. Scan locations included subfoveal. Central Foveal Thickness: 532. Progression has worsened. Findings include no SRF, abnormal foveal contour, intraretinal fluid, outer retinal atrophy (Interval increase in IRF temporal fovea and macula; SRF stably improved).   Left Eye Quality was good. Scan locations included subfoveal. Central Foveal Thickness: 260. Progression has been stable. Findings include normal foveal contour, no IRF, no SRF, retinal drusen .   Notes *Images captured and stored on drive  Diagnosis / Impression:  OD: Interval increase in IRF temporal fovea and macula; SRF stably improved OS: NFP, no IRF/SRF  Clinical management:  See below  Abbreviations: NFP - Normal foveal profile. CME - cystoid macular edema. PED - pigment epithelial detachment. IRF - intraretinal fluid. SRF - subretinal fluid. EZ - ellipsoid zone. ERM - epiretinal membrane. ORA - outer retinal atrophy. ORT - outer retinal tubulation. SRHM - subretinal hyper-reflective material. IRHM - intraretinal hyper-reflective material      Intravitreal Injection, Pharmacologic Agent - OD - Right Eye       Time Out 01/27/2024. 3:28 PM. Confirmed correct patient, procedure, site, and patient consented.   Anesthesia Topical anesthesia was used. Anesthetic medications included Lidocaine  2%, Proparacaine 0.5%.   Procedure Preparation included 5% betadine  to ocular surface, eyelid speculum. A supplied needle was used.   Injection: 1.25 mg Bevacizumab  1.25mg /0.89ml   Route: Intravitreal, Site: Right Eye   NDC: [id number], Lot: [id number], Expiration date: 02/26/2024   Post-op Post injection exam found visual acuity of at least counting fingers. The patient tolerated the procedure well. There were no complications. The patient received written and verbal post procedure  care education.            ASSESSMENT/PLAN:    ICD-10-CM   1. 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 Intermediate stage nonexudative age-related macular degeneration of both eyes  H35.3132     3. Essential hypertension  I10     4. Hypertensive retinopathy of both eyes  H35.033     5. Pseudophakia of both eyes  Z96.1     6. Dry eyes  H04.123     7. Vitreous floaters of right eye  H43.391     8. Posterior vitreous detachment of right eye  H43.811      CRVO w/ CME, OD **delayed f/u -- 8+ wks instead of 6 due to colitis and transportation on 04.29.25**  - previous Dr. Seward Dao pt here for insurance reasons  - history of IVA OD x13 (last one 09.14.23 -- 14 wks) - s/p IVA OD #14 (12.19.23), #15 (01.30.24), #16 (03.12.24), #17 (04.23.24), #18 (01.17.25), #19 (02.27.25) - s/p IVE OD #1 (06.04.24), #2 (07.16.24), #3 (08.28.24), #4 (10.15.24), #5 (11.26.24) - BCVA decreased to 20/80 from 20/50  - OCT shows OD: Interval increase in IRF temporal fovea and macula; SRF stably improved; OS: NFP, no IRF/SRF at 8+ weeks  - recommend IVA OD #20 today 04.29.25 with follow up back to 4-5 weeks  - RBA of procedure discussed, questions answered - IVA informed consent obtained and signed 01.17.25  - see procedure note - Eylea  approved for 2025 -- but funding for Good Days unavailable - f/u 4-5 wks -- DFE/OCT, possible injection  2. Age related macular degeneration, non-exudative, OU  - mild-intermediate stage - The incidence, anatomy, and pathology of dry AMD, risk of progression, and the AREDS and AREDS 2 studies including smoking risks discussed with patient.  - recommend Amsler grid monitoring  3,4. Hypertensive retinopathy OU - discussed importance of tight BP control - continue to monitor  5. Pseudophakia OU  - s/p CE/IOL OU  - IOLs in good position, doing well  - continue to monitor  6.  Dry eyes OU - recommend artifical tears and lubricating ointment as needed  Ophthalmic Meds Ordered this visit:  Meds ordered this encounter  Medications   Bevacizumab  (AVASTIN ) SOLN 1.25 mg     Return for f/u 4-5 weeks, CRVO OD, DFE, OCT.  There are no Patient Instructions on file for this visit.  This document serves as a record of services personally performed by Jeanice Millard, MD, PhD. It was created on their behalf by Olene Berne, COT an ophthalmic technician. The creation of this record is the provider's dictation and/or activities during the visit.    Electronically signed by:  Olene Berne, COT  01/27/24 5:03 PM  This document serves as a record of services personally performed by Jeanice Millard, MD, PhD. It was created on their behalf by Morley Arabia. Bevin Bucks, OA an ophthalmic technician. The creation of this record is the provider's dictation and/or activities during the visit.    Electronically signed by: Morley Arabia. Bevin Bucks, OA 01/27/24 5:03 PM  Jeanice Millard, M.D., Ph.D. Diseases & Surgery of the Retina and Vitreous [facility] 01/27/2024   I have reviewed the above documentation for accuracy and completeness, and I agree with  the above. Jeanice Millard, M.D., Ph.D. 01/27/24 5:05 PM   Abbreviations: M myopia (nearsighted); A astigmatism; H hyperopia (farsighted); P presbyopia; Mrx spectacle prescription;  CTL contact lenses; OD right eye; OS left eye; OU both eyes  XT exotropia; ET esotropia; PEK punctate epithelial keratitis; PEE punctate epithelial erosions; DES dry eye syndrome; MGD meibomian gland dysfunction; ATs artificial tears; PFAT's preservative free artificial tears; NSC nuclear sclerotic cataract; PSC posterior subcapsular cataract; ERM epi-retinal membrane; PVD posterior vitreous detachment; RD retinal detachment; DM diabetes mellitus; DR diabetic retinopathy; NPDR non-proliferative diabetic retinopathy; PDR proliferative  diabetic retinopathy; CSME clinically significant macular edema; DME diabetic macular edema; dbh dot blot hemorrhages; CWS cotton wool spot; POAG primary open angle glaucoma; C/D cup-to-disc ratio; HVF humphrey visual field; GVF goldmann visual field; OCT optical coherence tomography; IOP intraocular pressure; BRVO Branch retinal vein occlusion; CRVO central retinal vein occlusion; CRAO central retinal artery occlusion; BRAO branch retinal artery occlusion; RT retinal tear; SB scleral buckle; PPV pars plana vitrectomy; VH Vitreous hemorrhage; PRP panretinal laser photocoagulation; IVK intravitreal kenalog; VMT vitreomacular traction; MH Macular hole;  NVD neovascularization of the disc; NVE neovascularization elsewhere; AREDS age related eye disease study; ARMD age related macular degeneration; POAG primary open angle glaucoma; EBMD epithelial/anterior basement membrane dystrophy; ACIOL anterior chamber intraocular lens; IOL intraocular lens; PCIOL posterior chamber intraocular lens; Phaco/IOL phacoemulsification with intraocular lens placement; PRK photorefractive keratectomy; LASIK laser assisted in situ keratomileusis; HTN hypertension; DM diabetes mellitus; COPD chronic obstructive pulmonary disease

## 2024-01-27 ENCOUNTER — Encounter (INDEPENDENT_AMBULATORY_CARE_PROVIDER_SITE_OTHER): Payer: Self-pay | Admitting: Ophthalmology

## 2024-01-27 ENCOUNTER — Ambulatory Visit (INDEPENDENT_AMBULATORY_CARE_PROVIDER_SITE_OTHER): Admitting: Ophthalmology

## 2024-01-27 DIAGNOSIS — H353132 Nonexudative age-related macular degeneration, bilateral, intermediate dry stage: Secondary | ICD-10-CM | POA: Diagnosis not present

## 2024-01-27 DIAGNOSIS — H35033 Hypertensive retinopathy, bilateral: Secondary | ICD-10-CM

## 2024-01-27 DIAGNOSIS — H43391 Other vitreous opacities, right eye: Secondary | ICD-10-CM

## 2024-01-27 DIAGNOSIS — H34811 Central retinal vein occlusion, right eye, with macular edema: Secondary | ICD-10-CM | POA: Diagnosis not present

## 2024-01-27 DIAGNOSIS — H43811 Vitreous degeneration, right eye: Secondary | ICD-10-CM

## 2024-01-27 DIAGNOSIS — Z961 Presence of intraocular lens: Secondary | ICD-10-CM

## 2024-01-27 DIAGNOSIS — I10 Essential (primary) hypertension: Secondary | ICD-10-CM

## 2024-01-27 DIAGNOSIS — H04123 Dry eye syndrome of bilateral lacrimal glands: Secondary | ICD-10-CM

## 2024-01-27 MED ORDER — BEVACIZUMAB CHEMO INJECTION 1.25MG/0.05ML SYRINGE FOR KALEIDOSCOPE
1.2500 mg | INTRAVITREAL | Status: AC | PRN
  Administered 2024-01-27: 1.25 mg via INTRAVITREAL

## 2024-02-19 NOTE — Progress Notes (Signed)
 [facility] - Clinic Note  03/02/2024     CHIEF COMPLAINT Patient presents for Retina Follow Up   HISTORY OF PRESENT ILLNESS: Brenda Reynolds is a 89 y.o. female who presents to the clinic today for:   HPI     Retina Follow Up   Patient presents with  CRVO/BRVO.  In right eye.  This started 5 weeks ago.  I, the attending physician,  performed the HPI with the patient and updated documentation appropriately.        Comments   Patient here for 5 weeks retina follow up for CRVO OD. Patient states vision about the same. No eye pain.       Last edited by Ronelle Coffee, MD on 03/02/2024  5:10 PM.    Pt feels like her vision has improved  Referring physician: Veda Gerald, MD [address] [place],  [place] [address]  HISTORICAL INFORMATION:   Selected notes from the MEDICAL RECORD NUMBER Referred by Dr. Seward Dao due to insurance network LEE:  Ocular Hx- PMH-    CURRENT MEDICATIONS: No current outpatient medications on file. (Ophthalmic Drugs)   No current facility-administered medications for this visit. (Ophthalmic Drugs)   Current Outpatient Medications (Other)  Medication Sig   acetaminophen  (TYLENOL ) 500 MG tablet Take 500-1,000 mg by mouth every 6 (six) hours as needed for moderate pain.   alendronate (FOSAMAX) 70 MG tablet Take 70 mg by mouth every Tuesday.   calcium-vitamin D  (OSCAL WITH D) 500-200 MG-UNIT tablet Take 1 tablet by mouth 3 (three) times daily.   colestipol  (COLESTID ) 1 g tablet Take 1 tablet (1 g total) by mouth daily.   diclofenac Sodium (VOLTAREN) 1 % GEL Apply 2 g topically 4 (four) times daily.   famotidine  (PEPCID ) 40 MG tablet Take 1 tablet (40 mg total) by mouth every evening.   folic acid  (FOLVITE ) 400 MCG tablet Take 400 mcg by mouth daily.   gabapentin  (NEURONTIN ) 300 MG capsule Take 300 mg by mouth 3 (three) times daily.   glucosamine-chondroitin 500-400 MG tablet Take 1 tablet by mouth daily.    HYDROcodone -acetaminophen  (NORCO/VICODIN) 5-325 MG tablet Take 1 tablet by mouth 2 (two) times daily as needed for moderate pain.   losartan  (COZAAR ) 100 MG tablet Take 100 mg by mouth daily.   Multiple Vitamin (MULTIVITAMIN) tablet Take 1 tablet by mouth daily.   Omega-3 Fatty Acids (FISH OIL) 1000 MG CAPS Take 2,000 mg by mouth daily.   pantoprazole  (PROTONIX ) 40 MG tablet TAKE 1 TABLET BY MOUTH DAILY   vitamin B-12 (CYANOCOBALAMIN ) 500 MCG tablet Take 500 mcg by mouth daily.   vitamin C (ASCORBIC ACID) 500 MG tablet Take 1 tablet (500 mg total) by mouth daily.   No current facility-administered medications for this visit. (Other)   REVIEW OF SYSTEMS: ROS   Positive for: Gastrointestinal, Cardiovascular, Eyes Negative for: Constitutional, Neurological, Skin, Genitourinary, Musculoskeletal, HENT, Endocrine, Respiratory, Psychiatric, Allergic/Imm, Heme/Lymph Last edited by Sylvan Evener, COA on 03/02/2024  1:57 PM.       ALLERGIES Allergies  Allergen Reactions   Lidocaine  Rash    Patch   PAST MEDICAL HISTORY Past Medical History:  Diagnosis Date   GERD (gastroesophageal reflux disease)    Hypertension    Intertrochanteric fracture of left femur (HCC)    Osteoporosis    Scoliosis    Past Surgical History:  Procedure Laterality Date   BIOPSY  03/09/2021   Procedure: BIOPSY;  Surgeon: Urban Garden, MD;  Location: [facility];  Service: Gastroenterology;;   CATARACT EXTRACTION     COLONOSCOPY     Per patient this was done by Dr. Alline Ivans   COLONOSCOPY WITH PROPOFOL  N/A 03/09/2021   Procedure: COLONOSCOPY WITH PROPOFOL ;  Surgeon: Urban Garden, MD;  Location: [facility];  Service: Gastroenterology;  Laterality: N/A;  10:50   DILATION AND CURETTAGE OF UTERUS     ESOPHAGEAL DILATION N/A 03/09/2021   Procedure: ESOPHAGEAL DILATION;  Surgeon: Urban Garden, MD;  Location: [facility];  Service: Gastroenterology;  Laterality: N/A;    ESOPHAGEAL DILATION  10/14/2023   Procedure: ESOPHAGEAL DILATION;  Surgeon: Umberto Ganong, Bearl Limes, MD;  Location: [facility];  Service: Gastroenterology;;   ESOPHAGOGASTRODUODENOSCOPY (EGD) WITH PROPOFOL  N/A 03/09/2021   Procedure: ESOPHAGOGASTRODUODENOSCOPY (EGD) WITH PROPOFOL ;  Surgeon: Urban Garden, MD;  Location: [facility];  Service: Gastroenterology;  Laterality: N/A;   ESOPHAGOGASTRODUODENOSCOPY (EGD) WITH PROPOFOL  N/A 10/14/2023   Procedure: ESOPHAGOGASTRODUODENOSCOPY (EGD) WITH PROPOFOL ;  Surgeon: Urban Garden, MD;  Location: [facility];  Service: Gastroenterology;  Laterality: N/A;  1:30PM;ASA 3   EYE SURGERY     Bilateral cataracts   INTRAMEDULLARY (IM) NAIL INTERTROCHANTERIC Left 07/04/2019   Procedure: INTRAMEDULLARY (IM) NAIL INTERTROCHANTRIC;  Surgeon: Adonica Hoose, MD;  Location: [facility];  Service: Orthopedics;  Laterality: Left;   TONSILLECTOMY     UPPER GASTROINTESTINAL ENDOSCOPY     Done by Dr. Perri Brake   FAMILY HISTORY Family History  Problem Relation Age of Onset   Cancer Mother    SOCIAL HISTORY Social History   Tobacco Use   Smoking status: Former    Passive exposure: Past   Smokeless tobacco: Never  Vaping Use   Vaping status: Never Used  Substance Use Topics   Alcohol use: Yes    Alcohol/week: 1.0 standard drink of alcohol    Types: 1 Glasses of wine per week   Drug use: Not Currently       OPHTHALMIC EXAM:  Base Eye Exam     Visual Acuity (Snellen - Linear)       Right Left   Dist cc 20/70 -1 20/30 -2   Dist ph cc 20/60 -1 20/30 +2    Correction: Glasses         Tonometry (Tonopen, 1:55 PM)       Right Left   Pressure 17 16         Pupils       Dark Light Shape React APD   Right 3 2 Round Brisk None   Left 3 2 Round Brisk None         Visual Fields (Counting fingers)       Left Right    Full Full         Extraocular Movement       Right Left    Full, Ortho Full, Ortho          Neuro/Psych     Oriented x3: Yes   Mood/Affect: Normal         Dilation     Both eyes: 1.0% Mydriacyl, 2.5% Phenylephrine  @ 1:55 PM           Slit Lamp and Fundus Exam     External Exam       Right Left   External Normal Normal         Slit Lamp Exam       Right Left   Lids/Lashes Dermatochalasis - upper lid, Meibomian  gland dysfunction Dermatochalasis - upper lid, Meibomian gland dysfunction   Conjunctiva/Sclera White and quiet White and quiet   Cornea arcus, 2+ Punctate epithelial erosions, Descemet's folds, tear film debris arcus, 1+ Punctate epithelial erosions, trace Descemet's folds, tear film debris   Anterior Chamber deep and clear deep and clear   Iris Round and dilated Round and dilated   Lens 3 piece PC IOL in good position with open PC 3 piece PC IOL in good position with open PC   Anterior Vitreous Vitreous syneresis, vitreous condensations, Posterior vitreous detachment Posterior vitreous detachment, vitreous condensations         Fundus Exam       Right Left   Disc Pink and Sharp, +hyperemia, vascular loops mild Pallor, Sharp rim, central hyperemia, mild PPA   C/D Ratio 0.3 0.2   Macula Blunted foveal reflex, interval improvement in cystic change / edema, scattered IRH, +drusen, RPE atrophy Flat, Good foveal reflex, RPE mottling and clumping, Drusen, No heme or edema   Vessels attenuated, Tortuous, old CRVO attenuated, Tortuous   Periphery Attached, mild reticular degeneration, No heme Attached, mild reticular degeneration, No heme           Refraction     Wearing Rx       Sphere Cylinder Axis Add   Right -0.75 +2.00 178 +2.50   Left -0.50 +2.00 170 +2.50           IMAGING AND PROCEDURES  Imaging and Procedures for 03/02/2024  OCT, Retina - OU - Both Eyes        Right Eye Quality was good. Scan locations included subfoveal. Central Foveal Thickness: 223. Progression has improved. Findings include no SRF, abnormal foveal  contour, intraretinal fluid, outer retinal atrophy (Interval improvement in IRF temporal fovea and macula -- resolved; SRF stably improved).   Left Eye Quality was good. Scan locations included subfoveal. Central Foveal Thickness: 261. Progression has been stable. Findings include normal foveal contour, no IRF, no SRF, retinal drusen .   Notes  *Images captured and stored on drive  Diagnosis / Impression:  OD: Interval improvement in IRF temporal fovea and macula -- resolved; SRF stably improved OS: NFP, no IRF/SRF  Clinical management:  See below  Abbreviations: NFP - Normal foveal profile. CME - cystoid macular edema. PED - pigment epithelial detachment. IRF - intraretinal fluid. SRF - subretinal fluid. EZ - ellipsoid zone. ERM - epiretinal membrane. ORA - outer retinal atrophy. ORT - outer retinal tubulation. SRHM - subretinal hyper-reflective material. IRHM - intraretinal hyper-reflective material      Intravitreal Injection, Pharmacologic Agent - OD - Right Eye       Time Out 03/02/2024. 3:04 PM. Confirmed correct patient, procedure, site, and patient consented.   Anesthesia Topical anesthesia was used. Anesthetic medications included Lidocaine  2%, Proparacaine 0.5%.   Procedure Preparation included 5% betadine  to ocular surface, eyelid speculum. A supplied needle was used.   Injection: 1.25 mg Bevacizumab  1.25mg /0.49ml   Route: Intravitreal, Site: Right Eye   NDC: [id number], Lot: [id number], Expiration date: 04/09/2024   Post-op Post injection exam found visual acuity of at least counting fingers. The patient tolerated the procedure well. There were no complications. The patient received written and verbal post procedure care education.             ASSESSMENT/PLAN:    ICD-10-CM   1. Central retinal vein occlusion with macular edema of right eye  H34.8110 OCT, Retina - OU - Both Eyes    Intravitreal  Injection, Pharmacologic Agent - OD - Right Eye    Bevacizumab   (AVASTIN ) SOLN 1.25 mg    2. Intermediate stage nonexudative age-related macular degeneration of both eyes  H35.3132     3. Essential hypertension  I10     4. Hypertensive retinopathy of both eyes  H35.033     5. Pseudophakia of both eyes  Z96.1     6. Dry eyes  H04.123      CRVO w/ CME, OD **delayed f/u -- 8+ wks instead of 6 due to colitis and transportation on 04.29.25**  - previous Dr. Seward Dao pt here for insurance reasons  - history of IVA OD x13 (last one 09.14.23 -- 14 wks) - s/p IVA OD #14 (12.19.23), #15 (01.30.24), #16 (03.12.24), #17 (04.23.24), #18 (01.17.25), #19 (02.27.25), #20 (04.29.25) - s/p IVE OD #1 (06.04.24), #2 (07.16.24), #3 (08.28.24), #4 (10.15.24), #5 (11.26.24) **h/o increased IRF/edema at 8+ wks on 04.29.25** - BCVA improved to 20/60 from 20/80  - OCT shows OD: Interval improvement in IRF temporal fovea and macula; SRF stably improved; OS: NFP, no IRF/SRF at 5 weeks  - recommend IVA OD #21 today 06.03.25 with follow up extended to 6 weeks  - RBA of procedure discussed, questions answered - IVA informed consent obtained and signed 01.17.25  - see procedure note - Eylea  approved for 2025 -- but funding for Good Days unavailable - f/u 6 wks -- DFE/OCT, possible injection  2. Age related macular degeneration, non-exudative, OU  - mild-intermediate stage - The incidence, anatomy, and pathology of dry AMD, risk of progression, and the AREDS and AREDS 2 studies including smoking risks discussed with patient.  - recommend Amsler grid monitoring  3,4. Hypertensive retinopathy OU - discussed importance of tight BP control - continue to monitor  5. Pseudophakia OU  - s/p CE/IOL OU  - IOLs in good position, doing well  - continue to monitor  6. Dry eyes OU - recommend artifical tears and lubricating ointment as needed  Ophthalmic Meds Ordered this visit:  Meds ordered this encounter  Medications   Bevacizumab  (AVASTIN ) SOLN 1.25 mg     Return in  about 6 weeks (around 04/13/2024) for f/u CRVO OD, DFE, OCT, Possible Injxn.  There are no Patient Instructions on file for this visit.  This document serves as a record of services personally performed by Jeanice Millard, MD, PhD. It was created on their behalf by Olene Berne, COT an ophthalmic technician. The creation of this record is the provider's dictation and/or activities during the visit.    Electronically signed by:  Olene Berne, COT  03/02/24 5:11 PM  Jeanice Millard, M.D., Ph.D. Diseases & Surgery of the Retina and Vitreous [facility]  I have reviewed the above documentation for accuracy and completeness, and I agree with the above. Jeanice Millard, M.D., Ph.D. 03/02/24 5:12 PM   Abbreviations: M myopia (nearsighted); A astigmatism; H hyperopia (farsighted); P presbyopia; Mrx spectacle prescription;  CTL contact lenses; OD right eye; OS left eye; OU both eyes  XT exotropia; ET esotropia; PEK punctate epithelial keratitis; PEE punctate epithelial erosions; DES dry eye syndrome; MGD meibomian gland dysfunction; ATs artificial tears; PFAT's preservative free artificial tears; NSC nuclear sclerotic cataract; PSC posterior subcapsular cataract; ERM epi-retinal membrane; PVD posterior vitreous detachment; RD retinal detachment; DM diabetes mellitus; DR diabetic retinopathy; NPDR non-proliferative diabetic retinopathy; PDR proliferative diabetic retinopathy; CSME clinically significant macular edema; DME diabetic macular edema; dbh dot blot hemorrhages;  CWS cotton wool spot; POAG primary open angle glaucoma; C/D cup-to-disc ratio; HVF humphrey visual field; GVF goldmann visual field; OCT optical coherence tomography; IOP intraocular pressure; BRVO Branch retinal vein occlusion; CRVO central retinal vein occlusion; CRAO central retinal artery occlusion; BRAO branch retinal artery occlusion; RT retinal tear; SB scleral buckle; PPV pars plana vitrectomy; VH  Vitreous hemorrhage; PRP panretinal laser photocoagulation; IVK intravitreal kenalog; VMT vitreomacular traction; MH Macular hole;  NVD neovascularization of the disc; NVE neovascularization elsewhere; AREDS age related eye disease study; ARMD age related macular degeneration; POAG primary open angle glaucoma; EBMD epithelial/anterior basement membrane dystrophy; ACIOL anterior chamber intraocular lens; IOL intraocular lens; PCIOL posterior chamber intraocular lens; Phaco/IOL phacoemulsification with intraocular lens placement; PRK photorefractive keratectomy; LASIK laser assisted in situ keratomileusis; HTN hypertension; DM diabetes mellitus; COPD chronic obstructive pulmonary disease

## 2024-03-02 ENCOUNTER — Ambulatory Visit (INDEPENDENT_AMBULATORY_CARE_PROVIDER_SITE_OTHER): Admitting: Ophthalmology

## 2024-03-02 ENCOUNTER — Encounter (INDEPENDENT_AMBULATORY_CARE_PROVIDER_SITE_OTHER): Payer: Self-pay | Admitting: Ophthalmology

## 2024-03-02 DIAGNOSIS — H04123 Dry eye syndrome of bilateral lacrimal glands: Secondary | ICD-10-CM

## 2024-03-02 DIAGNOSIS — Z961 Presence of intraocular lens: Secondary | ICD-10-CM

## 2024-03-02 DIAGNOSIS — I10 Essential (primary) hypertension: Secondary | ICD-10-CM | POA: Diagnosis not present

## 2024-03-02 DIAGNOSIS — H353132 Nonexudative age-related macular degeneration, bilateral, intermediate dry stage: Secondary | ICD-10-CM

## 2024-03-02 DIAGNOSIS — H35033 Hypertensive retinopathy, bilateral: Secondary | ICD-10-CM

## 2024-03-02 DIAGNOSIS — H34811 Central retinal vein occlusion, right eye, with macular edema: Secondary | ICD-10-CM | POA: Diagnosis not present

## 2024-03-02 MED ORDER — BEVACIZUMAB CHEMO INJECTION 1.25MG/0.05ML SYRINGE FOR KALEIDOSCOPE
1.2500 mg | INTRAVITREAL | Status: AC | PRN
Start: 2024-03-02 — End: 2024-03-02
  Administered 2024-03-02: 1.25 mg via INTRAVITREAL

## 2024-03-09 DIAGNOSIS — Z0000 Encounter for general adult medical examination without abnormal findings: Secondary | ICD-10-CM | POA: Diagnosis not present

## 2024-03-09 DIAGNOSIS — J449 Chronic obstructive pulmonary disease, unspecified: Secondary | ICD-10-CM | POA: Diagnosis not present

## 2024-03-09 DIAGNOSIS — M818 Other osteoporosis without current pathological fracture: Secondary | ICD-10-CM | POA: Diagnosis not present

## 2024-03-09 DIAGNOSIS — I10 Essential (primary) hypertension: Secondary | ICD-10-CM | POA: Diagnosis not present

## 2024-03-09 DIAGNOSIS — K2100 Gastro-esophageal reflux disease with esophagitis, without bleeding: Secondary | ICD-10-CM | POA: Diagnosis not present

## 2024-03-09 DIAGNOSIS — R0602 Shortness of breath: Secondary | ICD-10-CM | POA: Diagnosis not present

## 2024-04-09 NOTE — Progress Notes (Signed)
 [facility] - Clinic Note  04/13/2024     CHIEF COMPLAINT Patient presents for Retina Follow Up   HISTORY OF PRESENT ILLNESS: Brenda Reynolds is a 89 y.o. female who presents to the clinic today for:   HPI     Retina Follow Up   Patient presents with  CRVO/BRVO.  In right eye.  This started 6 weeks ago.  Duration of 6 weeks.  Since onset it is stable.  I, the attending physician,  performed the HPI with the patient and updated documentation appropriately.        Comments   6 week retina follow up CRVO OD and IVA OD pt is reporting no vision changes noticed she denies any flashes has some floaters       Last edited by Valdemar Rogue, MD on 04/13/2024  8:25 PM.     Pt feels like her vision has improved  Referring physician: Orpha Yancey LABOR, MD [address] [place],  [place] [address]  HISTORICAL INFORMATION:   Selected notes from the MEDICAL RECORD NUMBER Referred by Dr. Elner due to insurance network LEE:  Ocular Hx- PMH-    CURRENT MEDICATIONS: No current outpatient medications on file. (Ophthalmic Drugs)   No current facility-administered medications for this visit. (Ophthalmic Drugs)   Current Outpatient Medications (Other)  Medication Sig   acetaminophen  (TYLENOL ) 500 MG tablet Take 500-1,000 mg by mouth every 6 (six) hours as needed for moderate pain.   alendronate (FOSAMAX) 70 MG tablet Take 70 mg by mouth every Tuesday.   calcium-vitamin D  (OSCAL WITH D) 500-200 MG-UNIT tablet Take 1 tablet by mouth 3 (three) times daily.   colestipol  (COLESTID ) 1 g tablet Take 1 tablet (1 g total) by mouth daily.   diclofenac Sodium (VOLTAREN) 1 % GEL Apply 2 g topically 4 (four) times daily.   famotidine  (PEPCID ) 40 MG tablet Take 1 tablet (40 mg total) by mouth every evening.   folic acid  (FOLVITE ) 400 MCG tablet Take 400 mcg by mouth daily.   gabapentin  (NEURONTIN ) 300 MG capsule Take 300 mg by mouth 3 (three) times daily.   glucosamine-chondroitin  500-400 MG tablet Take 1 tablet by mouth daily.   HYDROcodone -acetaminophen  (NORCO/VICODIN) 5-325 MG tablet Take 1 tablet by mouth 2 (two) times daily as needed for moderate pain.   losartan  (COZAAR ) 100 MG tablet Take 100 mg by mouth daily.   Multiple Vitamin (MULTIVITAMIN) tablet Take 1 tablet by mouth daily.   Omega-3 Fatty Acids (FISH OIL) 1000 MG CAPS Take 2,000 mg by mouth daily.   pantoprazole  (PROTONIX ) 40 MG tablet TAKE 1 TABLET BY MOUTH DAILY   vitamin B-12 (CYANOCOBALAMIN ) 500 MCG tablet Take 500 mcg by mouth daily.   vitamin C (ASCORBIC ACID) 500 MG tablet Take 1 tablet (500 mg total) by mouth daily.   No current facility-administered medications for this visit. (Other)   REVIEW OF SYSTEMS: ROS   Positive for: Gastrointestinal, Cardiovascular, Eyes Negative for: Constitutional, Neurological, Skin, Genitourinary, Musculoskeletal, HENT, Endocrine, Respiratory, Psychiatric, Allergic/Imm, Heme/Lymph Last edited by Resa Delon ORN, COT on 04/13/2024  1:35 PM.     ALLERGIES Allergies  Allergen Reactions   Lidocaine  Rash    Patch   PAST MEDICAL HISTORY Past Medical History:  Diagnosis Date   GERD (gastroesophageal reflux disease)    Hypertension    Intertrochanteric fracture of left femur (HCC)    Osteoporosis    Scoliosis    Past Surgical History:  Procedure Laterality Date  BIOPSY  03/09/2021   Procedure: BIOPSY;  Surgeon: Eartha Angelia Sieving, MD;  Location: [facility];  Service: Gastroenterology;;   CATARACT EXTRACTION     COLONOSCOPY     Per patient this was done by Dr. Donnel   COLONOSCOPY WITH PROPOFOL  N/A 03/09/2021   Procedure: COLONOSCOPY WITH PROPOFOL ;  Surgeon: Eartha Angelia Sieving, MD;  Location: [facility];  Service: Gastroenterology;  Laterality: N/A;  10:50   DILATION AND CURETTAGE OF UTERUS     ESOPHAGEAL DILATION N/A 03/09/2021   Procedure: ESOPHAGEAL DILATION;  Surgeon: Eartha Angelia Sieving, MD;  Location: [facility];   Service: Gastroenterology;  Laterality: N/A;   ESOPHAGEAL DILATION  10/14/2023   Procedure: ESOPHAGEAL DILATION;  Surgeon: Eartha Angelia, Sieving, MD;  Location: [facility];  Service: Gastroenterology;;   ESOPHAGOGASTRODUODENOSCOPY (EGD) WITH PROPOFOL  N/A 03/09/2021   Procedure: ESOPHAGOGASTRODUODENOSCOPY (EGD) WITH PROPOFOL ;  Surgeon: Eartha Angelia Sieving, MD;  [facility]: [facility];  Service: Gastroenterology;  Laterality: N/A;   ESOPHAGOGASTRODUODENOSCOPY (EGD) WITH PROPOFOL  N/A 10/14/2023   Procedure: ESOPHAGOGASTRODUODENOSCOPY (EGD) WITH PROPOFOL ;  Surgeon: Eartha Angelia Sieving, MD;  Location: [facility];  Service: Gastroenterology;  Laterality: N/A;  1:30PM;ASA 3   EYE SURGERY     Bilateral cataracts   INTRAMEDULLARY (IM) NAIL INTERTROCHANTERIC Left 07/04/2019   Procedure: INTRAMEDULLARY (IM) NAIL INTERTROCHANTRIC;  Surgeon: Fidel Rogue, MD;  Location: [facility];  Service: Orthopedics;  Laterality: Left;   TONSILLECTOMY     UPPER GASTROINTESTINAL ENDOSCOPY     Done by Dr. Ivery   FAMILY HISTORY Family History  Problem Relation Age of Onset   Cancer Mother    SOCIAL HISTORY Social History   Tobacco Use   Smoking status: Former    Passive exposure: Past   Smokeless tobacco: Never  Vaping Use   Vaping status: Never Used  Substance Use Topics   Alcohol use: Yes    Alcohol/week: 1.0 standard drink of alcohol    Types: 1 Glasses of wine per week   Drug use: Not Currently       OPHTHALMIC EXAM:  Base Eye Exam     Visual Acuity (Snellen - Linear)       Right Left   Dist cc 20/70 -2 20/60 -2   Dist ph cc NI 20/30 -2         Tonometry (Tonopen, 1:41 PM)       Right Left   Pressure 16 16         Pupils       Pupils Dark Light Shape React APD   Right PERRL 3 2 Round Brisk None   Left PERRL 3 2 Round Brisk None         Visual Fields       Left Right    Full Full         Extraocular Movement       Right Left    Full, Ortho Full,  Ortho         Neuro/Psych     Oriented x3: Yes   Mood/Affect: Normal         Dilation     Both eyes: 2.5% Phenylephrine  @ 1:41 PM           Slit Lamp and Fundus Exam     External Exam       Right Left   External Normal Normal         Slit Lamp Exam       Right Left  Lids/Lashes Dermatochalasis - upper lid, Meibomian gland dysfunction Dermatochalasis - upper lid, Meibomian gland dysfunction   Conjunctiva/Sclera White and quiet White and quiet   Cornea arcus, 2+ Punctate epithelial erosions, Descemet's folds, tear film debris arcus, 1+ Punctate epithelial erosions, trace Descemet's folds, tear film debris   Anterior Chamber deep and clear deep and clear   Iris Round and dilated Round and dilated   Lens 3 piece PC IOL in good position with open PC 3 piece PC IOL in good position with open PC   Anterior Vitreous Vitreous syneresis, vitreous condensations, Posterior vitreous detachment Posterior vitreous detachment, vitreous condensations         Fundus Exam       Right Left   Disc Pink and Sharp, +hyperemia, vascular loops mild Pallor, Sharp rim, central hyperemia, mild PPA   C/D Ratio 0.3 0.2   Macula Blunted foveal reflex, trace cystic change / edema, scattered IRH, +drusen, RPE atrophy Flat, Good foveal reflex, RPE mottling and clumping, Drusen, No heme or edema   Vessels attenuated, Tortuous, old CRVO attenuated, Tortuous   Periphery Attached, mild reticular degeneration, No heme Attached, mild reticular degeneration, No heme           Refraction     Wearing Rx       Sphere Cylinder Axis Add   Right -0.75 +2.00 178 +2.50   Left -0.50 +2.00 170 +2.50           IMAGING AND PROCEDURES  Imaging and Procedures for 04/13/2024  OCT, Retina - OU - Both Eyes       Right Eye Quality was good. Scan locations included subfoveal. Central Foveal Thickness: 209. Progression has been stable. Findings include no SRF, abnormal foveal contour, intraretinal  fluid, outer retinal atrophy (Trace cystic changes / IRF temporal fovea and macula -- slightly increased; SRF stably improved, diffuse retina atrophy).   Left Eye Quality was good. Scan locations included subfoveal. Central Foveal Thickness: 261. Progression has been stable. Findings include normal foveal contour, no IRF, no SRF, retinal drusen .   Notes *Images captured and stored on drive  Diagnosis / Impression:  OD: Trace cystic changes / IRF temporal fovea and macula -- slightly increased; SRF stably improved, diffuse retina atrophy OS: NFP, no IRF/SRF  Clinical management:  See below  Abbreviations: NFP - Normal foveal profile. CME - cystoid macular edema. PED - pigment epithelial detachment. IRF - intraretinal fluid. SRF - subretinal fluid. EZ - ellipsoid zone. ERM - epiretinal membrane. ORA - outer retinal atrophy. ORT - outer retinal tubulation. SRHM - subretinal hyper-reflective material. IRHM - intraretinal hyper-reflective material      Intravitreal Injection, Pharmacologic Agent - OD - Right Eye       Time Out 04/13/2024. 2:33 PM. Confirmed correct patient, procedure, site, and patient consented.   Anesthesia Topical anesthesia was used. Anesthetic medications included Lidocaine  2%, Proparacaine 0.5%.   Procedure Preparation included 5% betadine  to ocular surface, eyelid speculum. A supplied needle was used.   Injection: 1.25 mg Bevacizumab  1.25mg /0.35ml   Route: Intravitreal, Site: Right Eye   NDC: [id number], Lot: [id number], Expiration date: 04/26/2024   Post-op Post injection exam found visual acuity of at least counting fingers. The patient tolerated the procedure well. There were no complications. The patient received written and verbal post procedure care education.            ASSESSMENT/PLAN:    ICD-10-CM   1. Central retinal vein occlusion with macular edema of right eye  H34.8110 OCT, Retina - OU - Both Eyes    Intravitreal Injection, Pharmacologic  Agent - OD - Right Eye    Bevacizumab  (AVASTIN ) SOLN 1.25 mg    2. Intermediate stage nonexudative age-related macular degeneration of both eyes  H35.3132     3. Essential hypertension  I10     4. Hypertensive retinopathy of both eyes  H35.033     5. Pseudophakia of both eyes  Z96.1     6. Dry eyes  H04.123      CRVO w/ CME, OD **delayed f/u -- 8+ wks instead of 6 due to colitis and transportation on 04.29.25**  - previous Dr. Elner pt here for insurance reasons  - history of IVA OD x13 (last one 09.14.23 -- 14 wks) - s/p IVA OD #14 (12.19.23), #15 (01.30.24), #16 (03.12.24), #17 (04.23.24), #18 (01.17.25), #19 (02.27.25), #20 (04.29.25), #21 (06.03.25) - s/p IVE OD #1 (06.04.24), #2 (07.16.24), #3 (08.28.24), #4 (10.15.24), #5 (11.26.24) **h/o increased IRF/edema at 8+ wks on 04.29.25** **h/o increased IRF/edmea at 6 wks on 07.015.25 - BCVA improved to 20/60 from 20/80  - OCT shows OD: Trace cystic changes / IRF temporal fovea and macula -- slightly increased; SRF stably improved, diffuse retina atrophy OS: NFP, no IRF/SRF at 6 weeks  - recommend IVA OD #22 today 07.15.25 with follow up back to 5 weeks  - RBA of procedure discussed, questions answered - IVA informed consent obtained and signed 01.17.25  - see procedure note - Eylea  approved for 2025 -- but funding for Good Days unavailable - f/u 5 wks -- DFE/OCT, possible injection  2. Age related macular degeneration, non-exudative, OU  - mild-intermediate stage - The incidence, anatomy, and pathology of dry AMD, risk of progression, and the AREDS and AREDS 2 studies including smoking risks discussed with patient.  - recommend Amsler grid monitoring  3,4. Hypertensive retinopathy OU - discussed importance of tight BP control - continue to monitor  5. Pseudophakia OU  - s/p CE/IOL OU  - IOLs in good position, doing well  - continue to monitor  6. Dry eyes OU - recommend artificial tears and lubricating ointment as  needed  Ophthalmic Meds Ordered this visit:  Meds ordered this encounter  Medications   Bevacizumab  (AVASTIN ) SOLN 1.25 mg     Return in about 5 weeks (around 05/18/2024) for f/u CRVO OD, DFE, OCT, Possible Injxn.  There are no Patient Instructions on file for this visit.  This document serves as a record of services personally performed by Redell JUDITHANN Hans, MD, PhD. It was created on their behalf by Auston Muzzy, COMT. The creation of this record is the provider's dictation and/or activities during the visit.  Electronically signed by: Auston Muzzy, COMT 04/13/24 8:37 PM  This document serves as a record of services personally performed by Redell JUDITHANN Hans, MD, PhD. It was created on their behalf by Alan PARAS. Delores, OA an ophthalmic technician. The creation of this record is the provider's dictation and/or activities during the visit.    Electronically signed by: Alan PARAS. Delores, OA 04/13/24 8:37 PM  Redell JUDITHANN Hans, M.D., Ph.D. Diseases & Surgery of the Retina and Vitreous [facility]  I have reviewed the above documentation for accuracy and completeness, and I agree with the above. Redell JUDITHANN Hans, M.D., Ph.D. 04/13/24 8:38 PM   Abbreviations: M myopia (nearsighted); A astigmatism; H hyperopia (farsighted); P presbyopia; Mrx spectacle prescription;  CTL contact lenses; OD right eye; OS left  eye; OU both eyes  XT exotropia; ET esotropia; PEK punctate epithelial keratitis; PEE punctate epithelial erosions; DES dry eye syndrome; MGD meibomian gland dysfunction; ATs artificial tears; PFAT's preservative free artificial tears; NSC nuclear sclerotic cataract; PSC posterior subcapsular cataract; ERM epi-retinal membrane; PVD posterior vitreous detachment; RD retinal detachment; DM diabetes mellitus; DR diabetic retinopathy; NPDR non-proliferative diabetic retinopathy; PDR proliferative diabetic retinopathy; CSME clinically significant macular edema; DME diabetic macular  edema; dbh dot blot hemorrhages; CWS cotton wool spot; POAG primary open angle glaucoma; C/D cup-to-disc ratio; HVF humphrey visual field; GVF goldmann visual field; OCT optical coherence tomography; IOP intraocular pressure; BRVO Branch retinal vein occlusion; CRVO central retinal vein occlusion; CRAO central retinal artery occlusion; BRAO branch retinal artery occlusion; RT retinal tear; SB scleral buckle; PPV pars plana vitrectomy; VH Vitreous hemorrhage; PRP panretinal laser photocoagulation; IVK intravitreal kenalog; VMT vitreomacular traction; MH Macular hole;  NVD neovascularization of the disc; NVE neovascularization elsewhere; AREDS age related eye disease study; ARMD age related macular degeneration; POAG primary open angle glaucoma; EBMD epithelial/anterior basement membrane dystrophy; ACIOL anterior chamber intraocular lens; IOL intraocular lens; PCIOL posterior chamber intraocular lens; Phaco/IOL phacoemulsification with intraocular lens placement; PRK photorefractive keratectomy; LASIK laser assisted in situ keratomileusis; HTN hypertension; DM diabetes mellitus; COPD chronic obstructive pulmonary disease

## 2024-04-13 ENCOUNTER — Ambulatory Visit (INDEPENDENT_AMBULATORY_CARE_PROVIDER_SITE_OTHER): Admitting: Ophthalmology

## 2024-04-13 ENCOUNTER — Encounter (INDEPENDENT_AMBULATORY_CARE_PROVIDER_SITE_OTHER): Payer: Self-pay | Admitting: Ophthalmology

## 2024-04-13 DIAGNOSIS — H35033 Hypertensive retinopathy, bilateral: Secondary | ICD-10-CM | POA: Diagnosis not present

## 2024-04-13 DIAGNOSIS — I10 Essential (primary) hypertension: Secondary | ICD-10-CM | POA: Diagnosis not present

## 2024-04-13 DIAGNOSIS — H34811 Central retinal vein occlusion, right eye, with macular edema: Secondary | ICD-10-CM | POA: Diagnosis not present

## 2024-04-13 DIAGNOSIS — H04123 Dry eye syndrome of bilateral lacrimal glands: Secondary | ICD-10-CM

## 2024-04-13 DIAGNOSIS — H353132 Nonexudative age-related macular degeneration, bilateral, intermediate dry stage: Secondary | ICD-10-CM

## 2024-04-13 DIAGNOSIS — Z961 Presence of intraocular lens: Secondary | ICD-10-CM

## 2024-04-13 MED ORDER — BEVACIZUMAB CHEMO INJECTION 1.25MG/0.05ML SYRINGE FOR KALEIDOSCOPE
1.2500 mg | INTRAVITREAL | Status: AC | PRN
  Administered 2024-04-13: 1.25 mg via INTRAVITREAL

## 2024-05-10 ENCOUNTER — Emergency Department (HOSPITAL_COMMUNITY)

## 2024-05-10 ENCOUNTER — Emergency Department (HOSPITAL_COMMUNITY)
Admission: EM | Admit: 2024-05-10 | Discharge: 2024-05-11 | Disposition: A | Discharge status: Home / Self Care | Attending: Emergency Medicine | Admitting: Emergency Medicine

## 2024-05-10 ENCOUNTER — Encounter (HOSPITAL_COMMUNITY): Payer: Self-pay

## 2024-05-10 DIAGNOSIS — S52602A Unspecified fracture of lower end of left ulna, initial encounter for closed fracture: Secondary | ICD-10-CM | POA: Diagnosis not present

## 2024-05-10 DIAGNOSIS — W19XXXA Unspecified fall, initial encounter: Secondary | ICD-10-CM | POA: Diagnosis not present

## 2024-05-10 DIAGNOSIS — T148XXA Other injury of unspecified body region, initial encounter: Secondary | ICD-10-CM | POA: Diagnosis not present

## 2024-05-10 DIAGNOSIS — S42402A Unspecified fracture of lower end of left humerus, initial encounter for closed fracture: Secondary | ICD-10-CM | POA: Diagnosis not present

## 2024-05-10 DIAGNOSIS — S42452A Displaced fracture of lateral condyle of left humerus, initial encounter for closed fracture: Secondary | ICD-10-CM | POA: Diagnosis not present

## 2024-05-10 DIAGNOSIS — S42462A Displaced fracture of medial condyle of left humerus, initial encounter for closed fracture: Secondary | ICD-10-CM | POA: Diagnosis not present

## 2024-05-10 DIAGNOSIS — M2540 Effusion, unspecified joint: Secondary | ICD-10-CM | POA: Diagnosis not present

## 2024-05-10 DIAGNOSIS — W07XXXA Fall from chair, initial encounter: Secondary | ICD-10-CM | POA: Diagnosis not present

## 2024-05-10 DIAGNOSIS — S59902A Unspecified injury of left elbow, initial encounter: Secondary | ICD-10-CM | POA: Diagnosis not present

## 2024-05-10 DIAGNOSIS — S42352A Displaced comminuted fracture of shaft of humerus, left arm, initial encounter for closed fracture: Secondary | ICD-10-CM | POA: Diagnosis not present

## 2024-05-10 NOTE — ED Triage Notes (Signed)
 Pt comes from home by EMS after a fall. Pt was sitting down in a chair to eat her dinner and fell. Pt landed on her left elbow. Elbow is slightly swollen. Pt has been holding her arm to her chest since EMS pick her up. PMS intact but mobility limited due to pain. A&Ox4.

## 2024-05-11 ENCOUNTER — Emergency Department (HOSPITAL_COMMUNITY)

## 2024-05-11 DIAGNOSIS — S42402A Unspecified fracture of lower end of left humerus, initial encounter for closed fracture: Secondary | ICD-10-CM | POA: Diagnosis not present

## 2024-05-11 DIAGNOSIS — S42352A Displaced comminuted fracture of shaft of humerus, left arm, initial encounter for closed fracture: Secondary | ICD-10-CM | POA: Diagnosis not present

## 2024-05-11 DIAGNOSIS — M2540 Effusion, unspecified joint: Secondary | ICD-10-CM | POA: Diagnosis not present

## 2024-05-11 DIAGNOSIS — S52602A Unspecified fracture of lower end of left ulna, initial encounter for closed fracture: Secondary | ICD-10-CM | POA: Diagnosis not present

## 2024-05-11 DIAGNOSIS — S42462A Displaced fracture of medial condyle of left humerus, initial encounter for closed fracture: Secondary | ICD-10-CM | POA: Diagnosis not present

## 2024-05-11 MED ORDER — HYDROCODONE-ACETAMINOPHEN 5-325 MG PO TABS
1.0000 | ORAL_TABLET | Freq: Once | ORAL | Status: AC
  Administered 2024-05-11 (×2): 1 via ORAL
  Filled 2024-05-11: qty 1

## 2024-05-11 NOTE — ED Notes (Signed)
 [facility] been called.

## 2024-05-11 NOTE — ED Notes (Signed)
 Pt was able to talk to her friend Santana on the phone.

## 2024-05-11 NOTE — ED Notes (Signed)
 Delay in splint - no available technician at this time

## 2024-05-11 NOTE — ED Provider Notes (Signed)
 [place] [facility] AT [facility] Provider Note   CSN: [id number] Arrival date & time: 05/10/24  2046     Patient presents with: Fall and Elbow Pain (Left/)   Brenda Reynolds is a 89 y.o. female.   Patient presents to the emergency department for evaluation of left elbow injury.  Patient fell off of a chair tonight and landed on her elbow.  She is complaining of severe pain in the left elbow with decreased movement.  No other injury.       Prior to Admission medications   Medication Sig Start Date End Date Taking? Authorizing Provider  acetaminophen  (TYLENOL ) 500 MG tablet Take 500-1,000 mg by mouth every 6 (six) hours as needed for moderate pain.    [provider]  alendronate (FOSAMAX) 70 MG tablet Take 70 mg by mouth every Tuesday. 04/30/19   [provider]  calcium-vitamin D  (OSCAL WITH D) 500-200 MG-UNIT tablet Take 1 tablet by mouth 3 (three) times daily.    [provider]  colestipol  (COLESTID ) 1 g tablet Take 1 tablet (1 g total) by mouth daily. 07/21/23   Carlan, Chelsea L, NP  diclofenac Sodium (VOLTAREN) 1 % GEL Apply 2 g topically 4 (four) times daily. 06/10/23   [provider]  famotidine  (PEPCID ) 40 MG tablet Take 1 tablet (40 mg total) by mouth every evening. 10/14/23 10/13/24  Eartha Angelia Sieving, MD  folic acid  (FOLVITE ) 400 MCG tablet Take 400 mcg by mouth daily.    [provider]  gabapentin  (NEURONTIN ) 300 MG capsule Take 300 mg by mouth 3 (three) times daily. 04/21/19   [provider]  glucosamine-chondroitin 500-400 MG tablet Take 1 tablet by mouth daily.    [provider]  HYDROcodone -acetaminophen  (NORCO/VICODIN) 5-325 MG tablet Take 1 tablet by mouth 2 (two) times daily as needed for moderate pain.    [provider]  losartan  (COZAAR ) 100 MG tablet Take 100 mg by mouth daily. 05/05/19   [provider]  Multiple Vitamin (MULTIVITAMIN) tablet Take 1  tablet by mouth daily.    [provider]  Omega-3 Fatty Acids (FISH OIL) 1000 MG CAPS Take 2,000 mg by mouth daily.    [provider]  pantoprazole  (PROTONIX ) 40 MG tablet TAKE 1 TABLET BY MOUTH DAILY 01/08/24   Carlan, Chelsea L, NP  vitamin B-12 (CYANOCOBALAMIN ) 500 MCG tablet Take 500 mcg by mouth daily.    [provider]  vitamin C (ASCORBIC ACID) 500 MG tablet Take 1 tablet (500 mg total) by mouth daily.    [provider]    Allergies: Lidocaine     Review of Systems  Updated Vital Signs BP (!) 141/82 (BP Location: Right Arm)   Pulse 73   Temp 97.7 F (36.5 C) (Oral)   Resp 14   Ht 4' 11 (1.499 m)   Wt 48.3 kg   SpO2 97%   BMI 21.51 kg/m   Physical Exam Vitals and nursing note reviewed.  Constitutional:      General: She is not in acute distress.    Appearance: She is well-developed.  HENT:     Head: Normocephalic and atraumatic.     Mouth/Throat:     Mouth: Mucous membranes are moist.  Eyes:     General: Vision grossly intact. Gaze aligned appropriately.     Extraocular Movements: Extraocular movements intact.     Conjunctiva/sclera: Conjunctivae normal.  Cardiovascular:     Rate and Rhythm: Normal rate and regular  rhythm.     Pulses:          Radial pulses are 1+ on the left side.     Heart sounds: Normal heart sounds, S1 normal and S2 normal. No murmur heard.    No friction rub. No gallop.  Pulmonary:     Effort: Pulmonary effort is normal. No respiratory distress.     Breath sounds: Normal breath sounds.  Abdominal:     General: Bowel sounds are normal.     Palpations: Abdomen is soft.     Tenderness: There is no abdominal tenderness. There is no guarding or rebound.     Hernia: No hernia is present.  Musculoskeletal:        General: No swelling.     Left elbow: Swelling and deformity present. Decreased range of motion.     Cervical back: Full passive range of motion without pain, normal range of motion and neck  supple. No spinous process tenderness or muscular tenderness. Normal range of motion.     Right lower leg: No edema.     Left lower leg: No edema.  Skin:    General: Skin is warm and dry.     Capillary Refill: Capillary refill takes less than 2 seconds.     Findings: No ecchymosis, erythema, rash or wound.  Neurological:     General: No focal deficit present.     Mental Status: She is alert and oriented to person, place, and time.     GCS: GCS eye subscore is 4. GCS verbal subscore is 5. GCS motor subscore is 6.     Cranial Nerves: Cranial nerves 2-12 are intact.     Sensory: Sensation is intact.     Motor: Motor function is intact.     Coordination: Coordination is intact.  Psychiatric:        Attention and Perception: Attention normal.        Mood and Affect: Mood normal.        Speech: Speech normal.        Behavior: Behavior normal.     (all labs ordered are listed, but only abnormal results are displayed) Labs Reviewed - No data to display  EKG: None  Radiology: CT Elbow Left Wo Contrast Result Date: 05/11/2024 CLINICAL DATA:  Distal humeral fracture, assess articular involvement EXAM: CT OF THE UPPER LEFT EXTREMITY WITHOUT CONTRAST TECHNIQUE: Multidetector CT imaging of the upper left extremity was performed according to the standard protocol. RADIATION DOSE REDUCTION: This exam was performed according to the departmental dose-optimization program which includes automated exposure control, adjustment of the mA and/or kV according to patient size and/or use of iterative reconstruction technique. COMPARISON:  Plain film from the previous day. FINDINGS: Bones/Joint/Cartilage There is a comminuted fracture of the medial humeral metaphysis distally. The medial distal fracture remains articulated with the ulna but is somewhat tilted medially. The capitellum is well articulated with the radial head. Proximal ulna and proximal radius as visualized are intact. Joint effusion is noted with  elevation of the anterior posterior fat pads. Ligaments Suboptimally assessed by CT. Muscles and Tendons Surrounding musculature appears within normal limits. Soft tissues No soft tissue hematoma is noted. IMPRESSION: Comminuted distal humeral fracture with some impaction at the fracture site. It extends to the articular surface. Associated joint effusion is noted. Capitellum and proximal radius are well articulated. The trochlea appears somewhat tilted at its articulation with the proximal ulna. Electronically Signed   By: Oneil Devonshire M.D.   On:  05/11/2024 02:46   DG Elbow Complete Left Result Date: 05/10/2024 CLINICAL DATA:  Pain, fall. EXAM: LEFT ELBOW - COMPLETE 3+ VIEW COMPARISON:  None Available. FINDINGS: Displaced distal humerus fracture. There is involvement of the medial metaphysis with extension to the articular surface. There is articular displacement. No frank elbow dislocation. Moderate joint effusion is seen. There is soft tissue edema. IMPRESSION: Displaced distal humerus fracture with involvement of the medial metaphysis and articular surface. Electronically Signed   By: Andrea Gasman M.D.   On: 05/10/2024 22:40     Procedures   Medications Ordered in the ED  HYDROcodone -acetaminophen  (NORCO/VICODIN) 5-325 MG per tablet 1 tablet (1 tablet Oral Given 05/11/24 0039)                                    Medical Decision Making Amount and/or Complexity of Data Reviewed Radiology: ordered.  Risk Prescription drug management.   Presents to the emergency department for evaluation of left elbow injury after a fall.  It sounds as though the patient fell from a seated position, landing on her elbow.  Patient with fracture of the distal humerus.  A CT was obtained to further define the fracture.  Fracture has been discussed with Dr. Margrette, on-call for orthopedics.  He has evaluated the images and feels that the patient can be treated with immobilization in a posterior splint,  follow-up with him in the office.     Final diagnoses:  Closed displaced fracture of medial condyle of left humerus, initial encounter    ED Discharge Orders     None          Tennyson Kallen, Lonni PARAS, MD 05/11/24 [phone]

## 2024-05-13 DIAGNOSIS — Z6823 Body mass index (BMI) 23.0-23.9, adult: Secondary | ICD-10-CM | POA: Diagnosis not present

## 2024-05-13 DIAGNOSIS — S52022A Displaced fracture of olecranon process without intraarticular extension of left ulna, initial encounter for closed fracture: Secondary | ICD-10-CM | POA: Diagnosis not present

## 2024-05-18 ENCOUNTER — Encounter (INDEPENDENT_AMBULATORY_CARE_PROVIDER_SITE_OTHER): Admitting: Ophthalmology

## 2024-05-18 DIAGNOSIS — S42402A Unspecified fracture of lower end of left humerus, initial encounter for closed fracture: Secondary | ICD-10-CM | POA: Diagnosis not present

## 2024-05-18 DIAGNOSIS — M25522 Pain in left elbow: Secondary | ICD-10-CM | POA: Diagnosis not present

## 2024-05-20 DIAGNOSIS — S42462A Displaced fracture of medial condyle of left humerus, initial encounter for closed fracture: Secondary | ICD-10-CM | POA: Diagnosis not present

## 2024-05-20 DIAGNOSIS — K219 Gastro-esophageal reflux disease without esophagitis: Secondary | ICD-10-CM | POA: Diagnosis not present

## 2024-05-20 DIAGNOSIS — Z0389 Encounter for observation for other suspected diseases and conditions ruled out: Secondary | ICD-10-CM | POA: Diagnosis not present

## 2024-05-20 DIAGNOSIS — S42402A Unspecified fracture of lower end of left humerus, initial encounter for closed fracture: Secondary | ICD-10-CM | POA: Diagnosis not present

## 2024-05-20 DIAGNOSIS — Z720 Tobacco use: Secondary | ICD-10-CM | POA: Diagnosis not present

## 2024-05-20 DIAGNOSIS — M810 Age-related osteoporosis without current pathological fracture: Secondary | ICD-10-CM | POA: Diagnosis not present

## 2024-05-20 DIAGNOSIS — K529 Noninfective gastroenteritis and colitis, unspecified: Secondary | ICD-10-CM | POA: Diagnosis not present

## 2024-05-20 DIAGNOSIS — R8290 Unspecified abnormal findings in urine: Secondary | ICD-10-CM | POA: Diagnosis not present

## 2024-05-20 DIAGNOSIS — G5622 Lesion of ulnar nerve, left upper limb: Secondary | ICD-10-CM | POA: Diagnosis not present

## 2024-05-20 DIAGNOSIS — W19XXXA Unspecified fall, initial encounter: Secondary | ICD-10-CM | POA: Diagnosis not present

## 2024-05-20 DIAGNOSIS — M1990 Unspecified osteoarthritis, unspecified site: Secondary | ICD-10-CM | POA: Diagnosis not present

## 2024-05-20 DIAGNOSIS — I10 Essential (primary) hypertension: Secondary | ICD-10-CM | POA: Diagnosis not present

## 2024-05-20 DIAGNOSIS — R2689 Other abnormalities of gait and mobility: Secondary | ICD-10-CM | POA: Diagnosis not present

## 2024-05-20 DIAGNOSIS — Z66 Do not resuscitate: Secondary | ICD-10-CM | POA: Diagnosis not present

## 2024-05-20 DIAGNOSIS — Z79899 Other long term (current) drug therapy: Secondary | ICD-10-CM | POA: Diagnosis not present

## 2024-05-21 DIAGNOSIS — K219 Gastro-esophageal reflux disease without esophagitis: Secondary | ICD-10-CM | POA: Diagnosis not present

## 2024-05-21 DIAGNOSIS — R8290 Unspecified abnormal findings in urine: Secondary | ICD-10-CM | POA: Diagnosis not present

## 2024-05-21 DIAGNOSIS — R54 Age-related physical debility: Secondary | ICD-10-CM | POA: Diagnosis not present

## 2024-05-21 DIAGNOSIS — I10 Essential (primary) hypertension: Secondary | ICD-10-CM | POA: Diagnosis not present

## 2024-05-21 DIAGNOSIS — S42402A Unspecified fracture of lower end of left humerus, initial encounter for closed fracture: Secondary | ICD-10-CM | POA: Diagnosis not present

## 2024-05-22 DIAGNOSIS — I10 Essential (primary) hypertension: Secondary | ICD-10-CM | POA: Diagnosis not present

## 2024-05-22 DIAGNOSIS — R8290 Unspecified abnormal findings in urine: Secondary | ICD-10-CM | POA: Diagnosis not present

## 2024-05-22 DIAGNOSIS — R54 Age-related physical debility: Secondary | ICD-10-CM | POA: Diagnosis not present

## 2024-05-22 DIAGNOSIS — S42402A Unspecified fracture of lower end of left humerus, initial encounter for closed fracture: Secondary | ICD-10-CM | POA: Diagnosis not present

## 2024-05-23 DIAGNOSIS — I10 Essential (primary) hypertension: Secondary | ICD-10-CM | POA: Diagnosis not present

## 2024-05-23 DIAGNOSIS — S42402A Unspecified fracture of lower end of left humerus, initial encounter for closed fracture: Secondary | ICD-10-CM | POA: Diagnosis not present

## 2024-05-23 DIAGNOSIS — R54 Age-related physical debility: Secondary | ICD-10-CM | POA: Diagnosis not present

## 2024-05-23 DIAGNOSIS — K219 Gastro-esophageal reflux disease without esophagitis: Secondary | ICD-10-CM | POA: Diagnosis not present

## 2024-05-25 DIAGNOSIS — R197 Diarrhea, unspecified: Secondary | ICD-10-CM | POA: Diagnosis not present

## 2024-05-25 DIAGNOSIS — H353132 Nonexudative age-related macular degeneration, bilateral, intermediate dry stage: Secondary | ICD-10-CM | POA: Diagnosis not present

## 2024-05-25 DIAGNOSIS — R1023 Pelvic and perineal pain bilateral: Secondary | ICD-10-CM | POA: Diagnosis not present

## 2024-05-25 DIAGNOSIS — S42402A Unspecified fracture of lower end of left humerus, initial encounter for closed fracture: Secondary | ICD-10-CM | POA: Diagnosis not present

## 2024-05-25 DIAGNOSIS — Z741 Need for assistance with personal care: Secondary | ICD-10-CM | POA: Diagnosis not present

## 2024-05-25 DIAGNOSIS — R2681 Unsteadiness on feet: Secondary | ICD-10-CM | POA: Diagnosis not present

## 2024-05-25 DIAGNOSIS — G5622 Lesion of ulnar nerve, left upper limb: Secondary | ICD-10-CM | POA: Diagnosis not present

## 2024-05-25 DIAGNOSIS — M62522 Muscle wasting and atrophy, not elsewhere classified, left upper arm: Secondary | ICD-10-CM | POA: Diagnosis not present

## 2024-05-25 DIAGNOSIS — M25522 Pain in left elbow: Secondary | ICD-10-CM | POA: Diagnosis not present

## 2024-05-25 DIAGNOSIS — M6259 Muscle wasting and atrophy, not elsewhere classified, multiple sites: Secondary | ICD-10-CM | POA: Diagnosis not present

## 2024-05-25 DIAGNOSIS — R41841 Cognitive communication deficit: Secondary | ICD-10-CM | POA: Diagnosis not present

## 2024-05-25 DIAGNOSIS — R262 Difficulty in walking, not elsewhere classified: Secondary | ICD-10-CM | POA: Diagnosis not present

## 2024-05-25 DIAGNOSIS — Z79899 Other long term (current) drug therapy: Secondary | ICD-10-CM | POA: Diagnosis not present

## 2024-05-25 DIAGNOSIS — Z23 Encounter for immunization: Secondary | ICD-10-CM | POA: Diagnosis not present

## 2024-05-25 DIAGNOSIS — M6281 Muscle weakness (generalized): Secondary | ICD-10-CM | POA: Diagnosis not present

## 2024-05-25 DIAGNOSIS — Z9181 History of falling: Secondary | ICD-10-CM | POA: Diagnosis not present

## 2024-05-25 DIAGNOSIS — K219 Gastro-esophageal reflux disease without esophagitis: Secondary | ICD-10-CM | POA: Diagnosis not present

## 2024-05-25 DIAGNOSIS — W19XXXA Unspecified fall, initial encounter: Secondary | ICD-10-CM | POA: Diagnosis not present

## 2024-05-25 DIAGNOSIS — Z66 Do not resuscitate: Secondary | ICD-10-CM | POA: Diagnosis not present

## 2024-05-25 DIAGNOSIS — R8290 Unspecified abnormal findings in urine: Secondary | ICD-10-CM | POA: Diagnosis not present

## 2024-05-25 DIAGNOSIS — K529 Noninfective gastroenteritis and colitis, unspecified: Secondary | ICD-10-CM | POA: Diagnosis not present

## 2024-05-25 DIAGNOSIS — M1990 Unspecified osteoarthritis, unspecified site: Secondary | ICD-10-CM | POA: Diagnosis not present

## 2024-05-25 DIAGNOSIS — R54 Age-related physical debility: Secondary | ICD-10-CM | POA: Diagnosis not present

## 2024-05-25 DIAGNOSIS — I10 Essential (primary) hypertension: Secondary | ICD-10-CM | POA: Diagnosis not present

## 2024-05-25 DIAGNOSIS — R2689 Other abnormalities of gait and mobility: Secondary | ICD-10-CM | POA: Diagnosis not present

## 2024-05-25 DIAGNOSIS — R269 Unspecified abnormalities of gait and mobility: Secondary | ICD-10-CM | POA: Diagnosis not present

## 2024-05-25 DIAGNOSIS — S42402D Unspecified fracture of lower end of left humerus, subsequent encounter for fracture with routine healing: Secondary | ICD-10-CM | POA: Diagnosis not present

## 2024-05-25 DIAGNOSIS — R531 Weakness: Secondary | ICD-10-CM | POA: Diagnosis not present

## 2024-05-25 DIAGNOSIS — M810 Age-related osteoporosis without current pathological fracture: Secondary | ICD-10-CM | POA: Diagnosis not present

## 2024-05-25 DIAGNOSIS — R1310 Dysphagia, unspecified: Secondary | ICD-10-CM | POA: Diagnosis not present

## 2024-05-25 NOTE — Discharge Summary (Signed)
 Discharge Summary  Admit date: 05/20/2024  Discharge date and time: 05/25/24  Discharge to:  Skilled nursing facility  Discharge Service: [facility])  Discharge Attending Physician: Manus Dawn Stetler, DO  Discharge  Diagnoses: Left distal humerus fracture  Secondary Diagnosis: Principal Problem:   Closed fracture of distal end of left humerus with routine healing (POA: Not Applicable) Active Problems:   Chronic diarrhea (POA: Yes)   GERD (gastroesophageal reflux disease) (POA: Yes)   Hypertension (POA: Yes)   Intermediate stage nonexudative age-related macular degeneration of both eyes (POA: Yes)   Osteoporosis (POA: Yes)   Abnormal finding on urinalysis (POA: Yes)   Age-related physical debility (POA: Yes) Resolved Problems:   * No resolved hospital problems. *   OR Procedures:     Ancillary Procedures: no procedures  Discharge Day Services:  Subjective  No acute events overnight. Pain Controlled. No fever or chills.  Objective Patient Vitals for the past 8 hrs:  BP Temp Temp src Pulse Resp SpO2  05/25/24 0810 128/69 36.3 C (97.3 F) Oral 79 18 99 %   No intake/output data recorded.  Physical Exam: General:  Alert and oriented, No acute distress Extremity/wound location: left upper  Incision: None  Neurovascular: Distal affected extremity is well perfused. Intact sensation to the affected hand in the median, ulnar, and radial nerve distribution. Blunted sensation to ulnar nerve distribution of hand and fingers  Hospital Course: Patient is a pleasant 89 year old female who was admitted to the hospital on 05/20/2024 with inability to take care of herself at home after sustaining a displaced intra-articular left distal humerus fracture.  We have previously elected to manage the fracture nonoperatively.  She has been noted to have numbness/tingling in her hand and ulnar 3 digits consistent with the ulnar nerve distribution which we have also been monitoring.   Patient remained stable during her hospital stay and was discharged to [facility] rehabilitation facility on 05/25/2024.  Condition at Discharge: Improved Discharge Medications:    Your Medication List     STOP taking these medications    HYDROcodone -acetaminophen  5-325 mg per tablet Commonly known as: NORCO       START taking these medications    oxyCODONE  5 MG immediate release tablet Commonly known as: ROXICODONE  Take 1 tablet (5 mg total) by mouth every four (4) hours as needed.       CHANGE how you take these medications    acetaminophen  500 MG tablet Commonly known as: TYLENOL  EXTRA STRENGTH Take 2 tablets (1,000 mg total) by mouth every six (6) hours as needed for up to 14 days. What changed:  how much to take when to take this reasons to take this       CONTINUE taking these medications    alendronate 70 MG tablet Commonly known as: FOSAMAX Take 1 tablet (70 mg total) by mouth every seven (7) days.   colestipol  1 gram tablet Commonly known as: COLESTID  Take 1 tablet (1 g total) by mouth.   cyanocobalamin  500 MCG tablet Take 1 tablet (500 mcg total) by mouth daily.   diclofenac sodium 1 % gel Commonly known as: VOLTAREN Apply 2 g topically.   famotidine  40 MG tablet Commonly known as: PEPCID  Take 1 tablet (40 mg total) by mouth every evening. TAKE 1 TABLET (40 MG TOTAL) BY MOUTH EVERY EVENING.   folic acid  400 MCG tablet Commonly known as: FOLVITE  Take 1 tablet (400 mcg total) by mouth daily.   furosemide 20 MG tablet Commonly known as:  LASIX TAKE 1 TABLET BY MOUTH TWICE A WEEK AS NEEDED FOR LEG SWELLING   gabapentin  300 MG capsule Commonly known as: NEURONTIN  Take 1 capsule (300 mg total) by mouth Three (3) times a day.   glucosamine su 2KCl-chondroit 500-400 mg Tab Take 1 tablet by mouth daily.   losartan  100 MG tablet Commonly known as: COZAAR  Take 1 tablet (100 mg total) by mouth daily.   pantoprazole  40 MG tablet Commonly  known as: Protonix  Take 1 tablet (40 mg total) by mouth daily.   spironolactone 25 MG tablet Commonly known as: ALDACTONE Take 1 tablet (25 mg total) by mouth daily.        Pending Test Results:   Discharge Instructions:   Other Instructions     Discharge instructions     Discharge instructions: - Non-weightbearing to the operative extremity. Ok for range of motion to elbow as tolerated - Ice for pain and swelling (20 minutes on, 20 minutes off). Do not place ice directly on the skin. Keep a cloth barrier between skin and ice to avoid cold-induced injury. Check the skin periodically. - Take medication as prescribed - If you have a splint/dressing and it gets wet or comes off, do not attempt to reapply or dry, call the office so you can be seen as soon as possible - Monitor for signs and symptoms of infection (redness, swelling, drainage, fevers, or chills) and call the office or go to the ER if they occur - Call the office or go to the ER if there are any signs/symptoms of compartment syndrome (firm/tight extremity, loss of color to extremity, increasing numbness/tingling, cool extremity with no capillary refill - as demonstrated, pain that is not controlled with pain medication)  - Follow-up in clinic in approximately 2 weeks. Call the office to schedule or confirm your appointment - Call the office [phone]) or Dr. Heyward [phone]) with any questions or concerns     Follow Up instructions and Outpatient Referrals    Discharge instructions      Appointments which have been scheduled for you    Jun 01, 2024 3:00 PM Return Elbow with Manus Dasie Heyward, DO [facility] at [facility] [place]) [address] [place] [address] [phone]

## 2024-06-01 DIAGNOSIS — S42402A Unspecified fracture of lower end of left humerus, initial encounter for closed fracture: Secondary | ICD-10-CM | POA: Diagnosis not present

## 2024-06-01 NOTE — Care Plan (Signed)
 [facility] [address] [place] [address] [phone]  Physician Medicare Certification Post-Hospital SNF Care  Patient Name: Brenda Reynolds MRN: [id number] Medicare Number: [id number] - (Medicare Advantage) Attending Provider: Orpha Yancey Skeens, MD Admitting Provider: Yancey Skeens Orpha, MD Admission Date: 05/25/2024  I certify that post-hospital SNF care is required at a skilled level on a daily basis on behalf of the above named patient that, as a practical matter, can only be provided in a SNF.  The SNF care is needed for a condition for which the individual received inpatient care in a participating hospital.  Additional justification (if applicable): Admitted to rehab following hospital stay for closed fracture of distal end of left humerus.  Skilled Rehab: yes. If yes, as documented per rehab plan of care.  Provider's Name: Dr. Yancey Orpha Date: 06/01/2024 11:30 PM

## 2024-06-01 NOTE — Nursing Note (Signed)
 PO meds tolerated without difficulty. Fluids encouraged and accepted well for hydration. Resting quietly with eyes closed, resp even and non labored. HOB elevated and call bell in reach.

## 2024-06-02 NOTE — LTC Provider Review (Signed)
   SPEECH THERAPY TREATMENT NOTE    Patient Name:  Brenda Reynolds      Medical Record Number: [id number]  Date of Birth: 1930/06/23 Location: [facility] [place]   PT Precautions: Falls (NWB LUE),   OT Precautions: Falls, Brace (Ice for pain and swelling (20 minutes on, 20 minutes off), L arm sling), NWB LUE ok for ROM to elbow as tolerated, fall risk SLP Precautions: Falls,    Subjective: Cooperative    Cognitive Communication Treatment: Pt was upright in chair at start of session. ST consulted with pt, friend, Child psychotherapist and case manager concerning pts PLOF, goals, current progress and recommendations.  I attest that I have reviewed the above information. SignedBETHA Ulanda Moats, SLP 06/02/2024

## 2024-06-20 DIAGNOSIS — K219 Gastro-esophageal reflux disease without esophagitis: Secondary | ICD-10-CM | POA: Diagnosis not present

## 2024-06-20 DIAGNOSIS — R531 Weakness: Secondary | ICD-10-CM | POA: Diagnosis not present

## 2024-06-20 DIAGNOSIS — S42402A Unspecified fracture of lower end of left humerus, initial encounter for closed fracture: Secondary | ICD-10-CM | POA: Diagnosis not present

## 2024-06-20 DIAGNOSIS — R197 Diarrhea, unspecified: Secondary | ICD-10-CM | POA: Diagnosis not present

## 2024-06-24 DIAGNOSIS — S42402A Unspecified fracture of lower end of left humerus, initial encounter for closed fracture: Secondary | ICD-10-CM | POA: Diagnosis not present

## 2024-06-24 DIAGNOSIS — M25522 Pain in left elbow: Secondary | ICD-10-CM | POA: Diagnosis not present

## 2024-07-15 ENCOUNTER — Other Ambulatory Visit (INDEPENDENT_AMBULATORY_CARE_PROVIDER_SITE_OTHER): Payer: Self-pay | Admitting: Gastroenterology

## 2024-07-15 NOTE — Telephone Encounter (Signed)
 07/21/2023 Ov Note per Chelsea, Recommended to stop famotidine , start omeprazole  40 mg daily, chewing precautions, continue colestipol . I have called to see if the patient is still taking this, no answer. No future appointment here is scheduled.

## 2024-07-16 NOTE — Telephone Encounter (Signed)
 Tried calling again no answer.

## 2024-07-16 NOTE — Telephone Encounter (Signed)
 I have tried reaching out to the patient to see if she is still taking this, as at her last ov the patient was told to stop Famotidine  and start Omeprazole  daily. Please have patient reach out to the office regarding this. Thanks,   Oliwia Berzins [phone]

## 2024-07-20 DIAGNOSIS — K219 Gastro-esophageal reflux disease without esophagitis: Secondary | ICD-10-CM | POA: Diagnosis not present

## 2024-07-20 DIAGNOSIS — S42402A Unspecified fracture of lower end of left humerus, initial encounter for closed fracture: Secondary | ICD-10-CM | POA: Diagnosis not present

## 2024-07-20 DIAGNOSIS — R197 Diarrhea, unspecified: Secondary | ICD-10-CM | POA: Diagnosis not present

## 2024-07-20 DIAGNOSIS — R531 Weakness: Secondary | ICD-10-CM | POA: Diagnosis not present

## 2024-08-04 ENCOUNTER — Encounter (INDEPENDENT_AMBULATORY_CARE_PROVIDER_SITE_OTHER): Payer: Self-pay | Admitting: Gastroenterology

## 2024-09-01 NOTE — Care Plan (Signed)
 Care Plan Meeting 09/01/2024 12:03 PM    Patient Name: Brenda Reynolds MRN:  [id number] Admit Date/Time: 05/25/2024 11:00 AM Date of Birth:  [birth year] Sex:  Female Room/Bed: W155/W155-01   Diagnosis ICD-10-CM Associated Orders  1. Hypertension, unspecified type  I10 spironolactone (ALDACTONE) tablet 25 mg    valsartan (DIOVAN) tablet 80 mg    Comprehensive Metabolic Panel    2. Other closed displaced fracture of distal end of left humerus with routine healing, subsequent encounter  S42.492D acetaminophen  (TYLENOL ) tablet 1,000 mg    diclofenac sodium (VOLTAREN) 1 % gel 2 g    glucosamine-chondroit-vit C-Mn 500-400 mg cap 1 capsule    acetaminophen  (TYLENOL ) tablet 650 mg    acetaminophen  (TYLENOL ) tablet 650 mg    HYDROcodone -acetaminophen  (NORCO) 5-325 mg per tablet 1 tablet    3. Gastroesophageal reflux disease, unspecified whether esophagitis present  K21.9 famotidine  (PEPCID ) tablet 40 mg    omeprazole  (PriLOSEC) capsule 40 mg    4. Edema, unspecified type  R60.9 furosemide (LASIX) tablet 20 mg    5. Osteoporosis, unspecified osteoporosis type, unspecified pathological fracture presence  M81.0 alendronate (FOSAMAX) tablet 70 mg    folic acid  (FOLVITE ) tablet 400 mcg    cyanocobalamin  (vitamin B-12) tablet 1,000 mcg    6. Screening examination for pulmonary tuberculosis  Z11.1 tuberculin PPD injection    tuberculin PPD injection    7. Chronic diarrhea  K52.9 colestipol  (COLESTID ) tablet 1 g    8. Neuropathy  G62.9 gabapentin  (NEURONTIN ) capsule 300 mg    9. Preventive measure  Z29.9     10. Need for immunization against respiratory syncytial virus  Z29.11 RSV vaccine (MRESVIA) 50 mcg/0.5 mL intramuscular injection    11. Flu vaccine need  Z23 flu vaccine TS 2025-26 (Fluarix,Flulaval,Fluzone) (6mos up)(PF) 45 mcg(15mcgx3)/0.5 mL IM syringe      Anticipated discharge date:   Team  Updates -----------------------------------------------------------------------------------------------------  Care plan meeting with resident, Ubaldo Cunning and IDT team. Brenda Reynolds she has no concerns and that everyone is taking good care of her. She [place] that they are working on getting me Medicaid because my Doctor said I need to stay here because I don't have anyone to take care of me at home.  Mr Cunning had no concerns about her care and discussed the need for  LTC. He understands that there isn't a LTC bed at this time but Brenda Reynolds Is first on the list if a bed becomes available. He is aware of other options and Facilities in the area. -----------------------------------------------------------------------------------------------------   -----------------------------------------------------------------------------------------------------   -----------------------------------------------------------------------------------------------------   -----------------------------------------------------------------------------------------------------   -----------------------------------------------------------------------------------------------------   -----------------------------------------------------------------------------------------------------    ----------------------------------------------------------------------------------------------------   -----------------------------------------------------------------------------------------------------   -----------------------------------------------------------------------------------------------------   -----------------------------------------------------------------------------------------------------  Resident and/or family goals were reviewed.  Resident and/or family questions: see above  Updates discussed: n/a

## 2024-09-07 NOTE — Nursing Note (Signed)
 PO meds tolerated. Fluids accepted well for hydration. Alert and verbal. Able to make needs known. Resting quietly in bed, call bell in reach.

## 2024-09-27 NOTE — Progress Notes (Signed)
 History of present illness:   Patient is a pleasant 89 year old female who comes in today for follow-up of her nonoperatively managed and displaced left distal humerus fracture-dislocation sustained on 05/11/2024 as a result of a fall from a chair.  She is 4.5 months out from her injury and was last seen in our office on 06/24/2024 at which point she was having some ongoing discomfort and stiffness to her elbow as well as findings consistent with ulnar neuropathy.  Her x-rays demonstrated slight interval healing of her displaced fracture and we advised her to follow-up with us  in 6 weeks for reevaluation.  Today, patient reports that she is overall doing well with seemingly mild discomfort to her elbow.  She continues to reside at the [facility] rehabilitation facility.  Past Medical History[1]  Past Surgical History[2]  Current Medications[3]  Allergies[4]  Family History[5]  Social History   Occupational History   Not on file  Tobacco Use   Smoking status: Former    Types: Cigarettes    Start date: 05/2016   Smokeless tobacco: Never  Vaping Use   Vaping status: Never Used  Substance and Sexual Activity   Alcohol use: Not Currently   Drug use: Not Currently   Sexual activity: Not Currently    Partners: Male    10-point ROS negative unless as stated above.  Physical exam: There were no vitals taken for this visit.:   GEN:  NAD; Sitting comfortably in office, pleasant. HEAD: Normocephalic, atraumatic. EYES: Sclera non-icteric EOMI. ENT: Ears externally normal. No nasal congestion.  CARDIAC: Regular rate, distal pulses intact. LUNGS: Normal respiratory effort. No retractions or cough noted. NEURO: No focal neurologic deficits noted. PSYCH: Appropriate affect. Alert and oriented. DERM: Warm, dry. Appropriate turgor.  MSK:  LUE: Deformity to elbow consistent with displaced fracture.  No gross motion at the fracture site.  Elbow range of motion lacking 40 degrees of  extension to 110 degrees of flexion with no pain in the central arc.  Supination to 70 degrees and pronation to 80 degrees.  Interossei and first webspace atrophy.  Decreased sensation to the ulnar 2 digits though patient reports this is subjectively improved.  Imaging: Date: 09/27/24 Images: Left elbow Findings: Imaging of the left elbow redemonstrates patient's markedly displaced comminuted intra-articular distal humerus fracture. There has been no appreciable change in alignment.  There appears to be some slight interval callus formation present.  Assessment/plan: Patient is 4.5 months out from her nonoperative manage left distal humerus fracture.  She is overall doing seemingly well with some mild intermittent discomfort to the elbow as well as some limitations in her motion/function.  Otherwise, her x-rays remain stable with interval healing.  At this point, she is okay to continue to progress her activities as tolerated and follow-up with us  on an as needed basis. If the patient develops any new or worsening signs/symptoms or otherwise has any questions, she is advised to reach out to us  to discuss.  Gerlene Gobble, DO [facility] at [facility] [address] P: [phone] F: [phone]       [1] Past Medical History: Diagnosis Date   Abnormal finding on urinalysis 05/21/2024   Arthritis    Closed bicondylar fracture of distal end of left humerus with routine healing 05/25/2024   Closed fracture of distal end of left humerus with routine healing 05/21/2024   Closed left hip fracture    (CMS-HCC)    Closed right hip fracture    (  CMS-HCC)    Colitis    Flu vaccine need 06/04/2024   GERD (gastroesophageal reflux disease)    Hypertension    Need for immunization against respiratory syncytial virus 06/01/2024   Osteoarthritis    Scoliosis   [2] Past Surgical History: Procedure Laterality Date   HIP FRACTURE SURGERY Left    [3] Current Outpatient Medications  Medication Sig Dispense Refill   alendronate (FOSAMAX) 70 MG tablet Take 1 tablet (70 mg total) by mouth every seven (7) days.     colestipol  (COLESTID ) 1 gram tablet Take 1 tablet (1 g total) by mouth.     cyanocobalamin  500 MCG tablet Take 1 tablet (500 mcg total) by mouth daily.     diclofenac sodium (VOLTAREN) 1 % gel Apply 2 g topically.     famotidine  (PEPCID ) 40 MG tablet Take 1 tablet (40 mg total) by mouth every evening. TAKE 1 TABLET (40 MG TOTAL) BY MOUTH EVERY EVENING.     folic acid  (FOLVITE ) 400 MCG tablet Take 1 tablet (400 mcg total) by mouth daily.     furosemide (LASIX) 20 MG tablet TAKE 1 TABLET BY MOUTH TWICE A WEEK AS NEEDED FOR LEG SWELLING     gabapentin  (NEURONTIN ) 300 MG capsule Take 1 capsule (300 mg total) by mouth Three (3) times a day.     glucosamine su 2KCl-chondroit 500-400 mg Tab Take 1 tablet by mouth daily.     HYDROcodone -acetaminophen  (NORCO) 5-325 mg per tablet Take 1 tablet by mouth every six (6) hours as needed for pain. 15 tablet 0   losartan  (COZAAR ) 100 MG tablet Take 1 tablet (100 mg total) by mouth daily. (Patient not taking: Reported on 06/24/2024)     oxyCODONE  (ROXICODONE ) 5 MG immediate release tablet Take 1 tablet (5 mg total) by mouth every four (4) hours as needed. 30 tablet 0   pantoprazole  (PROTONIX ) 40 MG tablet Take 1 tablet (40 mg total) by mouth daily. (Patient not taking: Reported on 06/24/2024)     spironolactone (ALDACTONE) 25 MG tablet Take 1 tablet (25 mg total) by mouth daily.     valsartan (DIOVAN) 160 MG tablet Take 1 tablet (160 mg total) by mouth daily.     No current facility-administered medications for this visit.   Facility-Administered Medications Ordered in Other Visits  Medication Dose Route Frequency Provider Last Rate Last Admin   [MAR Hold - Suspended Admission] acetaminophen  (TYLENOL ) tablet 650 mg  650 mg Oral Q6H PRN Hasanaj, Xaje Adem, MD   650 mg at 09/12/24  1815   [MAR Hold - Suspended Admission] alendronate (FOSAMAX) tablet 70 mg  70 mg Oral Weekly Hasanaj, Xaje Adem, MD   70 mg at 09/24/24 0813   [MAR Hold - Suspended Admission] colestipol  (COLESTID ) tablet 1 g  1 g Oral Daily Hasanaj, Yancey Skeens, MD   1 g at 09/27/24 0756   [MAR Hold - Suspended Admission] cyanocobalamin  (vitamin B-12) tablet 1,000 mcg  1,000 mcg Oral Daily Hasanaj, Xaje Adem, MD   1,000 mcg at 09/27/24 0800   [MAR Hold - Suspended Admission] diclofenac sodium (VOLTAREN) 1 % gel 2 g  2 g Topical BID Hasanaj, Xaje Adem, MD   2 g at 09/27/24 0756   [MAR Hold - Suspended Admission] famotidine  (PEPCID ) tablet 40 mg  40 mg Oral QPM Hasanaj, Xaje Adem, MD   40 mg at 09/26/24 1805   [MAR Hold - Suspended Admission] folic acid  (FOLVITE ) tablet 400 mcg  400 mcg Oral Daily Hasanaj, Xaje Adem,  MD   400 mcg at 09/27/24 0756   [MAR Hold - Suspended Admission] furosemide (LASIX) tablet 20 mg  20 mg Oral PRN Hasanaj, Xaje Adem, MD       [MAR Hold - Suspended Admission] gabapentin  (NEURONTIN ) capsule 300 mg  300 mg Oral TID Hasanaj, Xaje Adem, MD   300 mg at 09/27/24 0756   [MAR Hold - Suspended Admission] glucosamine-chondroit-vit C-Mn 500-400 mg cap 1 capsule  1 capsule Oral Daily Hasanaj, Yancey Skeens, MD   1 capsule at 09/27/24 0756   [MAR Hold - Suspended Admission] omeprazole  (PriLOSEC) capsule 40 mg  40 mg Oral Daily Hasanaj, Xaje Adem, MD   40 mg at 09/27/24 0522   [MAR Hold - Suspended Admission] spironolactone (ALDACTONE) tablet 25 mg  25 mg Oral Daily Hasanaj, Xaje Adem, MD   25 mg at 09/27/24 0756   [MAR Hold - Suspended Admission] valsartan (DIOVAN) tablet 80 mg  80 mg Oral Daily Hasanaj, Xaje Adem, MD   80 mg at 09/27/24 0800  [4] No Known Allergies [5] No family history on file.

## 2024-09-27 NOTE — Nursing Note (Signed)
 PO meds tolerated. Fluids accepted well for hydration. Alert and verbal, able to make needs known. No c/o pain voiced. Resting quietly in bed, call bell in reach.

## 2024-09-28 NOTE — Nursing Note (Signed)
 Pt went to Dr. Heyward office and returned with Brenda Reynolds.
